# Patient Record
Sex: Female | Born: 1950 | Race: White | Hispanic: No | Marital: Married | Smoking: Former smoker
Health system: Southern US, Community
[De-identification: ages and names within clinical notes are randomized; demographics above are authoritative.]

## PROBLEM LIST (undated history)

## (undated) DIAGNOSIS — B49 Unspecified mycosis: Secondary | ICD-10-CM

## (undated) DIAGNOSIS — E039 Hypothyroidism, unspecified: Secondary | ICD-10-CM

## (undated) DIAGNOSIS — S199XXA Unspecified injury of neck, initial encounter: Secondary | ICD-10-CM

## (undated) DIAGNOSIS — M1990 Unspecified osteoarthritis, unspecified site: Secondary | ICD-10-CM

## (undated) DIAGNOSIS — F419 Anxiety disorder, unspecified: Secondary | ICD-10-CM

## (undated) DIAGNOSIS — J45909 Unspecified asthma, uncomplicated: Secondary | ICD-10-CM

## (undated) DIAGNOSIS — Z808 Family history of malignant neoplasm of other organs or systems: Secondary | ICD-10-CM

## (undated) DIAGNOSIS — F329 Major depressive disorder, single episode, unspecified: Secondary | ICD-10-CM

## (undated) DIAGNOSIS — E785 Hyperlipidemia, unspecified: Secondary | ICD-10-CM

## (undated) DIAGNOSIS — D899 Disorder involving the immune mechanism, unspecified: Secondary | ICD-10-CM

## (undated) DIAGNOSIS — Z800 Family history of malignant neoplasm of digestive organs: Secondary | ICD-10-CM

## (undated) DIAGNOSIS — Z803 Family history of malignant neoplasm of breast: Secondary | ICD-10-CM

## (undated) DIAGNOSIS — I499 Cardiac arrhythmia, unspecified: Secondary | ICD-10-CM

## (undated) DIAGNOSIS — C50919 Malignant neoplasm of unspecified site of unspecified female breast: Secondary | ICD-10-CM

## (undated) DIAGNOSIS — Z973 Presence of spectacles and contact lenses: Secondary | ICD-10-CM

## (undated) DIAGNOSIS — F32A Depression, unspecified: Secondary | ICD-10-CM

## (undated) HISTORY — DX: Unspecified mycosis: B49

## (undated) HISTORY — DX: Unspecified osteoarthritis, unspecified site: M19.90

## (undated) HISTORY — DX: Hyperlipidemia, unspecified: E78.5

## (undated) HISTORY — DX: Family history of malignant neoplasm of other organs or systems: Z80.8

## (undated) HISTORY — PX: OOPHORECTOMY: SHX86

## (undated) HISTORY — DX: Family history of malignant neoplasm of digestive organs: Z80.0

## (undated) HISTORY — DX: Family history of malignant neoplasm of breast: Z80.3

## (undated) HISTORY — DX: Malignant neoplasm of unspecified site of unspecified female breast: C50.919

## (undated) HISTORY — PX: TONSILLECTOMY AND ADENOIDECTOMY: SUR1326

## (undated) HISTORY — DX: Disorder involving the immune mechanism, unspecified: D89.9

---

## 1973-11-09 HISTORY — PX: CHOLECYSTECTOMY: SHX55

## 1981-11-09 HISTORY — PX: APPENDECTOMY: SHX54

## 2002-01-25 ENCOUNTER — Other Ambulatory Visit: Admission: RE | Admit: 2002-01-25 | Discharge: 2002-01-25 | Payer: Self-pay | Admitting: Obstetrics and Gynecology

## 2003-07-13 ENCOUNTER — Other Ambulatory Visit: Admission: RE | Admit: 2003-07-13 | Discharge: 2003-07-13 | Payer: Self-pay | Admitting: Obstetrics and Gynecology

## 2009-03-29 ENCOUNTER — Ambulatory Visit: Payer: Self-pay | Admitting: Gastroenterology

## 2009-04-05 ENCOUNTER — Encounter: Payer: Self-pay | Admitting: Gastroenterology

## 2009-04-05 ENCOUNTER — Ambulatory Visit: Payer: Self-pay | Admitting: Gastroenterology

## 2009-04-09 ENCOUNTER — Encounter: Payer: Self-pay | Admitting: Gastroenterology

## 2011-06-26 ENCOUNTER — Other Ambulatory Visit: Payer: Self-pay | Admitting: Family Medicine

## 2011-06-26 ENCOUNTER — Ambulatory Visit
Admission: RE | Admit: 2011-06-26 | Discharge: 2011-06-26 | Disposition: A | Discharge status: Home / Self Care | Payer: No Typology Code available for payment source | Source: Ambulatory Visit | Attending: Internal Medicine | Admitting: Internal Medicine

## 2011-06-26 DIAGNOSIS — T148XXA Other injury of unspecified body region, initial encounter: Secondary | ICD-10-CM

## 2012-11-09 DIAGNOSIS — Z85828 Personal history of other malignant neoplasm of skin: Secondary | ICD-10-CM

## 2012-11-09 HISTORY — DX: Personal history of other malignant neoplasm of skin: Z85.828

## 2013-01-25 ENCOUNTER — Other Ambulatory Visit: Payer: Self-pay | Admitting: Dermatology

## 2013-04-25 ENCOUNTER — Other Ambulatory Visit: Payer: Self-pay | Admitting: Internal Medicine

## 2013-04-25 ENCOUNTER — Ambulatory Visit
Admission: RE | Admit: 2013-04-25 | Discharge: 2013-04-25 | Disposition: A | Discharge status: Home / Self Care | Payer: No Typology Code available for payment source | Source: Ambulatory Visit | Attending: Internal Medicine | Admitting: Internal Medicine

## 2013-04-25 DIAGNOSIS — M545 Low back pain, unspecified: Secondary | ICD-10-CM

## 2013-11-01 ENCOUNTER — Ambulatory Visit (INDEPENDENT_AMBULATORY_CARE_PROVIDER_SITE_OTHER): Payer: BC Managed Care – PPO | Admitting: Family Medicine

## 2013-11-01 ENCOUNTER — Encounter: Payer: Self-pay | Admitting: Family Medicine

## 2013-11-01 ENCOUNTER — Other Ambulatory Visit (INDEPENDENT_AMBULATORY_CARE_PROVIDER_SITE_OTHER): Payer: BC Managed Care – PPO

## 2013-11-01 VITALS — BP 98/62 | HR 54 | Ht 68.0 in | Wt 131.0 lb

## 2013-11-01 DIAGNOSIS — M25569 Pain in unspecified knee: Secondary | ICD-10-CM

## 2013-11-01 DIAGNOSIS — S79919A Unspecified injury of unspecified hip, initial encounter: Secondary | ICD-10-CM

## 2013-11-01 DIAGNOSIS — S76302A Unspecified injury of muscle, fascia and tendon of the posterior muscle group at thigh level, left thigh, initial encounter: Secondary | ICD-10-CM | POA: Insufficient documentation

## 2013-11-01 DIAGNOSIS — M9981 Other biomechanical lesions of cervical region: Secondary | ICD-10-CM

## 2013-11-01 DIAGNOSIS — M999 Biomechanical lesion, unspecified: Secondary | ICD-10-CM | POA: Insufficient documentation

## 2013-11-01 DIAGNOSIS — M25562 Pain in left knee: Secondary | ICD-10-CM

## 2013-11-01 NOTE — Assessment & Plan Note (Signed)
Decision today to treat with OMT was based on Physical Exam  After verbal consent patient was treated with HVLA, ME techniques in cervical, thoracic, and lumbar areas  Patient tolerated the procedure well with improvement in symptoms  Patient given exercises, stretches and lifestyle modifications  See medications in patient instructions if given  Patient will follow up in 6 weeks 

## 2013-11-01 NOTE — Assessment & Plan Note (Signed)
Discussed with patient at great length. Patient will do more of a home exercise program. We are going to focus on Askling exercises. Discuss strengthening hip abductor's as well the  Discuss compression and ice protocol Discuss crosstraining with biking to help with quadricep strength. Patient followup on an as-needed basis.

## 2013-11-01 NOTE — Assessment & Plan Note (Signed)
Decision today to treat with OMT was based on Physical Exam  After verbal consent patient was treated with HVLA, ME techniques in cervical, thoracic, and lumbar areas  Patient tolerated the procedure well with improvement in symptoms  Patient given exercises, stretches and lifestyle modifications  See medications in patient instructions if given  Patient will follow up in 6 weeks

## 2013-11-01 NOTE — Patient Instructions (Signed)
Great to see you!!!! Tell everyone hi! Try askling exercises for your hamstring.  Compression of knee sleeve could help while running. [email or web address] medium knee sleeve. Wear with activity and 30 minutes afterward.  Ice 20 minutes within first hour after activity Continue aleve.  Work on hip abductor exercises.  Your sister is the drill sargent.  See me when you need me Happy New Year!

## 2013-11-01 NOTE — Progress Notes (Signed)
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 Patient states that she started running again and started to have some mild discomfort at the end of different 5K races. Patient has been 3 and has been improving her time. Patient is also increase her mileage per week. Patient has not been doing as much weight training secondary to this. Patient states the pain is mostly in the posterior aspect of the knee and did have a very sharp sensation when it first immediately occurred. Patient denies any swelling, denies any radiation of pain or any numbness. Patient does state that she can have mechanical symptoms such as clicking but this seems to be nonpainful. Denies weakness of the lower externally. Patient puts the discomfort as 5/10.  I have seen the patient previously in other offices for osteopathic manipulation. Patient would like to be evaluated. 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 He should has undergone multiple different treatments including the most recent of prolotherapy. Patient states that this has helped mostly. Does have some mild discomfort of the lower back after running. Has been doing multiple natural herbs.   Past medical, surgical, family and social history reviewed. Medications reviewed all in the electronic medical record.   Review of Systems: No headache, visual changes, nausea, vomiting, diarrhea, constipation, dizziness, abdominal pain, skin rash, fevers, chills, night sweats, weight loss, swollen lymph nodes, body aches, joint swelling, muscle aches, chest pain, shortness of breath, mood changes.   Objective:    Blood pressure 98/62, pulse 54, weight 131 lb (59.421 kg), SpO2 97.00%.   General: No apparent distress alert and oriented x3 mood and affect normal, dressed appropriately.  HEENT: Pupils equal,  extraocular movements intact Respiratory: Patient's speak in full sentences and does not appear short of breath Cardiovascular: No lower extremity edema, non tender, no erythema Skin: Warm dry intact with no signs of infection or rash on extremities or on axial skeleton. Abdomen: Soft nontender Neuro: Cranial nerves II through XII are intact, neurovascularly intact in all extremities with 2+ DTRs and 2+ pulses. Lymph: No lymphadenopathy of posterior or anterior cervical chain or axillae bilaterally.  Gait running gait shows that patient's left knee does cross midline showing hip abductor weakening. MSK: Non tender with full range of motion and good stability and symmetric strength and tone of shoulders, elbows, wrist, hip, and ankles bilaterally.  Knee: Left Normal to inspection with no erythema or effusion or obvious bony abnormalities. Palpation normal with no warmth, joint line tenderness, patellar tenderness, or condyle tenderness. Patient does have minimal tenderness over the medial hamstring tendon insertion ROM full in flexion and extension and lower leg rotation. Ligaments with solid consistent endpoints including ACL, PCL, LCL, MCL. Negative Mcmurray's, Apley's, and Thessalonian tests. painful patellar compression. Patellar glide with moderate crepitus. Patellar and quadriceps tendons unremarkable. Hamstring and quadriceps strength is normal. Patient though does have some mild atrophy of the VMO bilaterally Contralateral knee unremarkable  MSK US performed of: Right knee This study was ordered, performed, and interpreted by Terrilee Files D.O.  Knee: All structures visualized. anterolateral, posteromedial, and posterolateral menisci unremarkable without tearing, fraying, effusion, or displacement. Anterior medial meniscus does have a degenerative tear with mild displacement Patellar Tendon unremarkable on long and transverse views without effusion. No abnormality of prepatellar  bursa. LCL and MCL unremarkable on long and transverse views.  No abnormality of origin of medial or lateral head of the gastrocnemius. Distal hamstring on the medial aspect of does have some mild hypoechoic changes and possibly some scar tissue formation that seems to be resolving. There is increased Doppler flow in this area.  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

## 2015-06-12 ENCOUNTER — Encounter: Payer: Self-pay | Admitting: Gastroenterology

## 2016-09-09 DIAGNOSIS — E538 Deficiency of other specified B group vitamins: Secondary | ICD-10-CM | POA: Diagnosis not present

## 2016-09-09 DIAGNOSIS — Z79890 Hormone replacement therapy (postmenopausal): Secondary | ICD-10-CM | POA: Diagnosis not present

## 2016-09-09 DIAGNOSIS — E782 Mixed hyperlipidemia: Secondary | ICD-10-CM | POA: Diagnosis not present

## 2016-09-09 DIAGNOSIS — Z79899 Other long term (current) drug therapy: Secondary | ICD-10-CM | POA: Diagnosis not present

## 2016-09-09 DIAGNOSIS — E039 Hypothyroidism, unspecified: Secondary | ICD-10-CM | POA: Diagnosis not present

## 2016-09-09 DIAGNOSIS — E559 Vitamin D deficiency, unspecified: Secondary | ICD-10-CM | POA: Diagnosis not present

## 2016-09-16 DIAGNOSIS — E039 Hypothyroidism, unspecified: Secondary | ICD-10-CM | POA: Diagnosis not present

## 2016-09-16 DIAGNOSIS — E559 Vitamin D deficiency, unspecified: Secondary | ICD-10-CM | POA: Diagnosis not present

## 2016-09-16 DIAGNOSIS — E782 Mixed hyperlipidemia: Secondary | ICD-10-CM | POA: Diagnosis not present

## 2016-09-16 DIAGNOSIS — E538 Deficiency of other specified B group vitamins: Secondary | ICD-10-CM | POA: Diagnosis not present

## 2016-09-16 DIAGNOSIS — Z5181 Encounter for therapeutic drug level monitoring: Secondary | ICD-10-CM | POA: Diagnosis not present

## 2016-11-03 DIAGNOSIS — C44519 Basal cell carcinoma of skin of other part of trunk: Secondary | ICD-10-CM | POA: Diagnosis not present

## 2016-11-03 DIAGNOSIS — D225 Melanocytic nevi of trunk: Secondary | ICD-10-CM | POA: Diagnosis not present

## 2016-11-03 DIAGNOSIS — D485 Neoplasm of uncertain behavior of skin: Secondary | ICD-10-CM | POA: Diagnosis not present

## 2016-11-03 DIAGNOSIS — D1800 Hemangioma unspecified site: Secondary | ICD-10-CM | POA: Diagnosis not present

## 2016-11-03 DIAGNOSIS — Z85828 Personal history of other malignant neoplasm of skin: Secondary | ICD-10-CM | POA: Diagnosis not present

## 2016-11-03 DIAGNOSIS — Z23 Encounter for immunization: Secondary | ICD-10-CM | POA: Diagnosis not present

## 2016-11-03 DIAGNOSIS — L821 Other seborrheic keratosis: Secondary | ICD-10-CM | POA: Diagnosis not present

## 2016-11-04 DIAGNOSIS — G479 Sleep disorder, unspecified: Secondary | ICD-10-CM | POA: Diagnosis not present

## 2016-11-04 DIAGNOSIS — R5383 Other fatigue: Secondary | ICD-10-CM | POA: Diagnosis not present

## 2016-11-04 DIAGNOSIS — R5381 Other malaise: Secondary | ICD-10-CM | POA: Diagnosis not present

## 2016-11-04 DIAGNOSIS — K5900 Constipation, unspecified: Secondary | ICD-10-CM | POA: Diagnosis not present

## 2016-11-04 DIAGNOSIS — E619 Deficiency of nutrient element, unspecified: Secondary | ICD-10-CM | POA: Diagnosis not present

## 2016-11-04 DIAGNOSIS — K589 Irritable bowel syndrome without diarrhea: Secondary | ICD-10-CM | POA: Diagnosis not present

## 2016-11-04 DIAGNOSIS — K5229 Other allergic and dietetic gastroenteritis and colitis: Secondary | ICD-10-CM | POA: Diagnosis not present

## 2016-11-05 DIAGNOSIS — Z77120 Contact with and (suspected) exposure to mold (toxic): Secondary | ICD-10-CM | POA: Diagnosis not present

## 2016-11-05 DIAGNOSIS — F419 Anxiety disorder, unspecified: Secondary | ICD-10-CM | POA: Diagnosis not present

## 2016-11-05 DIAGNOSIS — G479 Sleep disorder, unspecified: Secondary | ICD-10-CM | POA: Diagnosis not present

## 2016-11-05 DIAGNOSIS — J329 Chronic sinusitis, unspecified: Secondary | ICD-10-CM | POA: Diagnosis not present

## 2016-11-06 DIAGNOSIS — F419 Anxiety disorder, unspecified: Secondary | ICD-10-CM | POA: Diagnosis not present

## 2016-11-06 DIAGNOSIS — J329 Chronic sinusitis, unspecified: Secondary | ICD-10-CM | POA: Diagnosis not present

## 2016-11-06 DIAGNOSIS — Z77120 Contact with and (suspected) exposure to mold (toxic): Secondary | ICD-10-CM | POA: Diagnosis not present

## 2016-11-06 DIAGNOSIS — G479 Sleep disorder, unspecified: Secondary | ICD-10-CM | POA: Diagnosis not present

## 2016-11-09 DIAGNOSIS — Z8619 Personal history of other infectious and parasitic diseases: Secondary | ICD-10-CM

## 2016-11-09 DIAGNOSIS — B49 Unspecified mycosis: Secondary | ICD-10-CM

## 2016-11-09 HISTORY — DX: Personal history of other infectious and parasitic diseases: Z86.19

## 2016-11-09 HISTORY — DX: Unspecified mycosis: B49

## 2016-11-24 NOTE — Progress Notes (Deleted)
  [name] [address] [place], [place] [address] Phone: [phone] Subjective:    I'm seeing this patient by the request  of:    CC:   [id number]  Whitney Jokela Md is a 66 y.o. female coming in with complaint of ***     Past Medical History:  Diagnosis Date  . Arthritis    Past Surgical History:  Procedure Laterality Date  . APPENDECTOMY  1983  . CHOLECYSTECTOMY  1975  . TONSILLECTOMY AND ADENOIDECTOMY     Social History   Social History  . Marital status: Divorced    Spouse name: N/A  . Number of children: N/A  . Years of education: N/A   Social History Main Topics  . Smoking status: Former Research scientist (life sciences)  . Smokeless tobacco: Never Used  . Alcohol use Yes  . Drug use: No  . Sexual activity: Not on file   Other Topics Concern  . Not on file   Social History Narrative  . No narrative on file   Allergies  Allergen Reactions  . Sulfonamide Derivatives     REACTION: rash   Family History  Problem Relation Age of Onset  . Cancer Mother   . Hyperlipidemia Mother   . Cancer Father   . Hyperlipidemia Father   . Birth defects Maternal Grandmother   . Birth defects Paternal Grandmother     Past medical history, social, surgical and family history all reviewed in electronic medical record.  No pertanent information unless stated regarding to the chief complaint.   Review of Systems:Review of systems updated and as accurate as of 11/24/16  No headache, visual changes, nausea, vomiting, diarrhea, constipation, dizziness, abdominal pain, skin rash, fevers, chills, night sweats, weight loss, swollen lymph nodes, body aches, joint swelling, muscle aches, chest pain, shortness of breath, mood changes.   Objective  There were no vitals taken for this visit. Systems examined below as of 11/24/16   General: No apparent distress alert and oriented x3 mood and affect normal, dressed appropriately.  HEENT: Pupils equal, extraocular  movements intact  Respiratory: Patient's speak in full sentences and does not appear short of breath  Cardiovascular: No lower extremity edema, non tender, no erythema  Skin: Warm dry intact with no signs of infection or rash on extremities or on axial skeleton.  Abdomen: Soft nontender  Neuro: Cranial nerves II through XII are intact, neurovascularly intact in all extremities with 2+ DTRs and 2+ pulses.  Lymph: No lymphadenopathy of posterior or anterior cervical chain or axillae bilaterally.  Gait normal with good balance and coordination.  MSK:  Non tender with full range of motion and good stability and symmetric strength and tone of shoulders, elbows, wrist, hip, knee and ankles bilaterally.     Impression and Recommendations:     This case required medical decision making of moderate complexity.      Note: This dictation was prepared with Dragon dictation along with smaller phrase technology. Any transcriptional errors that result from this process are unintentional.

## 2016-11-25 ENCOUNTER — Telehealth: Payer: Self-pay | Admitting: Family Medicine

## 2016-11-25 ENCOUNTER — Ambulatory Visit: Payer: Self-pay | Admitting: Family Medicine

## 2016-11-25 NOTE — Telephone Encounter (Signed)
Can we work in sooner?  Had to reschedule b/c of weather to next wed.

## 2016-11-27 NOTE — Telephone Encounter (Signed)
Unfortunately we do not have any sooner openings.
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
## 2016-12-02 ENCOUNTER — Encounter: Payer: Self-pay | Admitting: Family Medicine

## 2016-12-02 ENCOUNTER — Ambulatory Visit: Payer: Self-pay | Admitting: Family Medicine

## 2016-12-02 ENCOUNTER — Ambulatory Visit (INDEPENDENT_AMBULATORY_CARE_PROVIDER_SITE_OTHER): Payer: PPO | Admitting: Family Medicine

## 2016-12-02 VITALS — BP 110/78 | HR 59 | Wt 124.0 lb

## 2016-12-02 DIAGNOSIS — M9982 Other biomechanical lesions of thoracic region: Secondary | ICD-10-CM

## 2016-12-02 DIAGNOSIS — M999 Biomechanical lesion, unspecified: Secondary | ICD-10-CM

## 2016-12-02 DIAGNOSIS — M545 Low back pain, unspecified: Secondary | ICD-10-CM | POA: Insufficient documentation

## 2016-12-02 DIAGNOSIS — G8929 Other chronic pain: Secondary | ICD-10-CM

## 2016-12-02 NOTE — Patient Instructions (Addendum)
GREAT to see you! xrays look good.  Have a great trip Exercises 3 times a week.  Turmeric 500mg  twice daily  Tart cherry extract any dose at night could help as well.  Keep being active and take some time for yourself.  See me again in 4 weeks.

## 2016-12-02 NOTE — Assessment & Plan Note (Signed)
Decision today to treat with OMT was based on Physical Exam  After verbal consent patient was treated with HVLA, ME, FPR techniques in cervical, thoracic, lumbar and sacral areas  Patient tolerated the procedure well with improvement in symptoms  Patient given exercises, stretches and lifestyle modifications  See medications in patient instructions if given  Patient will follow up in 4 weeks 

## 2016-12-02 NOTE — Assessment & Plan Note (Signed)
Patient is a more low back pain. We discussed icing regimen and home exercises. We discussed which activities to do a which was to avoid. Patient will start home exercises and work with athletic trainer to learn him in greater detail. Patient has responded very well to a supine manipulation. We discussed other things regular. Follow-up with me again in 4 weeks.

## 2016-12-21 DIAGNOSIS — C44519 Basal cell carcinoma of skin of other part of trunk: Secondary | ICD-10-CM | POA: Diagnosis not present

## 2017-01-06 ENCOUNTER — Other Ambulatory Visit (INDEPENDENT_AMBULATORY_CARE_PROVIDER_SITE_OTHER): Payer: PPO

## 2017-01-06 ENCOUNTER — Encounter: Payer: Self-pay | Admitting: Family Medicine

## 2017-01-06 ENCOUNTER — Ambulatory Visit (INDEPENDENT_AMBULATORY_CARE_PROVIDER_SITE_OTHER): Payer: PPO | Admitting: Family Medicine

## 2017-01-06 VITALS — BP 102/60 | HR 52 | Ht 68.0 in | Wt 128.0 lb

## 2017-01-06 DIAGNOSIS — M999 Biomechanical lesion, unspecified: Secondary | ICD-10-CM

## 2017-01-06 DIAGNOSIS — G8929 Other chronic pain: Secondary | ICD-10-CM

## 2017-01-06 DIAGNOSIS — E038 Other specified hypothyroidism: Secondary | ICD-10-CM

## 2017-01-06 DIAGNOSIS — M545 Low back pain, unspecified: Secondary | ICD-10-CM

## 2017-01-06 LAB — FERRITIN: FERRITIN: 48.5 ng/mL (ref 10.0–291.0)

## 2017-01-06 LAB — T3, FREE: T3 FREE: 2.4 pg/mL (ref 2.3–4.2)

## 2017-01-06 LAB — T4, FREE: Free T4: 0.63 ng/dL (ref 0.60–1.60)

## 2017-01-06 LAB — TSH: TSH: 0.5 u[IU]/mL (ref 0.35–4.50)

## 2017-01-06 NOTE — Patient Instructions (Signed)
Good to see you  Work on the hip flexor especially after running Keep up with my exercises 3 times a week.  We ill get labs today downstairs.  Keep it up  See me again in 4 weeks.
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
## 2017-01-06 NOTE — Assessment & Plan Note (Signed)
Patient does have tenderness to palpation and appears palmar musculature of the lumbar spine. Patient responded very well to manipulation again. Patient is having some chronic pain. Labs ordered today. We'll make sure that patient's thyroid is within normal limits. We discussed icing regimen. Patient will come back and see me again 4 weeks.

## 2017-01-06 NOTE — Assessment & Plan Note (Signed)
Decision today to treat with OMT was based on Physical Exam  After verbal consent patient was treated with HVLA, ME, FPR techniques in cervical, thoracic, lumbar and sacral areas  Patient tolerated the procedure well with improvement in symptoms  Patient given exercises, stretches and lifestyle modifications  See medications in patient instructions if given  Patient will follow up in 4 weeks 

## 2017-01-09 LAB — T3, REVERSE: T3, Reverse: 10 ng/dL (ref 8–25)

## 2017-01-11 NOTE — Progress Notes (Unsigned)
Labs look perfect

## 2017-02-02 NOTE — Progress Notes (Signed)
[name] [place], [place] [address] Phone: [phone] Subjective:     CC: low back and neck pain f/u   [id number]  Whitney Bubolz Md is a 66 y.o. female coming in with complaint of Low back and neck pain. Doing relatively well with conservative therapy. Continue to work on the hip flexors. States his long as she does the stretches he seems to do relatively better. Continues to be active. Continues to increase her running.       Past Medical History:  Diagnosis Date  . Arthritis    Past Surgical History:  Procedure Laterality Date  . APPENDECTOMY  1983  . CHOLECYSTECTOMY  1975  . TONSILLECTOMY AND ADENOIDECTOMY     Social History   Social History  . Marital status: Divorced    Spouse name: N/A  . Number of children: N/A  . Years of education: N/A   Social History Main Topics  . Smoking status: Former Research scientist (life sciences)  . Smokeless tobacco: Never Used  . Alcohol use Yes  . Drug use: No  . Sexual activity: Not Asked   Other Topics Concern  . None   Social History Narrative  . None   Allergies  Allergen Reactions  . Sulfonamide Derivatives     REACTION: rash   Family History  Problem Relation Age of Onset  . Cancer Mother   . Hyperlipidemia Mother   . Cancer Father   . Hyperlipidemia Father   . Birth defects Maternal Grandmother   . Birth defects Paternal Grandmother     Past medical history, social, surgical and family history all reviewed in electronic medical record.  No pertanent information unless stated regarding to the chief complaint.   Review of Systems: No headache, visual changes, nausea, vomiting, diarrhea, constipation, dizziness, abdominal pain, skin rash, fevers, chills, night sweats, weight loss, swollen lymph nodes, body aches, joint swelling, muscle aches, chest pain, shortness of breath, mood changes.    Objective  Blood pressure 96/62, pulse (!) 55, height 5\' 8"  (1.727 m), weight 127 lb  (57.6 kg), SpO2 98 %.    Systems examined below as of 02/03/17 General: NAD A&O x3 mood, affect normal  HEENT: Pupils equal, extraocular movements intact no nystagmus Respiratory: not short of breath at rest or with speaking Cardiovascular: No lower extremity edema, non tender Skin: Warm dry intact with no signs of infection or rash on extremities or on axial skeleton. Abdomen: Soft nontender, no masses Neuro: Cranial nerves  intact, neurovascularly intact in all extremities with 2+ DTRs and 2+ pulses. Lymph: No lymphadenopathy appreciated today  Gait normal with good balance and coordination.  MSK: Non tender with full range of motion and good stability and symmetric strength and tone of shoulders, elbows, wrist,  knee hips and ankles bilaterally.   Back Exam:  Inspection: Unremarkable  Motion: Flexion 40 deg, Extension 15 deg, Side Bending to 25 deg bilaterally,  Rotation to 25 deg bilaterally  SLR laying: Negative  XSLR laying: Negative  Palpable tenderness: Increasing tenderness and flexors bilaterally FABER: Positive right. Sensory change: Gross sensation intact to all lumbar and sacral dermatomes.  Reflexes: 2+ at both patellar tendons, 2+ at achilles tendons, Babinski's downgoing.  Strength at foot  Plantar-flexion: 5/5 Dorsi-flexion: 5/5 Eversion: 5/5 Inversion: 5/5  Leg strength  Quad: 5/5 Hamstring: 5/5 Hip flexor: 5/5 Hip abductors: 4/5 but symmetric. Gait unremarkable.  Neck: Inspection unremarkable. No palpable stepoffs. Negative Spurling's maneuver. Continue mild limitation in range of motion  in all planes Grip strength and sensation normal in bilateral hands Strength good C4 to T1 distribution No sensory change to C4 to T1 Negative Hoffman sign bilaterally Reflexes normal  Osteopathic findings Cervical C2 flexed rotated and side bent right C6 flexed rotated and side bent left T3 extended rotated and side bent right inhaled third rib T9 extended rotated and  side bent left L3 flexed rotated and side bent right Sacrum right on right     Impression and Recommendations:     This case required medical decision making of moderate complexity.      Note: This dictation was prepared with Dragon dictation along with smaller phrase technology. Any transcriptional errors that result from this process are unintentional.

## 2017-02-03 ENCOUNTER — Ambulatory Visit (INDEPENDENT_AMBULATORY_CARE_PROVIDER_SITE_OTHER): Payer: PPO | Admitting: Family Medicine

## 2017-02-03 ENCOUNTER — Encounter: Payer: Self-pay | Admitting: Family Medicine

## 2017-02-03 VITALS — BP 96/62 | HR 55 | Ht 68.0 in | Wt 127.0 lb

## 2017-02-03 DIAGNOSIS — M545 Low back pain: Secondary | ICD-10-CM | POA: Diagnosis not present

## 2017-02-03 DIAGNOSIS — G8929 Other chronic pain: Secondary | ICD-10-CM

## 2017-02-03 DIAGNOSIS — M999 Biomechanical lesion, unspecified: Secondary | ICD-10-CM | POA: Diagnosis not present

## 2017-02-03 NOTE — Assessment & Plan Note (Signed)
Stable overall. I do not feel that we can decrease frequency of office visits at this time. Increase exercises to focus more on the core stability. We discussed objective is to do in which ones to avoid. Patient should do fairly well we'll still. Follow-up again in 4 weeks

## 2017-02-03 NOTE — Assessment & Plan Note (Signed)
Decision today to treat with OMT was based on Physical Exam  After verbal consent patient was treated with HVLA, ME, FPR techniques in cervical, thoracic, lumbar and sacral areas  Patient tolerated the procedure well with improvement in symptoms  Patient given exercises, stretches and lifestyle modifications  See medications in patient instructions if given  Patient will follow up in 4 weeks 

## 2017-02-03 NOTE — Patient Instructions (Signed)
Good to see you  Alvera Singh is your friend.  Try to watch the diet  Stay active Exercises is great  Bromelain and tart cherry extract  See me again in 4 weeks

## 2017-03-09 ENCOUNTER — Other Ambulatory Visit: Payer: Self-pay

## 2017-03-09 DIAGNOSIS — M8580 Other specified disorders of bone density and structure, unspecified site: Secondary | ICD-10-CM

## 2017-03-10 ENCOUNTER — Ambulatory Visit (INDEPENDENT_AMBULATORY_CARE_PROVIDER_SITE_OTHER)
Admission: RE | Admit: 2017-03-10 | Discharge: 2017-03-10 | Disposition: A | Discharge status: Home / Self Care | Payer: PPO | Source: Ambulatory Visit | Attending: Family Medicine | Admitting: Family Medicine

## 2017-03-10 ENCOUNTER — Ambulatory Visit (INDEPENDENT_AMBULATORY_CARE_PROVIDER_SITE_OTHER): Payer: PPO | Admitting: Family Medicine

## 2017-03-10 ENCOUNTER — Encounter: Payer: Self-pay | Admitting: Family Medicine

## 2017-03-10 ENCOUNTER — Other Ambulatory Visit (INDEPENDENT_AMBULATORY_CARE_PROVIDER_SITE_OTHER): Payer: PPO

## 2017-03-10 VITALS — BP 98/54 | HR 74 | Resp 16 | Ht 66.0 in | Wt 127.1 lb

## 2017-03-10 DIAGNOSIS — M8580 Other specified disorders of bone density and structure, unspecified site: Secondary | ICD-10-CM | POA: Diagnosis not present

## 2017-03-10 DIAGNOSIS — M999 Biomechanical lesion, unspecified: Secondary | ICD-10-CM | POA: Diagnosis not present

## 2017-03-10 DIAGNOSIS — M85861 Other specified disorders of bone density and structure, right lower leg: Secondary | ICD-10-CM | POA: Diagnosis not present

## 2017-03-10 DIAGNOSIS — G8929 Other chronic pain: Secondary | ICD-10-CM | POA: Diagnosis not present

## 2017-03-10 DIAGNOSIS — M545 Low back pain, unspecified: Secondary | ICD-10-CM

## 2017-03-10 LAB — VITAMIN D 25 HYDROXY (VIT D DEFICIENCY, FRACTURES): VITD: 95.4 ng/mL (ref 30.00–100.00)

## 2017-03-10 NOTE — Progress Notes (Signed)
Pre-visit discussion using our clinic review tool. No additional management support is needed unless otherwise documented below in the visit note.  

## 2017-03-10 NOTE — Progress Notes (Signed)
[name] [place], [place] [address] Phone: [phone] Subjective:     CC: low back and neck pain f/u   [id number]  Whitney Antrobus Md is a 66 y.o. female coming in with complaint of Low back and neck pain. Patient was doing well. Patient though unfortunately no longer was following her diet and stopped exercises for the last 3 weeks. Has noticed increasing back pain, and multiple other joint pains. Asian states that it is a dull, throbbing aching sensation overall. Patient is having worsening symptoms that does not allow her to be active. Patient denies any fevers chills or any abnormal weight loss.        Past Medical History:  Diagnosis Date  . Arthritis    Past Surgical History:  Procedure Laterality Date  . APPENDECTOMY  1983  . CHOLECYSTECTOMY  1975  . TONSILLECTOMY AND ADENOIDECTOMY     Social History   Social History  . Marital status: Divorced    Spouse name: N/A  . Number of children: N/A  . Years of education: N/A   Social History Main Topics  . Smoking status: Former Research scientist (life sciences)  . Smokeless tobacco: Never Used  . Alcohol use Yes  . Drug use: No  . Sexual activity: Not Asked   Other Topics Concern  . None   Social History Narrative  . None   Allergies  Allergen Reactions  . Sulfonamide Derivatives     REACTION: rash   Family History  Problem Relation Age of Onset  . Cancer Mother   . Hyperlipidemia Mother   . Cancer Father   . Hyperlipidemia Father   . Birth defects Maternal Grandmother   . Birth defects Paternal Grandmother     Past medical history, social, surgical and family history all reviewed in electronic medical record.  No pertanent information unless stated regarding to the chief complaint.   Review of Systems: No headache, visual changes, nausea, vomiting, diarrhea, constipation, dizziness, abdominal pain, skin rash, fevers, chills, night sweats, weight loss, swollen lymph nodes,  chest pain, shortness of breath, mood changes.  Positive muscle and joint pain  Objective  Blood pressure (!) 98/54, pulse 74, resp. rate 16, height 5\' 6"  (1.676 m), weight 127 lb 2 oz (57.7 kg), SpO2 98 %.    Systems examined below as of 03/10/17 General: NAD A&O x3 mood, affect normal  HEENT: Pupils equal, extraocular movements intact no nystagmus Respiratory: not short of breath at rest or with speaking Cardiovascular: No lower extremity edema, non tender Skin: Warm dry intact with no signs of infection or rash on extremities or on axial skeleton. Abdomen: Soft nontender, no masses Neuro: Cranial nerves  intact, neurovascularly intact in all extremities with 2+ DTRs and 2+ pulses. Lymph: No lymphadenopathy appreciated today  Gait normal with good balance and coordination.  MSK: Non tender with full range of motion and good stability and symmetric strength and tone of shoulders, elbows, wrist,  knee hips and ankles bilaterally.   Back Exam:  Inspection: Unremarkable  Motion: Flexion 40 deg, Extension 25 deg, Side Bending to 40 deg bilaterally,  Rotation to 40 deg bilaterally  SLR laying: Negative  XSLR laying: Negative  Palpable tenderness: Tender to palpation in the paraspinal musculature of the lumbar spine. Very minimal scoliosis noted. FABER: negative. Sensory change: Gross sensation intact to all lumbar and sacral dermatomes.  Reflexes: 2+ at both patellar tendons, 2+ at achilles tendons, Babinski's downgoing.  Strength at foot  Plantar-flexion:  5/5 Dorsi-flexion: 5/5 Eversion: 5/5 Inversion: 5/5  Leg strength  Quad: 5/5 Hamstring: 5/5 Hip flexor: 5/5 Hip abductors: 5/5  Gait unremarkable.   Neck: Inspection unremarkable. No palpable stepoffs. Negative Spurling's maneuver. Full neck range of motion patient does have some pain though in the terminal Grip strength and sensation normal in bilateral hands Strength good C4 to T1 distribution No sensory change to C4 to  T1 Negative Hoffman sign bilaterally Reflexes normal  Osteopathic findings Cervical C2 flexed rotated and side bent right C6 flexed rotated and side bent left T3 extended rotated and side bent right inhaled third rib T9 extended rotated and side bent left L3 flexed rotated and side bent right Sacrum right on right     Impression and Recommendations:     This case required medical decision making of moderate complexity.      Note: This dictation was prepared with Dragon dictation along with smaller phrase technology. Any transcriptional errors that result from this process are unintentional.

## 2017-03-10 NOTE — Patient Instructions (Signed)
awesome overall  Have fun in [place] See me again in 4 weeks.

## 2017-03-10 NOTE — Assessment & Plan Note (Signed)
Decision today to treat with OMT was based on Physical Exam  After verbal consent patient was treated with HVLA, ME, FPR techniques in cervical, thoracic, lumbar and sacral areas  Patient tolerated the procedure well with improvement in symptoms  Patient given exercises, stretches and lifestyle modifications  See medications in patient instructions if given  Patient will follow up in 4 weeks 

## 2017-03-10 NOTE — Assessment & Plan Note (Signed)
Patient low back pain seems to be stable but some mild worsening at this point. Patient has not been doing the exercises as regularly. Patient will start working on a more regular basis. She will be traveling over the course the next several days and then likely will cause some exacerbation and patient given some guidance. Patient though otherwise follow-up with me again in 4 weeks

## 2017-03-26 ENCOUNTER — Encounter: Payer: Self-pay | Admitting: Family Medicine

## 2017-03-26 ENCOUNTER — Ambulatory Visit (INDEPENDENT_AMBULATORY_CARE_PROVIDER_SITE_OTHER): Payer: PPO | Admitting: Family Medicine

## 2017-03-26 VITALS — BP 92/60 | HR 53 | Ht 66.0 in | Wt 124.0 lb

## 2017-03-26 DIAGNOSIS — M545 Low back pain: Secondary | ICD-10-CM | POA: Diagnosis not present

## 2017-03-26 DIAGNOSIS — G8929 Other chronic pain: Secondary | ICD-10-CM | POA: Diagnosis not present

## 2017-03-26 DIAGNOSIS — M999 Biomechanical lesion, unspecified: Secondary | ICD-10-CM

## 2017-03-26 NOTE — Assessment & Plan Note (Signed)
Patient does have some low back pain. We discussed core strengthening. Patient has been running and we will have her decrease and for some time. We discussed stretching on a more regular basis. We discussed hydration. Patient will follow-up with me again in 2-4 weeks.

## 2017-03-26 NOTE — Assessment & Plan Note (Signed)
Decision today to treat with OMT was based on Physical Exam  After verbal consent patient was treated with HVLA, ME, FPR techniques in cervical, thoracic, lumbar and sacral areas  Patient tolerated the procedure well with improvement in symptoms  Patient given exercises, stretches and lifestyle modifications  See medications in patient instructions if given  Patient will follow up in 4 weeks I feel that this was just an exacerbation. Patient will make no significant changes in management. We did discuss different things to avoid.

## 2017-03-26 NOTE — Progress Notes (Signed)
[name] [place], [place] [address] Phone: [phone] Subjective:     CC: low back and neck pain f/u  [id number]  Whitney Delisi Md is a 66 y.o. female coming in with complaint of Low back and neck pain. Patient is having worsening symptoms. Was doing a lot more activity outdoors recently. Patient was in a flexed city good position for a long amount of time or he thinks that this increased the amount of discomfort in the neck and back. Patient denies any radiation to any of the extremities. Was very uncomfortable even at night.        Past Medical History:  Diagnosis Date  . Arthritis    Past Surgical History:  Procedure Laterality Date  . APPENDECTOMY  1983  . CHOLECYSTECTOMY  1975  . TONSILLECTOMY AND ADENOIDECTOMY     Social History   Social History  . Marital status: Divorced    Spouse name: N/A  . Number of children: N/A  . Years of education: N/A   Social History Main Topics  . Smoking status: Former Research scientist (life sciences)  . Smokeless tobacco: Never Used  . Alcohol use Yes  . Drug use: No  . Sexual activity: Not Asked   Other Topics Concern  . None   Social History Narrative  . None   Allergies  Allergen Reactions  . Sulfonamide Derivatives     REACTION: rash   Family History  Problem Relation Age of Onset  . Cancer Mother   . Hyperlipidemia Mother   . Cancer Father   . Hyperlipidemia Father   . Birth defects Maternal Grandmother   . Birth defects Paternal Grandmother     Past medical history, social, surgical and family history all reviewed in electronic medical record.  No pertanent information unless stated regarding to the chief complaint.   Review of Systems: No headache, visual changes, nausea, vomiting, diarrhea, constipation, dizziness, abdominal pain, skin rash, fevers, chills, night sweats, weight loss, swollen lymph nodes, chest pain, shortness of breath, mood changes.  Positive muscle and joint  pain  Objective  Blood pressure 92/60, pulse (!) 53, height 5\' 6"  (1.676 m), weight 124 lb (56.2 kg), SpO2 97 %.  Systems examined below as of 03/26/17 General: NAD A&O x3 mood, affect normal  HEENT: Pupils equal, extraocular movements intact no nystagmus Respiratory: not short of breath at rest or with speaking Cardiovascular: No lower extremity edema, non tender Skin: Warm dry intact with no signs of infection or rash on extremities or on axial skeleton. Abdomen: Soft nontender, no masses Neuro: Cranial nerves  intact, neurovascularly intact in all extremities with 2+ DTRs and 2+ pulses. Lymph: No lymphadenopathy appreciated today  Gait normal with good balance and coordination.  MSK: Non tender with full range of motion and good stability and symmetric strength and tone of shoulders, elbows, wrist,  knee hips and ankles bilaterally.  Mild arthritic changes of multiple joints Back Exam:  Inspection: Unremarkable  Motion: Flexion 40 deg, Extension 15 deg, Side Bending to 40 deg bilaterally,  Rotation to 30 deg bilaterally increasing tightness from previous exam SLR laying: Negative  XSLR laying: Negative  Palpable tenderness: Continued tenderness in the paraspinal musculature mostly in the lumbosacral area. Seems to radiate to the quadratus lumborum FABER: negative. Sensory change: Gross sensation intact to all lumbar and sacral dermatomes.  Reflexes: 2+ at both patellar tendons, 2+ at achilles tendons, Babinski's downgoing.  Strength at foot  5 out of 5 strength  and symmetric   Neck: Inspection unremarkable. No palpable stepoffs. Negative Spurling's maneuver. Limited right-sided rotation by 5 Grip strength and sensation normal in bilateral hands Strength good C4 to T1 distribution No sensory change to C4 to T1 Negative Hoffman sign bilaterally Reflexes normal  Osteopathic findings Cervical C2 flexed rotated and side bent right C4 flexed rotated and side bent left T3  extended rotated and side bent right inhaled third rib T9 extended rotated and side bent left L3 flexed rotated and side bent right Sacrum left on left       Impression and Recommendations:     This case required medical decision making of moderate complexity.      Note: This dictation was prepared with Dragon dictation along with smaller phrase technology. Any transcriptional errors that result from this process are unintentional.

## 2017-04-06 ENCOUNTER — Encounter: Payer: Self-pay | Admitting: Family Medicine

## 2017-04-06 ENCOUNTER — Ambulatory Visit (INDEPENDENT_AMBULATORY_CARE_PROVIDER_SITE_OTHER): Payer: PPO | Admitting: Family Medicine

## 2017-04-06 VITALS — BP 106/68 | HR 57 | Ht 66.0 in | Wt 125.0 lb

## 2017-04-06 DIAGNOSIS — G8929 Other chronic pain: Secondary | ICD-10-CM | POA: Diagnosis not present

## 2017-04-06 DIAGNOSIS — M999 Biomechanical lesion, unspecified: Secondary | ICD-10-CM | POA: Diagnosis not present

## 2017-04-06 DIAGNOSIS — M2550 Pain in unspecified joint: Secondary | ICD-10-CM | POA: Diagnosis not present

## 2017-04-06 DIAGNOSIS — M545 Low back pain: Secondary | ICD-10-CM | POA: Diagnosis not present

## 2017-04-06 NOTE — Assessment & Plan Note (Signed)
Decision today to treat with OMT was based on Physical Exam  After verbal consent patient was treated with HVLA, ME, FPR techniques in cervical, thoracic, lumbar and sacral areas  Patient tolerated the procedure well with improvement in symptoms  Patient given exercises, stretches and lifestyle modifications  See medications in patient instructions if given  Patient will follow up in 4 weeks 

## 2017-04-06 NOTE — Progress Notes (Signed)
[name] [place], [place] [address] Phone: [phone] Subjective:     CC: low back and neck pain f/u  [id number]  Whitney Krichbaum Md is a 66 y.o. female coming in with complaint of Low back and neck pain. Patient states and lower back seems to be doing somewhat better. Patient has started having worsening symptoms more of the neck recently. Patient describes it as a dull, throbbing aching pain. Patient states that there is more tightness. Patient still has not been able to be as active as usual.        Past Medical History:  Diagnosis Date  . Arthritis    Past Surgical History:  Procedure Laterality Date  . APPENDECTOMY  1983  . CHOLECYSTECTOMY  1975  . TONSILLECTOMY AND ADENOIDECTOMY     Social History   Social History  . Marital status: Divorced    Spouse name: N/A  . Number of children: N/A  . Years of education: N/A   Social History Main Topics  . Smoking status: Former Research scientist (life sciences)  . Smokeless tobacco: Never Used  . Alcohol use Yes  . Drug use: No  . Sexual activity: Not Asked   Other Topics Concern  . None   Social History Narrative  . None   Allergies  Allergen Reactions  . Sulfonamide Derivatives     REACTION: rash   Family History  Problem Relation Age of Onset  . Cancer Mother   . Hyperlipidemia Mother   . Cancer Father   . Hyperlipidemia Father   . Birth defects Maternal Grandmother   . Birth defects Paternal Grandmother     Past medical history, social, surgical and family history all reviewed in electronic medical record.  No pertanent information unless stated regarding to the chief complaint.   Review of Systems: No headache, visual changes, nausea, vomiting, diarrhea, constipation, dizziness, abdominal pain, skin rash, fevers, chills, night sweats, weight loss, swollen lymph nodes, body aches, joint swelling, muscle aches, chest pain, shortness of breath, mood changes.  Positive muscle  aches    Objective  Blood pressure 106/68, pulse (!) 57, height 5\' 6"  (1.676 m), weight 125 lb (56.7 kg), SpO2 97 %.  Systems examined below as of 04/06/17 General: NAD A&O x3 mood, affect normal  HEENT: Pupils equal, extraocular movements intact no nystagmus Respiratory: not short of breath at rest or with speaking Cardiovascular: No lower extremity edema, non tender Skin: Warm dry intact with no signs of infection or rash on extremities or on axial skeleton. Abdomen: Soft nontender, no masses Neuro: Cranial nerves  intact, neurovascularly intact in all extremities with 2+ DTRs and 2+ pulses. Lymph: No lymphadenopathy appreciated today  Gait normal with good balance and coordination.  MSK: Non tender with full range of motion and good stability and symmetric strength and tone of shoulders, elbows, wrist,  knee hips and ankles bilaterally.  Mild arthritic changes Back Exam:  Inspection: Unremarkable  Motion: Flexion 45 deg, Extension 25 deg, Side Bending to 45 deg bilaterally,  Rotation to 45 deg bilaterally  SLR laying: Negative  XSLR laying: Negative  Palpable tenderness: Tender to palpation in the paraspinal musculature.Marland Kitchen FABER: Mild tightness bilaterally. Sensory change: Gross sensation intact to all lumbar and sacral dermatomes.  Reflexes: 2+ at both patellar tendons, 2+ at achilles tendons, Babinski's downgoing.  Strength at foot  Plantar-flexion: 5/5 Dorsi-flexion: 5/5 Eversion: 5/5 Inversion: 5/5  Leg strength  Quad: 5/5 Hamstring: 5/5 Hip flexor: 5/5 Hip abductors: 5/5  Gait unremarkable.   Neck: Inspection unremarkable. No palpable stepoffs. Negative Spurling's maneuver. Full neck range of motion Grip strength and sensation normal in bilateral hands Strength good C4 to T1 distribution No sensory change to C4 to T1 Negative Hoffman sign bilaterally Reflexes normal  Osteopathic findings C2 flexed rotated and side bent right C4 flexed rotated and side bent left C6  flexed rotated and side bent left T3 extended rotated and side bent right inhaled third rib T9 extended rotated and side bent left L2 flexed rotated and side bent right Sacrum right on right       Impression and Recommendations:     This case required medical decision making of moderate complexity.      Note: This dictation was prepared with Dragon dictation along with smaller phrase technology. Any transcriptional errors that result from this process are unintentional.

## 2017-04-06 NOTE — Assessment & Plan Note (Signed)
Still some tightness noted. We discussed icing regimen and home exercises. Discussed manipulation  Still respond to OMT  Patient follow-up again in 4 weeks

## 2017-04-06 NOTE — Patient Instructions (Signed)
You are doing well  I am impressed On wall with heels, butt shoulder and head touching for a goal of 5 minutes daily  Stay active and maybe get back to a little running I will pass on the potential for good news ;) Get labs when you can  See me again in 4 weeks.

## 2017-04-07 ENCOUNTER — Ambulatory Visit: Payer: PPO | Admitting: Family Medicine

## 2017-04-15 DIAGNOSIS — Z124 Encounter for screening for malignant neoplasm of cervix: Secondary | ICD-10-CM | POA: Diagnosis not present

## 2017-04-15 DIAGNOSIS — Z1231 Encounter for screening mammogram for malignant neoplasm of breast: Secondary | ICD-10-CM | POA: Diagnosis not present

## 2017-04-15 DIAGNOSIS — N950 Postmenopausal bleeding: Secondary | ICD-10-CM | POA: Diagnosis not present

## 2017-04-20 ENCOUNTER — Other Ambulatory Visit: Payer: Self-pay | Admitting: Obstetrics and Gynecology

## 2017-04-20 DIAGNOSIS — R921 Mammographic calcification found on diagnostic imaging of breast: Secondary | ICD-10-CM

## 2017-04-20 DIAGNOSIS — R928 Other abnormal and inconclusive findings on diagnostic imaging of breast: Secondary | ICD-10-CM

## 2017-04-27 ENCOUNTER — Other Ambulatory Visit: Payer: Self-pay | Admitting: Obstetrics and Gynecology

## 2017-04-27 ENCOUNTER — Ambulatory Visit
Admission: RE | Admit: 2017-04-27 | Discharge: 2017-04-27 | Disposition: A | Discharge status: Home / Self Care | Payer: PPO | Source: Ambulatory Visit | Attending: Obstetrics and Gynecology | Admitting: Obstetrics and Gynecology

## 2017-04-27 DIAGNOSIS — R928 Other abnormal and inconclusive findings on diagnostic imaging of breast: Secondary | ICD-10-CM | POA: Diagnosis not present

## 2017-04-27 DIAGNOSIS — R921 Mammographic calcification found on diagnostic imaging of breast: Secondary | ICD-10-CM

## 2017-05-04 ENCOUNTER — Encounter: Payer: Self-pay | Admitting: Family Medicine

## 2017-05-04 ENCOUNTER — Ambulatory Visit (INDEPENDENT_AMBULATORY_CARE_PROVIDER_SITE_OTHER): Payer: PPO | Admitting: Family Medicine

## 2017-05-04 VITALS — BP 102/62 | HR 53 | Ht 66.0 in | Wt 122.0 lb

## 2017-05-04 DIAGNOSIS — M545 Low back pain, unspecified: Secondary | ICD-10-CM

## 2017-05-04 DIAGNOSIS — G8929 Other chronic pain: Secondary | ICD-10-CM | POA: Diagnosis not present

## 2017-05-04 DIAGNOSIS — M999 Biomechanical lesion, unspecified: Secondary | ICD-10-CM | POA: Diagnosis not present

## 2017-05-04 NOTE — Assessment & Plan Note (Signed)
Decision today to treat with OMT was based on Physical Exam  After verbal consent patient was treated with HVLA, ME, FPR techniques in cervical, thoracic, lumbar and sacral areas  Patient tolerated the procedure well with improvement in symptoms  Patient given exercises, stretches and lifestyle modifications  See medications in patient instructions if given  Patient will follow up in 4 weeks 

## 2017-05-04 NOTE — Assessment & Plan Note (Signed)
Stable lateral. No change in management at this time. Encourage her to try to increase activity as tolerated. Patient will come back and see me again in 4 weeks. Continues respond well to manipulation.

## 2017-05-04 NOTE — Progress Notes (Signed)
[name] [place], [place] [address] Phone: [phone] Subjective:     CC: low back and neck pain f/u  [id number]  Tyresha Fede Md is a 66 y.o. female coming in with complaint of Low back and neck pain. Overall seems to be doing better. Cognition seems to be improving as well. We discussed icing regimen and home exercises. We discussed which activities to do a which ones to avoid. We discussed icing regimen. Patient states that overall she seems to be doing fairly well. Still has not been running. States that the aches and pains are more manageable.        Past Medical History:  Diagnosis Date  . Arthritis    Past Surgical History:  Procedure Laterality Date  . APPENDECTOMY  1983  . CHOLECYSTECTOMY  1975  . TONSILLECTOMY AND ADENOIDECTOMY     Social History   Social History  . Marital status: Divorced    Spouse name: N/A  . Number of children: N/A  . Years of education: N/A   Social History Main Topics  . Smoking status: Former Research scientist (life sciences)  . Smokeless tobacco: Never Used  . Alcohol use Yes  . Drug use: No  . Sexual activity: Not Asked   Other Topics Concern  . None   Social History Narrative  . None   Allergies  Allergen Reactions  . Sulfonamide Derivatives     REACTION: rash   Family History  Problem Relation Age of Onset  . Cancer Mother   . Hyperlipidemia Mother   . Cancer Father   . Hyperlipidemia Father   . Birth defects Maternal Grandmother   . Birth defects Paternal Grandmother     Past medical history, social, surgical and family history all reviewed in electronic medical record.  No pertanent information unless stated regarding to the chief complaint.   Review of Systems: No headache, visual changes, nausea, vomiting, diarrhea, constipation, dizziness, abdominal pain, skin rash, fevers, chills, night sweats, weight loss, swollen lymph nodes, body aches, joint swelling, chest pain, shortness of  breath, mood changes.  Positive muscle aches   Objective  Blood pressure 102/62, pulse (!) 53, height 5\' 6"  (1.676 m), weight 122 lb (55.3 kg), SpO2 98 %.  Systems examined below as of 05/04/17 General: NAD A&O x3 mood, affect normal  HEENT: Pupils equal, extraocular movements intact no nystagmus Respiratory: not short of breath at rest or with speaking Cardiovascular: No lower extremity edema, non tender Skin: Warm dry intact with no signs of infection or rash on extremities or on axial skeleton. Abdomen: Soft nontender, no masses Neuro: Cranial nerves  intact, neurovascularly intact in all extremities with 2+ DTRs and 2+ pulses. Lymph: No lymphadenopathy appreciated today  Gait normal with good balance and coordination.  MSK: Non tender with full range of motion and good stability and symmetric strength and tone of shoulders, elbows, wrist,  knee hips and ankles bilaterally.  Mild arthritic changes Back Exam:  Inspection: Unremarkable  Motion: Flexion 35 deg, Extension 25 deg, Side Bending to 35 deg bilaterally,  Rotation to 45 deg bilaterally  SLR laying: Negative  XSLR laying: Negative  Palpable tenderness: Tender to palpation in the paraspinal musculature noted.Marland Kitchen FABER: negative. Sensory change: Gross sensation intact to all lumbar and sacral dermatomes.  Reflexes: 2+ at both patellar tendons, 2+ at achilles tendons, Babinski's downgoing.  Strength at foot  Plantar-flexion: 5/5 Dorsi-flexion: 5/5 Eversion: 5/5 Inversion: 5/5  Leg strength  Quad: 5/5 Hamstring: 5/5  Hip flexor: 5/5 Hip abductors: 5/5  Gait unremarkable.  Neck: Inspection unremarkable. No palpable stepoffs. Negative Spurling's maneuver. Full neck range of motion Grip strength and sensation normal in bilateral hands Strength good C4 to T1 distribution No sensory change to C4 to T1 Negative Hoffman sign bilaterally Reflexes normal  Osteopathic findings C2 flexed rotated and side bent right C4 flexed  rotated and side bent left C7 flexed rotated and side bent left T3 extended rotated and side bent right inhaled third rib T11 extended rotated and side bent left L2 flexed rotated and side bent right Sacrum right on right        Impression and Recommendations:     This case required medical decision making of moderate complexity.      Note: This dictation was prepared with Dragon dictation along with smaller phrase technology. Any transcriptional errors that result from this process are unintentional.

## 2017-05-04 NOTE — Patient Instructions (Signed)
Se me again in 4 weeks Keep it going!

## 2017-06-01 ENCOUNTER — Ambulatory Visit (INDEPENDENT_AMBULATORY_CARE_PROVIDER_SITE_OTHER): Payer: PPO | Admitting: Family Medicine

## 2017-06-01 ENCOUNTER — Encounter: Payer: Self-pay | Admitting: Family Medicine

## 2017-06-01 VITALS — BP 92/68 | HR 62 | Ht 66.0 in | Wt 123.0 lb

## 2017-06-01 DIAGNOSIS — M999 Biomechanical lesion, unspecified: Secondary | ICD-10-CM

## 2017-06-01 DIAGNOSIS — M545 Low back pain, unspecified: Secondary | ICD-10-CM

## 2017-06-01 DIAGNOSIS — G8929 Other chronic pain: Secondary | ICD-10-CM | POA: Diagnosis not present

## 2017-06-01 NOTE — Assessment & Plan Note (Signed)
Decision today to treat with OMT was based on Physical Exam  After verbal consent patient was treated with HVLA, ME, FPR techniques in cervical, thoracic, lumbar and sacral areas  Patient tolerated the procedure well with improvement in symptoms  Patient given exercises, stretches and lifestyle modifications  See medications in patient instructions if given  Patient will follow up in 4 weeks 

## 2017-06-01 NOTE — Assessment & Plan Note (Signed)
Stable Noted range of motion again.  RTC in 4 weeks.  Aerobic changes

## 2017-06-01 NOTE — Patient Instructions (Addendum)
Good to see you  Keep it up  Golf can be great  See me again in 4 weeks

## 2017-06-01 NOTE — Progress Notes (Signed)
[name] [place], [place] [address] Phone: [phone] Subjective:     CC: low back and neck pain f/u  [id number]  Whitney Liguori Md is a 66 y.o. female coming in with complaint of Low back and neck pain. Has seen patient will times for low back pain. Patient discovered the pain as a dull, throbbing aching pain. Very minimal. Started playing golf. No new symptoms just tightening of previous muscle aches.      Past Medical History:  Diagnosis Date  . Arthritis    Past Surgical History:  Procedure Laterality Date  . APPENDECTOMY  1983  . CHOLECYSTECTOMY  1975  . TONSILLECTOMY AND ADENOIDECTOMY     Social History   Social History  . Marital status: Divorced    Spouse name: N/A  . Number of children: N/A  . Years of education: N/A   Social History Main Topics  . Smoking status: Former Research scientist (life sciences)  . Smokeless tobacco: Never Used  . Alcohol use Yes  . Drug use: No  . Sexual activity: Not Asked   Other Topics Concern  . None   Social History Narrative  . None   Allergies  Allergen Reactions  . Sulfonamide Derivatives     REACTION: rash   Family History  Problem Relation Age of Onset  . Cancer Mother   . Hyperlipidemia Mother   . Cancer Father   . Hyperlipidemia Father   . Birth defects Maternal Grandmother   . Birth defects Paternal Grandmother     Past medical history, social, surgical and family history all reviewed in electronic medical record.  No pertanent information unless stated regarding to the chief complaint.   Review of Systems: No headache, visual changes, nausea, vomiting, diarrhea, constipation, dizziness, abdominal pain, skin rash, fevers, chills, night sweats, weight loss, swollen lymph nodes, body aches, joint swelling,  chest pain, shortness of breath, mood changes.  + muscle aches.    Objective  Blood pressure 92/68, pulse 62, height 5\' 6"  (1.676 m), weight 123 lb (55.8 kg), SpO2 98 %.    Systems examined below as of 06/01/17 General: NAD A&O x3 mood, affect normal  HEENT: Pupils equal, extraocular movements intact no nystagmus Respiratory: not short of breath at rest or with speaking Cardiovascular: No lower extremity edema, non tender Skin: Warm dry intact with no signs of infection or rash on extremities or on axial skeleton. Abdomen: Soft nontender, no masses Neuro: Cranial nerves  intact, neurovascularly intact in all extremities with 2+ DTRs and 2+ pulses. Lymph: No lymphadenopathy appreciated today  Gait normal with good balance and coordination.  MSK: Non tender with full range of motion and good stability and symmetric strength and tone of shoulders, elbows, wrist,  knee hips and ankles bilaterally.  Mild arthritic changes Back Exam:  Inspection: Unremarkable  Motion: Flexion 35 deg, Extension 25 deg, Side Bending to 35 deg bilaterally,  Rotation to 45 deg bilaterally  SLR laying: Negative  XSLR laying: Negative  Palpable tenderness: still tightness FABER: negative. Sensory change: Gross sensation intact to all lumbar and sacral dermatomes.  Reflexes: 2+ at both patellar tendons, 2+ at achilles tendons, Babinski's downgoing.  Strength at foot  Plantar-flexion: 5/5 Dorsi-flexion: 5/5 Eversion: 5/5 Inversion: 5/5  Leg strength  Quad: 5/5 Hamstring: 5/5 Hip flexor: 5/5 Hip abductors: 5/5  Gait unremarkable.  Neck: Inspection unremarkable. No palpable stepoffs. Negative Spurling's maneuver. Full neck range of motion Grip strength and sensation normal in bilateral hands Strength  good C4 to T1 distribution No sensory change to C4 to T1 Negative Hoffman sign bilaterally Reflexes normal  Osteopathic findings C2 flexed rotated and side bent right C4 flexed rotated and side bent left C7 flexed rotated and side bent left T3 extended rotated and side bent right inhaled third rib T6 extended rotated and side bent left L2 flexed rotated and side bent  right Sacrum right on right    Impression and Recommendations:     This case required medical decision making of moderate complexity.      Note: This dictation was prepared with Dragon dictation along with smaller phrase technology. Any transcriptional errors that result from this process are unintentional.

## 2017-06-10 ENCOUNTER — Ambulatory Visit (INDEPENDENT_AMBULATORY_CARE_PROVIDER_SITE_OTHER): Payer: PPO | Admitting: Family Medicine

## 2017-06-10 ENCOUNTER — Encounter: Payer: Self-pay | Admitting: Family Medicine

## 2017-06-10 VITALS — BP 90/60 | HR 65

## 2017-06-10 DIAGNOSIS — M999 Biomechanical lesion, unspecified: Secondary | ICD-10-CM | POA: Diagnosis not present

## 2017-06-10 DIAGNOSIS — M545 Low back pain, unspecified: Secondary | ICD-10-CM

## 2017-06-10 DIAGNOSIS — G8929 Other chronic pain: Secondary | ICD-10-CM | POA: Diagnosis not present

## 2017-06-10 MED ORDER — KETOROLAC TROMETHAMINE 60 MG/2ML IM SOLN
60.0000 mg | Freq: Once | INTRAMUSCULAR | Status: AC
  Administered 2017-06-10: 60 mg via INTRAMUSCULAR

## 2017-06-10 NOTE — Assessment & Plan Note (Signed)
Decision today to treat with OMT was based on Physical Exam  After verbal consent patient was treated with HVLA, ME, FPR techniques in  thoracic, lumbar and sacral areas  Patient tolerated the procedure well with improvement in symptoms  Patient given exercises, stretches and lifestyle modifications  See medications in patient instructions if given  Patient will follow up in 4 weeks 

## 2017-06-10 NOTE — Patient Instructions (Signed)
Good to see you  Sorry but seems all muscle  Glyciene and the magnesium would be great  pennsaid pinkie amount topically 2 times daily as needed.   Ice 20 minutes every 2-3 hours toradol given today  See me again as schedule and call me if you need me sooner.

## 2017-06-10 NOTE — Progress Notes (Signed)
[name] [place], [place] [address] Phone: [phone] Subjective:    I'm seeing this patient by the request  of:    CC: Low back pain exacerbation  [id number]  Whitney [name] is a 66 y.o. female coming in with complaint of low back pain. Patient has been very active. Hasn't been making significant progress for some time but now is having worsening symptoms again. Patient was being more active doing more repetitive lifting. States that the severe pain of the lower back. Patient states it only hurts her when she seems to flex her try to extend too quickly. Not as much pain with rotation. No radiation down the legs, no numbness or tingling. No weakness. Denies any bowel or bladder incontinence. Happen initially when she went to pick something up and was more of a dead lift.   Past Medical History:  Diagnosis Date  . Arthritis    Past Surgical History:  Procedure Laterality Date  . APPENDECTOMY  1983  . CHOLECYSTECTOMY  1975  . TONSILLECTOMY AND ADENOIDECTOMY     Social History   Social History  . Marital status: Divorced    Spouse name: N/A  . Number of children: N/A  . Years of education: N/A   Social History Main Topics  . Smoking status: Former Research scientist (life sciences)  . Smokeless tobacco: Never Used  . Alcohol use Yes  . Drug use: No  . Sexual activity: Not on file   Other Topics Concern  . Not on file   Social History Narrative  . No narrative on file   Allergies  Allergen Reactions  . Sulfonamide Derivatives     REACTION: rash   Family History  Problem Relation Age of Onset  . Cancer Mother   . Hyperlipidemia Mother   . Cancer Father   . Hyperlipidemia Father   . Birth defects Maternal Grandmother   . Birth defects Paternal Grandmother      Past medical history, social, surgical and family history all reviewed in electronic medical record.  No pertanent information unless stated regarding to the chief complaint.     Review of Systems:Review of systems updated and as accurate as of 06/10/17  No headache, visual changes, nausea, vomiting, diarrhea, constipation, dizziness, abdominal pain, skin rash, fevers, chills, night sweats, weight loss, swollen lymph nodes, body aches, joint swelling, muscle aches, chest pain, shortness of breath, mood changes.   Objective  There were no vitals taken for this visit. Systems examined below as of 06/10/17   General: No apparent distress alert and oriented x3 mood and affect normal, dressed appropriately.  HEENT: Pupils equal, extraocular movements intact  Respiratory: Patient's speak in full sentences and does not appear short of breath  Cardiovascular: No lower extremity edema, non tender, no erythema  Skin: Warm dry intact with no signs of infection or rash on extremities or on axial skeleton.  Abdomen: Soft nontender  Neuro: Cranial nerves II through XII are intact, neurovascularly intact in all extremities with 2+ DTRs and 2+ pulses.  Lymph: No lymphadenopathy of posterior or anterior cervical chain or axillae bilaterally.  Gait cautious  MSK:  Non tender with full range of motion and good stability and symmetric strength and tone of shoulders, elbows, wrist, hip, knee and ankles bilaterally. Mild arthritic changes of multiple joints Back Exam:  Inspection: Unremarkable  Motion: Flexion 45 deg, Extension 25 deg, Side Bending to 35 deg bilaterally,  Rotation to 35 deg bilaterally  SLR laying:  Negative  XSLR laying: Negative  Palpable tenderness: Tender to palpation appears palmar musculature lumbar spine. More over the L2-L4 bilaterally right greater than left. FABER: negative. Sensory change: Gross sensation intact to all lumbar and sacral dermatomes.  Reflexes: 2+ at both patellar tendons, 2+ at achilles tendons, Babinski's downgoing.  Strength at foot  Plantar-flexion: 5/5 Dorsi-flexion: 5/5 Eversion: 5/5 Inversion: 5/5  Leg strength  Quad: 5/5  Hamstring: 5/5 Hip flexor: 4/5, spasms on the right side Hip abductors: 5/5  Gait unremarkable.  Osteopathic findings T9 extended rotated and side bent left L2 flexed rotated and side bent right L4 flexed rotated and side bent right Sacrum right on right    Impression and Recommendations:     This case required medical decision making of moderate complexity.      Note: This dictation was prepared with Dragon dictation along with smaller phrase technology. Any transcriptional errors that result from this process are unintentional.

## 2017-06-10 NOTE — Assessment & Plan Note (Signed)
Low back pain  Spasm noted.  Toradol attempted OMT with improvement.   Patient will start increasing activity again.

## 2017-06-29 ENCOUNTER — Ambulatory Visit: Payer: PPO | Admitting: Family Medicine

## 2017-07-01 ENCOUNTER — Ambulatory Visit (INDEPENDENT_AMBULATORY_CARE_PROVIDER_SITE_OTHER): Payer: PPO | Admitting: Family Medicine

## 2017-07-01 ENCOUNTER — Encounter: Payer: Self-pay | Admitting: Family Medicine

## 2017-07-01 VITALS — BP 110/62 | HR 58 | Wt 123.0 lb

## 2017-07-01 DIAGNOSIS — G8929 Other chronic pain: Secondary | ICD-10-CM | POA: Diagnosis not present

## 2017-07-01 DIAGNOSIS — M545 Low back pain: Secondary | ICD-10-CM | POA: Diagnosis not present

## 2017-07-01 DIAGNOSIS — M999 Biomechanical lesion, unspecified: Secondary | ICD-10-CM

## 2017-07-01 NOTE — Assessment & Plan Note (Signed)
Stable. Discussed home body vibration, we discussed icing regimen. We discussed which activities doing which ones to avoid. Patient will see me again in 4-6 weeks. Patient encouraged to do more weight training.

## 2017-07-01 NOTE — Assessment & Plan Note (Signed)
Decision today to treat with OMT was based on Physical Exam  After verbal consent patient was treated with HVLA, ME, FPR techniques in cervical, thoracic, lumbar and sacral areas  Patient tolerated the procedure well with improvement in symptoms  Patient given exercises, stretches and lifestyle modifications  See medications in patient instructions if given  Patient will follow up in 4 weeks 

## 2017-07-01 NOTE — Progress Notes (Signed)
[name] [place], [place] [address] Phone: [phone] Subjective:    I'm seeing this patient by the request  of:    CC: Low back pain f/u  [id number]  Whitney Mullet Md is a 66 y.o. female coming in with complaint of low back pain. Denies any numbness or any radiation. Still some tightness with some mild spasming from time to time   Past Medical History:  Diagnosis Date  . Arthritis    Past Surgical History:  Procedure Laterality Date  . APPENDECTOMY  1983  . CHOLECYSTECTOMY  1975  . TONSILLECTOMY AND ADENOIDECTOMY     Social History   Social History  . Marital status: Divorced    Spouse name: N/A  . Number of children: N/A  . Years of education: N/A   Social History Main Topics  . Smoking status: Former Research scientist (life sciences)  . Smokeless tobacco: Never Used  . Alcohol use Yes  . Drug use: No  . Sexual activity: Not Asked   Other Topics Concern  . None   Social History Narrative  . None   Allergies  Allergen Reactions  . Sulfonamide Derivatives     REACTION: rash   Family History  Problem Relation Age of Onset  . Cancer Mother   . Hyperlipidemia Mother   . Cancer Father   . Hyperlipidemia Father   . Birth defects Maternal Grandmother   . Birth defects Paternal Grandmother      Past medical history, social, surgical and family history all reviewed in electronic medical record.  No pertanent information unless stated regarding to the chief complaint.   Review of Systems: No headache, visual changes, nausea, vomiting, diarrhea, constipation, dizziness, abdominal pain, skin rash, fevers, chills, night sweats, weight loss, swollen lymph nodes, body aches, joint swelling,  chest pain, shortness of breath, mood changes.  Positive muscle aches  Objective  Blood pressure 110/62, pulse (!) 58, weight 123 lb (55.8 kg).   Systems examined below as of 07/01/17 General: NAD A&O x3 mood, affect normal  HEENT: Pupils equal,  extraocular movements intact no nystagmus Respiratory: not short of breath at rest or with speaking Cardiovascular: No lower extremity edema, non tender Skin: Warm dry intact with no signs of infection or rash on extremities or on axial skeleton. Abdomen: Soft nontender, no masses Neuro: Cranial nerves  intact, neurovascularly intact in all extremities with 2+ DTRs and 2+ pulses. Lymph: No lymphadenopathy appreciated today  Gait normal with good balance and coordination.  MSK: Non tender with full range of motion and good stability and symmetric strength and tone of shoulders, elbows, wrist,  knee hips and ankles bilaterally.   Back Exam:  Inspection: Unremarkable  Motion: Flexion 45 deg, Extension 25 deg, Side Bending to 35 deg bilaterally,  Rotation to 35 deg bilaterally  SLR laying: Negative  XSLR laying: Negative  Palpable tenderness:  Continued tenderness to palpation over the paraspinal [place] lumbar spine FABER: negative. Sensory change: Gross sensation intact to all lumbar and sacral dermatomes.  Reflexes: 2+ at both patellar tendons, 2+ at achilles tendons, Babinski's downgoing.  Strength at foot  . Out of 5 and symmetric the lower exties  Osteopathic findings C2 flexed rotated and side bent right C5 flexed rotated and side bent left T3 extended rotated and side bent right inhaled third rib T6 extended rotated and side bent left L2 flexed rotated and side bent right Sacrum right on right     Impression and Recommendations:  This case required medical decision making of moderate complexity.      Note: This dictation was prepared with Dragon dictation along with smaller phrase technology. Any transcriptional errors that result from this process are unintentional.

## 2017-07-01 NOTE — Patient Instructions (Signed)
Good to see you  Get in the gym 2 times a week  [place] whole body vibration  See me again in 4 weeks.

## 2017-07-15 DIAGNOSIS — H43813 Vitreous degeneration, bilateral: Secondary | ICD-10-CM | POA: Diagnosis not present

## 2017-07-15 DIAGNOSIS — H3540 Unspecified peripheral retinal degeneration: Secondary | ICD-10-CM | POA: Diagnosis not present

## 2017-07-20 ENCOUNTER — Other Ambulatory Visit (INDEPENDENT_AMBULATORY_CARE_PROVIDER_SITE_OTHER): Payer: PPO

## 2017-07-20 DIAGNOSIS — Z8742 Personal history of other diseases of the female genital tract: Secondary | ICD-10-CM | POA: Diagnosis not present

## 2017-07-20 DIAGNOSIS — M2550 Pain in unspecified joint: Secondary | ICD-10-CM | POA: Diagnosis not present

## 2017-07-20 LAB — CORTISOL: Cortisol, Plasma: 17.9 ug/dL

## 2017-07-20 NOTE — Addendum Note (Signed)
Addended by: Sallye Ober on: 07/20/2017 09:05 AM   Modules accepted: Orders

## 2017-07-20 NOTE — Progress Notes (Unsigned)
Not terrible

## 2017-07-23 LAB — PTH, INTACT AND CALCIUM
CALCIUM: 9.6 mg/dL (ref 8.6–10.4)
PTH: 23 pg/mL (ref 14–64)

## 2017-07-23 LAB — ACTH: C206 ACTH: 32 pg/mL (ref 6–50)

## 2017-07-23 LAB — DHEA: DHEA: 168 ng/dL (ref 102–1185)

## 2017-07-29 ENCOUNTER — Encounter: Payer: Self-pay | Admitting: Family Medicine

## 2017-07-29 ENCOUNTER — Ambulatory Visit (INDEPENDENT_AMBULATORY_CARE_PROVIDER_SITE_OTHER): Payer: PPO | Admitting: Family Medicine

## 2017-07-29 VITALS — BP 108/70 | HR 56 | Ht 66.0 in | Wt 123.0 lb

## 2017-07-29 DIAGNOSIS — M999 Biomechanical lesion, unspecified: Secondary | ICD-10-CM

## 2017-07-29 DIAGNOSIS — G8929 Other chronic pain: Secondary | ICD-10-CM

## 2017-07-29 DIAGNOSIS — M545 Low back pain, unspecified: Secondary | ICD-10-CM

## 2017-07-29 NOTE — Progress Notes (Signed)
[name] [place], [place] [address] Phone: [phone] Subjective:    I'm seeing this patient by the request  of:    CC: Low back pain f/u  [id number]  Whitney Mcdowell Md is a 66 y.o. female coming in with complaint of low back pain. Denies any numbness or any radiation. Still some tightness with some mild spasming from time to time. Started more with a home body vibration. Feels that this is somewhat beneficial. This [place] increase activity as tolerated. Following up again for more manipulation. Feels like his long as she doesn't fairly regularly she seems to be making progress.   Past Medical History:  Diagnosis Date  . Arthritis    Past Surgical History:  Procedure Laterality Date  . APPENDECTOMY  1983  . CHOLECYSTECTOMY  1975  . TONSILLECTOMY AND ADENOIDECTOMY     Social History   Social History  . Marital status: Divorced    Spouse name: N/A  . Number of children: N/A  . Years of education: N/A   Social History Main Topics  . Smoking status: Former Research scientist (life sciences)  . Smokeless tobacco: Never Used  . Alcohol use Yes  . Drug use: No  . Sexual activity: Not Asked   Other Topics Concern  . None   Social History Narrative  . None   Allergies  Allergen Reactions  . Sulfonamide Derivatives     REACTION: rash   Family History  Problem Relation Age of Onset  . Cancer Mother   . Hyperlipidemia Mother   . Cancer Father   . Hyperlipidemia Father   . Birth defects Maternal Grandmother   . Birth defects Paternal Grandmother      Past medical history, social, surgical and family history all reviewed in electronic medical record.  No pertanent information unless stated regarding to the chief complaint.   Review of Systems: No headache, visual changes, nausea, vomiting, diarrhea, constipation, dizziness, abdominal pain, skin rash, fevers, chills, night sweats, weight loss, swollen lymph nodes, body aches, joint swelling,  chest pain, shortness of breath, mood changes.  Positive muscle aches  Objective  Blood pressure 108/70, pulse (!) 56, height 5\' 6"  (1.676 m), weight 123 lb (55.8 kg), SpO2 97 %.   Systems examined below as of 07/29/17 General: NAD A&O x3 mood, affect normal  HEENT: Pupils equal, extraocular movements intact no nystagmus Respiratory: not short of breath at rest or with speaking Cardiovascular: No lower extremity edema, non tender Skin: Warm dry intact with no signs of infection or rash on extremities or on axial skeleton. Abdomen: Soft nontender, no masses Neuro: Cranial nerves  intact, neurovascularly intact in all extremities with 2+ DTRs and 2+ pulses. Lymph: No lymphadenopathy appreciated today  Gait normal with good balance and coordination.  MSK: Non tender with full range of motion and good stability and symmetric strength and tone of shoulders, elbows, wrist,  knee hips and ankles bilaterally.   Back Exam:  Inspection: Mild degenerative scoliosis noted Motion: Flexion 45 deg, Extension 25 deg, Side Bending to 35 deg bilaterally,  Rotation to 35 deg bilaterally  SLR laying: Negative  XSLR laying: Negative  Palpable tenderness: None. FABER: negative. Sensory change: Gross sensation intact to all lumbar and sacral dermatomes.  Reflexes: 2+ at both patellar tendons, 2+ at achilles tendons, Babinski's downgoing.  Strength at foot  Plantar-flexion: 5/5 Dorsi-flexion: 5/5 Eversion: 5/5 Inversion: 5/5  Leg strength  Quad: 5/5 Hamstring: 5/5 Hip flexor: 5/5 Hip abductors: 5/5  Gait unremarkable.   Osteopathic findings C2 flexed rotated and side bent right T3 extended rotated and side bent right inhaled third rib T6 extended rotated and side bent left L3 flexed rotated and side bent right Sacrum right on right     Impression and Recommendations:     This case required medical decision making of moderate complexity.      Note: This dictation was prepared with Dragon  dictation along with smaller phrase technology. Any transcriptional errors that result from this process are unintentional.

## 2017-07-29 NOTE — Assessment & Plan Note (Signed)
Decision today to treat with OMT was based on Physical Exam  After verbal consent patient was treated with HVLA, ME, FPR techniques in cervical, thoracic, lumbar and sacral areas  Patient tolerated the procedure well with improvement in symptoms  Patient given exercises, stretches and lifestyle modifications  See medications in patient instructions if given  Patient will follow up in 5-6 weeks

## 2017-07-29 NOTE — Patient Instructions (Signed)
Good to see you  Alvera Singh is your friend.  I am impressed [facility] and Garret Reddish See me again in 4-6 weeks

## 2017-07-29 NOTE — Assessment & Plan Note (Signed)
Stable and if anything seems to be improving. We discussed with patient at great length about home exercises, icing regimen, which activities to do in which ones to avoid. Patient will start increasing activity as tolerated. I do think that the whole body vibration can be beneficial. Follow-up again in 5-6 weeks

## 2017-08-03 ENCOUNTER — Ambulatory Visit
Admission: RE | Admit: 2017-08-03 | Discharge: 2017-08-03 | Disposition: A | Discharge status: Home / Self Care | Payer: PPO | Source: Ambulatory Visit | Attending: Obstetrics and Gynecology | Admitting: Obstetrics and Gynecology

## 2017-08-03 ENCOUNTER — Other Ambulatory Visit: Payer: Self-pay | Admitting: Obstetrics and Gynecology

## 2017-08-03 ENCOUNTER — Other Ambulatory Visit: Payer: PPO

## 2017-08-03 ENCOUNTER — Ambulatory Visit: Payer: PPO

## 2017-08-03 DIAGNOSIS — R921 Mammographic calcification found on diagnostic imaging of breast: Secondary | ICD-10-CM

## 2017-08-03 DIAGNOSIS — R928 Other abnormal and inconclusive findings on diagnostic imaging of breast: Secondary | ICD-10-CM | POA: Diagnosis not present

## 2017-08-03 DIAGNOSIS — Z5181 Encounter for therapeutic drug level monitoring: Secondary | ICD-10-CM | POA: Diagnosis not present

## 2017-08-03 DIAGNOSIS — R978 Other abnormal tumor markers: Secondary | ICD-10-CM | POA: Diagnosis not present

## 2017-08-03 DIAGNOSIS — E039 Hypothyroidism, unspecified: Secondary | ICD-10-CM | POA: Diagnosis not present

## 2017-08-09 DIAGNOSIS — Z170 Estrogen receptor positive status [ER+]: Secondary | ICD-10-CM

## 2017-08-09 DIAGNOSIS — C50919 Malignant neoplasm of unspecified site of unspecified female breast: Secondary | ICD-10-CM

## 2017-08-09 DIAGNOSIS — C50412 Malignant neoplasm of upper-outer quadrant of left female breast: Secondary | ICD-10-CM

## 2017-08-09 HISTORY — DX: Malignant neoplasm of unspecified site of unspecified female breast: C50.919

## 2017-08-09 HISTORY — DX: Estrogen receptor positive status (ER+): Z17.0

## 2017-08-09 HISTORY — DX: Malignant neoplasm of upper-outer quadrant of left female breast: C50.412

## 2017-08-10 ENCOUNTER — Ambulatory Visit
Admission: RE | Admit: 2017-08-10 | Discharge: 2017-08-10 | Disposition: A | Discharge status: Home / Self Care | Payer: PPO | Source: Ambulatory Visit | Attending: Obstetrics and Gynecology | Admitting: Obstetrics and Gynecology

## 2017-08-10 ENCOUNTER — Other Ambulatory Visit: Payer: Self-pay | Admitting: Obstetrics and Gynecology

## 2017-08-10 DIAGNOSIS — R921 Mammographic calcification found on diagnostic imaging of breast: Secondary | ICD-10-CM | POA: Diagnosis not present

## 2017-08-10 DIAGNOSIS — D0512 Intraductal carcinoma in situ of left breast: Secondary | ICD-10-CM | POA: Diagnosis not present

## 2017-08-17 DIAGNOSIS — Z170 Estrogen receptor positive status [ER+]: Secondary | ICD-10-CM | POA: Insufficient documentation

## 2017-08-17 DIAGNOSIS — D0512 Intraductal carcinoma in situ of left breast: Secondary | ICD-10-CM | POA: Diagnosis not present

## 2017-08-17 DIAGNOSIS — C50412 Malignant neoplasm of upper-outer quadrant of left female breast: Secondary | ICD-10-CM | POA: Diagnosis not present

## 2017-08-18 ENCOUNTER — Ambulatory Visit: Payer: Self-pay | Admitting: General Surgery

## 2017-08-18 ENCOUNTER — Other Ambulatory Visit: Payer: Self-pay | Admitting: General Surgery

## 2017-08-18 DIAGNOSIS — D0512 Intraductal carcinoma in situ of left breast: Secondary | ICD-10-CM

## 2017-08-19 ENCOUNTER — Ambulatory Visit (HOSPITAL_BASED_OUTPATIENT_CLINIC_OR_DEPARTMENT_OTHER): Payer: PPO | Admitting: Hematology and Oncology

## 2017-08-19 ENCOUNTER — Ambulatory Visit: Payer: PPO

## 2017-08-19 ENCOUNTER — Encounter: Payer: Self-pay | Admitting: *Deleted

## 2017-08-19 ENCOUNTER — Ambulatory Visit
Admission: RE | Admit: 2017-08-19 | Discharge: 2017-08-19 | Disposition: A | Discharge status: Home / Self Care | Payer: PPO | Source: Ambulatory Visit | Attending: General Surgery | Admitting: General Surgery

## 2017-08-19 DIAGNOSIS — D0512 Intraductal carcinoma in situ of left breast: Secondary | ICD-10-CM | POA: Diagnosis not present

## 2017-08-19 DIAGNOSIS — Z170 Estrogen receptor positive status [ER+]: Secondary | ICD-10-CM | POA: Diagnosis not present

## 2017-08-19 DIAGNOSIS — N6489 Other specified disorders of breast: Secondary | ICD-10-CM | POA: Diagnosis not present

## 2017-08-19 MED ORDER — TRAZODONE HCL 100 MG PO TABS: 100.0000 mg | ORAL_TABLET | Freq: Every day | ORAL | Status: AC

## 2017-08-19 MED ORDER — GADOBENATE DIMEGLUMINE 529 MG/ML IV SOLN
11.0000 mL | Freq: Once | INTRAVENOUS | Status: AC | PRN
  Administered 2017-08-19: 11 mL via INTRAVENOUS

## 2017-08-19 NOTE — Progress Notes (Signed)
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 Patient had a mammogram 3 months ago that revealed calcifications. At that time she was recommended a biopsy but she decided to do watchful monitoring. A three-month follow-up was performed and there was an increase in the calcifications. She underwent biopsies of the left breast calcifications and it came back as high-grade DCIS with suspicion for microinvasion. 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 She attributes the breast cancer to the toxic black mold including Aspergillus that apparently were detected in her blood. She wishes to test her surgical specimen for mold.  I reviewed her records extensively and collaborated the history with the patient.  SUMMARY OF ONCOLOGIC HISTORY:   Ductal carcinoma in situ (DCIS) of left breast   08/10/2017 Initial Diagnosis    Left breast biopsy: Subareolar DCIS with calcifications, upper-outer quadrant DCIS with calcifications, ER 100%, PR 60%, high-grade, Tis Nx stage 0        MEDICAL HISTORY:  Past Medical History:  Diagnosis Date  . Arthritis     SURGICAL HISTORY: Past Surgical History:  Procedure Laterality Date  . APPENDECTOMY  1983  . CHOLECYSTECTOMY  1975  . TONSILLECTOMY AND ADENOIDECTOMY      SOCIAL HISTORY: Social History   Social History  . Marital status: Divorced    Spouse name: N/A  . Number of children: N/A  . Years of education: N/A   Occupational History  . Not on file.   Social History Main Topics  . Smoking status: Former Research scientist (life sciences)  . Smokeless tobacco: Never Used  . Alcohol use Yes  . Drug use: No  . Sexual activity:  Not on file   Other Topics Concern  . Not on file   Social History Narrative  . No narrative on file    FAMILY HISTORY: Family History  Problem Relation Age of Onset  . Cancer Mother   . Hyperlipidemia Mother   . Cancer Father   . Hyperlipidemia Father   . Birth defects Maternal Grandmother   . Birth defects Paternal Grandmother     ALLERGIES:  is allergic to sulfonamide derivatives.  MEDICATIONS:  Current Outpatient Prescriptions  Medication Sig Dispense Refill  . Progesterone Micronized (PROMETRIUM PO) Take 1 capsule by mouth at bedtime. 200mg     . thyroid (ARMOUR) 130 MG tablet Take 130 mg by mouth daily.    . traZODone (DESYREL) 100 MG tablet Take 1 tablet (100 mg total) by mouth at bedtime.    Marland Kitchen UNABLE TO FIND Med Name: estradiol-estriol-testosterone, 0.3g, pv     No current facility-administered medications for this visit.     REVIEW OF SYSTEMS:   Constitutional: Denies fevers, chills or abnormal night sweats Eyes: Denies blurriness of vision, double vision or watery eyes Ears, nose, mouth, throat, and face: Denies mucositis or sore throat Respiratory: Denies cough, dyspnea or wheezes Cardiovascular: Denies palpitation, chest discomfort or lower extremity swelling Gastrointestinal:  Denies nausea, heartburn or change in bowel habits Skin: Denies abnormal skin rashes Lymphatics: Denies new lymphadenopathy or easy bruising Neurological:Denies numbness, tingling or new weaknesses Behavioral/Psych: Mood is stable, no new changes  Breast:  Denies any palpable lumps or discharge All  other systems were reviewed with the patient and are negative.  PHYSICAL EXAMINATION: ECOG PERFORMANCE STATUS: 1 - Symptomatic but completely ambulatory  Vitals:   08/19/17 0911  BP: (!) 104/31  Pulse: (!) 59  Resp: 17  Temp: 98 F (36.7 C)  SpO2: 99%   Filed Weights   08/19/17 0911  Weight: 121 lb 11.2 oz (55.2 kg)    GENERAL:alert, no distress and comfortable SKIN: skin  color, texture, turgor are normal, no rashes or significant lesions EYES: normal, conjunctiva are pink and non-injected, sclera clear OROPHARYNX:no exudate, no erythema and lips, buccal mucosa, and tongue normal  NECK: supple, thyroid normal size, non-tender, without nodularity LYMPH:  no palpable lymphadenopathy in the cervical, axillary or inguinal LUNGS: clear to auscultation and percussion with normal breathing effort HEART: regular rate & rhythm and no murmurs and no lower extremity edema ABDOMEN:abdomen soft, non-tender and normal bowel sounds Musculoskeletal:no cyanosis of digits and no clubbing  PSYCH: alert & oriented x 3 with fluent speech NEURO: no focal motor/sensory deficits BREAST:Postbiopsy changes (Exam performed in the presence of a chaperone)   RADIOGRAPHIC STUDIES: I have personally reviewed the radiological reports and agreed with the findings in the report.  ASSESSMENT AND PLAN:  Ductal carcinoma in situ (DCIS) of left breast 08/10/17 Left breast biopsy: Subareolar DCIS with calcifications, upper-outer quadrant DCIS with calcifications, ER 100%, PR 60%, high-grade, Tis Nx stage 0  Pathology review: I discussed with the patient the difference between DCIS and invasive breast cancer. It is considered a precancerous lesion. DCIS is classified as a 0. It is generally detected through mammograms as calcifications. We discussed the significance of grades and its impact on prognosis. We also discussed the importance of ER and PR receptors and their implications to adjuvant treatment options. Prognosis of DCIS dependence on grade, comedo necrosis. It is anticipated that if not treated, 20-30% of DCIS can develop into invasive breast cancer.  Recommendation: 1. Mastectomy 2. Followed by antiestrogen therapy with tamoxifen 5 years vs anastrozole 1 mg daily 5 years   Tamoxifen counseling: We discussed the risks and benefits of tamoxifen. These include but not limited to insomnia,  hot flashes, mood changes, vaginal dryness, and weight gain. Although rare, serious side effects including endometrial cancer, risk of blood clots were also discussed. We strongly believe that the benefits far outweigh the risks. Patient understands these risks and consented to starting treatment. Planned treatment duration is 5 years.  MOld: Patient believes that the DCIS was caused because of her toxic mold exposure. I discussed with her about stopping progesterone therapy but she is not currently willing to do that.  Return to clinic after surgery to discuss the final pathology report and to decide on final antiestrogen therapy plan   All questions were answered. The patient knows to call the clinic with any problems, questions or concerns.    Rulon Eisenmenger, MD 08/19/17

## 2017-08-19 NOTE — Assessment & Plan Note (Signed)
08/10/17 Left breast biopsy: Subareolar DCIS with calcifications, upper-outer quadrant DCIS with calcifications, ER 100%, PR 60%, high-grade, Tis Nx stage 0  Pathology review: I discussed with the patient the difference between DCIS and invasive breast cancer. It is considered a precancerous lesion. DCIS is classified as a 0. It is generally detected through mammograms as calcifications. We discussed the significance of grades and its impact on prognosis. We also discussed the importance of ER and PR receptors and their implications to adjuvant treatment options. Prognosis of DCIS dependence on grade, comedo necrosis. It is anticipated that if not treated, 20-30% of DCIS can develop into invasive breast cancer.  Recommendation: 1. Mastectomy 2. Followed by antiestrogen therapy with tamoxifen 5 years vs anastrozole 1 mg daily 5 years   Tamoxifen counseling: We discussed the risks and benefits of tamoxifen. These include but not limited to insomnia, hot flashes, mood changes, vaginal dryness, and weight gain. Although rare, serious side effects including endometrial cancer, risk of blood clots were also discussed. We strongly believe that the benefits far outweigh the risks. Patient understands these risks and consented to starting treatment. Planned treatment duration is 5 years.  Return to clinic after surgery to discuss the final pathology report and to decide on final antiestrogen therapy plan  Return to clinic after surgery to discuss the final pathology report and come up with an adjuvant treatment plan.

## 2017-08-27 ENCOUNTER — Telehealth: Payer: Self-pay | Admitting: Hematology and Oncology

## 2017-08-27 NOTE — Telephone Encounter (Signed)
Left message for patient regarding upcoming November appointments.  °

## 2017-09-01 NOTE — Pre-Procedure Instructions (Signed)
    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. [place] [address] Phone: [phone] Fax: [phone]    Your procedure is scheduled on Monday, September 06, 2017  Report to [facility] Admitting at 7:00 A.M.  Call this number if you have problems the morning of surgery:  [phone]   Remember: Drink your Boost Breeze 2 hours prior to your arrival to the hospital  Do not eat food or drink liquids after midnight Sunday, September 05, 2017  Take these medicines the morning of surgery with A SIP OF WATER : none Stop taking Aspirin, vitamins, fish oil and herbal medications. Do not take any NSAIDs ie: Ibuprofen, Advil, Naproxen (Aleve), Motrin, BC and Goody Powder or any medication containing Aspirin; stop now.  Do not wear jewelry, make-up or nail polish.  Do not wear lotions, powders, or perfumes, or deodorant.  Do not shave 48 hours prior to surgery.    Do not bring valuables to the hospital.  [facility] is not responsible for any belongings or valuables. Contacts, dentures or bridgework may not be worn into surgery.  Leave your suitcase in the car.  After surgery it may be brought to your room. For patients admitted to the hospital, discharge time will be determined by your treatment team. Patients discharged the day of surgery will not be allowed to drive home.  Special instructions: Shower the night before surgery and the morning of surgery with CHG. Please read over the following fact sheets that you were given. Pain Booklet, Coughing and Deep Breathing and Surgical Site Infection Prevention

## 2017-09-02 ENCOUNTER — Other Ambulatory Visit (HOSPITAL_COMMUNITY): Payer: Self-pay | Admitting: *Deleted

## 2017-09-02 ENCOUNTER — Encounter: Payer: Self-pay | Admitting: Family Medicine

## 2017-09-02 ENCOUNTER — Ambulatory Visit (INDEPENDENT_AMBULATORY_CARE_PROVIDER_SITE_OTHER): Payer: PPO | Admitting: Family Medicine

## 2017-09-02 ENCOUNTER — Encounter (HOSPITAL_COMMUNITY): Payer: Self-pay

## 2017-09-02 ENCOUNTER — Encounter (HOSPITAL_COMMUNITY)
Admission: RE | Admit: 2017-09-02 | Discharge: 2017-09-02 | Disposition: A | Discharge status: Home / Self Care | Payer: PPO | Source: Ambulatory Visit | Attending: General Surgery | Admitting: General Surgery

## 2017-09-02 VITALS — BP 110/68 | HR 65 | Ht 66.0 in | Wt 122.0 lb

## 2017-09-02 DIAGNOSIS — G8929 Other chronic pain: Secondary | ICD-10-CM

## 2017-09-02 DIAGNOSIS — M999 Biomechanical lesion, unspecified: Secondary | ICD-10-CM

## 2017-09-02 DIAGNOSIS — M545 Low back pain, unspecified: Secondary | ICD-10-CM

## 2017-09-02 DIAGNOSIS — Z01812 Encounter for preprocedural laboratory examination: Secondary | ICD-10-CM | POA: Diagnosis not present

## 2017-09-02 HISTORY — DX: Major depressive disorder, single episode, unspecified: F32.9

## 2017-09-02 HISTORY — DX: Anxiety disorder, unspecified: F41.9

## 2017-09-02 HISTORY — DX: Hypothyroidism, unspecified: E03.9

## 2017-09-02 HISTORY — DX: Unspecified asthma, uncomplicated: J45.909

## 2017-09-02 HISTORY — DX: Depression, unspecified: F32.A

## 2017-09-02 LAB — BASIC METABOLIC PANEL
ANION GAP: 10 (ref 5–15)
BUN: 17 mg/dL (ref 6–20)
CHLORIDE: 107 mmol/L (ref 101–111)
CO2: 23 mmol/L (ref 22–32)
Calcium: 9.6 mg/dL (ref 8.9–10.3)
Creatinine, Ser: 0.83 mg/dL (ref 0.44–1.00)
GFR calc Af Amer: >60 mL/min (ref >60)
GFR calc non Af Amer: >60 mL/min (ref >60)
GLUCOSE: 101 mg/dL — AB (ref 65–99)
POTASSIUM: 3.8 mmol/L (ref 3.5–5.1)
Sodium: 140 mmol/L (ref 135–145)

## 2017-09-02 LAB — CBC
HEMATOCRIT: 38.5 % (ref 36.0–46.0)
HEMOGLOBIN: 12.3 g/dL (ref 12.0–15.0)
MCH: 29.2 pg (ref 26.0–34.0)
MCHC: 31.9 g/dL (ref 30.0–36.0)
MCV: 91.4 fL (ref 78.0–100.0)
Platelets: 250 10*3/uL (ref 150–400)
RBC: 4.21 MIL/uL (ref 3.87–5.11)
RDW: 13.4 % (ref 11.5–15.5)
WBC: 6.6 10*3/uL (ref 4.0–10.5)

## 2017-09-02 NOTE — Assessment & Plan Note (Signed)
I do not believe that there is any type of possible metastasis. We did discuss the possibility of x-rays to further evaluate for any type of metastasis the patient declined. Especially with patient having the carcinoma in situ. Patient is going to see me again when all is done with her postsurgical care. Patient call if she has any questions or concerns.

## 2017-09-02 NOTE — Assessment & Plan Note (Signed)
Decision today to treat with OMT was based on Physical Exam  After verbal consent patient was treated with HVLA, ME, FPR techniques in cervical, thoracic, rib, lumbar and sacral areas  Patient tolerated the procedure well with improvement in symptoms  Patient given exercises, stretches and lifestyle modifications  See medications in patient instructions if given  Patient will follow up in 6 weeks 

## 2017-09-02 NOTE — Progress Notes (Signed)
Pt. States that her consent should be for a bilateral mastectomy,Dr. Toth's office notified.   Pt. States that she is not going to drink the Boost for the ERAS protocol because it has too many things that she cannot have in it. States that she has something at home she can mix up that will have a lot of protein and carbs. Told pt. To make sure it was a clear liquid. Office notified.

## 2017-09-02 NOTE — Progress Notes (Signed)
[name] [address] [place], [place] [address] Phone: [phone] Subjective:    I'm seeing this patient by the request  of:    CC: Neck and back pain follow-up.  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 Patient recently has been diagnosed with more of a breast cancer. Patient states she has left mastectomy scheduled for October 28.  Patient states that overall she is doing relatively well. Some tightness. Complication of manipulation before the surgery to help her with any type healing. Patient has ideas of how she is going to do well postsurgical.       Past Medical History:  Diagnosis Date  . Anxiety   . Arthritis   . Asthma    excerise induced  . Cancer [facility])    breast cancer  . Depression   . Hypothyroidism    Past Surgical History:  Procedure Laterality Date  . APPENDECTOMY  1983  . CHOLECYSTECTOMY  1975  . TONSILLECTOMY AND ADENOIDECTOMY     Social History   Social History  . Marital status: Divorced    Spouse name: N/A  . Number of children: N/A  . Years of education: N/A   Social History Main Topics  . Smoking status: Former Smoker    Packs/day: 1.00    Years: 10.00    Types: Cigarettes    Quit date: 60  . Smokeless tobacco: Never Used  . Alcohol use Yes     Comment: rarely  . Drug use: No  . Sexual activity: Not on file   Other Topics Concern  . Not on file   Social History Narrative  . No narrative on file   Allergies  Allergen Reactions  . Sulfonamide Derivatives     REACTION: rash   Family History  Problem Relation Age of Onset  . Cancer Mother   . Hyperlipidemia Mother   . Cancer Father   . Hyperlipidemia Father   . Birth defects Maternal Grandmother   . Birth defects Paternal Grandmother      Past medical history, social, surgical and family history all reviewed in electronic medical record.  No pertanent  information unless stated regarding to the chief complaint.   Review of Systems:Review of systems updated and as accurate as of 09/02/17  No headache, visual changes, nausea, vomiting, diarrhea, constipation, dizziness, abdominal pain, skin rash, fevers, chills, night sweats, weight loss, swollen lymph nodes, body aches, joint swelling,  chest pain, shortness of breath, mood changes. Positive muscle aches  Objective  There were no vitals taken for this visit. Systems examined below as of 09/02/17   General: No apparent distress alert and oriented x3 mood and affect normal, dressed appropriately.  HEENT: Pupils equal, extraocular movements intact  Respiratory: Patient's speak in full sentences and does not appear short of breath  Cardiovascular: No lower extremity edema, non tender, no erythema  Skin: Warm dry intact with no signs of infection or rash on extremities or on axial skeleton.  Abdomen: Soft nontender  Neuro: Cranial nerves II through XII are intact, neurovascularly intact in all extremities with 2+ DTRs and 2+ pulses.  Lymph: No lymphadenopathy of posterior or anterior cervical chain or axillae bilaterally.  Gait normal with good balance and coordination.  MSK:  Non tender with full range of motion and good stability and symmetric strength and tone of shoulders, elbows, wrist, hip, knee and ankles bilaterally. Mild arthritic  changes. Back Exam:  Inspection: Mild degenerative scoliosis Motion: Flexion 45 deg, Extension 25 deg, Side Bending to 35 deg bilaterally,  Rotation to 45 deg bilaterally  SLR laying: Negative  XSLR laying: Negative  Palpable tenderness: Tender to palpation of the paraspinal musculature lumbar spine. FABER: Tightness bilaterally. Sensory change: Gross sensation intact to all lumbar and sacral dermatomes.  Reflexes: 2+ at both patellar tendons, 2+ at achilles tendons, Babinski's downgoing.  Strength at foot  Plantar-flexion: 5/5 Dorsi-flexion: 5/5  Eversion: 5/5 Inversion: 5/5  Leg strength  Quad: 5/5 Hamstring: 5/5 Hip flexor: 5/5 Hip abductors: 5/5  Gait unremarkable.  Osteopathic findings C2 flexed rotated and side bent right C4 flexed rotated and side bent left C7 flexed rotated and side bent left T3 extended rotated and side bent right inhaled third rib T9 extended rotated and side bent left L3 flexed rotated and side bent right Sacrum right on right     Impression and Recommendations:     This case required medical decision making of moderate complexity.      Note: This dictation was prepared with Dragon dictation along with smaller phrase technology. Any transcriptional errors that result from this process are unintentional.

## 2017-09-03 ENCOUNTER — Ambulatory Visit: Payer: Self-pay | Admitting: General Surgery

## 2017-09-03 NOTE — Progress Notes (Signed)
Anesthesia Chart Review:  Pt is a 66 year old female scheduled for L mastectomy with sentinel lymph node biopsy and R prophylactic mastectomy on 09/06/2017 with [name], MD  - PCP is [name], MD - Oncologist is Nicholas Lose, MD  PMH includes:  Exercise induced asthma, hypothyroidism, breast cancer.  Former smoker. BMI 20  Medications include: nature-throid.   BP (!) 113/44 Comment: notified Doctor, hospital  Pulse 65   Temp 36.8 C   Resp 18   Ht 5\' 6"  (1.676 m)   Wt 122 lb (55.3 kg)   SpO2 100%   BMI 19.69 kg/m  - pt reports this BP is typical for her  Preoperative labs reviewed.    Asked to see pt in pre-admission testing.  Pt has several requests perioperatively.   1. She would like a towel placed under her upper back while in the OR to slightly arch her back during surgery. I spoke with Ivin Booty (general surgery OR coordinator) 09/03/17 to make her aware of this request  2. Pt would like acupuncturist to see her in the hospital for post-op pain management.  Rolla Flatten checked with Medical Staff Services and no acupuncturists have privileges at [facility].  I notified pt acupuncture while inpatient would not be possible.   3. Pt wants IV vitamin C plus other IV nutrients perioperatively.  Pt is to provide recipe for mixture she desires.   - Depending on ingredient list, Dr. Marlou Starks is willing to write for it.   - I discuss this with anesthesiologist Dr. Nyoka Cowden who states an ingredient list would need to be available prior to agreeing to its use while under anesthesia.   - As of 4:50pm 09/03/17, I do not have ingredient list from patient.  - I spoke with Maudie Mercury in pharmacy about this as well [phone]).  Pharmacy also needs an ingredient list before decision to proceed can be determined.  Question is who pays for special IV mix.  Pt believes Medicare will pay.  Pharmacy may be able to determine this once ingredient list known.    Willeen Cass, FNP-BC [facility]  Center/Anesthesiology Phone: [phone] 09/03/2017 4:51 PM

## 2017-09-04 DIAGNOSIS — E039 Hypothyroidism, unspecified: Secondary | ICD-10-CM | POA: Diagnosis not present

## 2017-09-04 DIAGNOSIS — E785 Hyperlipidemia, unspecified: Secondary | ICD-10-CM | POA: Diagnosis not present

## 2017-09-04 DIAGNOSIS — R5382 Chronic fatigue, unspecified: Secondary | ICD-10-CM | POA: Diagnosis not present

## 2017-09-04 DIAGNOSIS — E069 Thyroiditis, unspecified: Secondary | ICD-10-CM | POA: Diagnosis not present

## 2017-09-06 ENCOUNTER — Encounter (HOSPITAL_COMMUNITY)
Admission: RE | Disposition: A | Discharge status: Home / Self Care | Payer: Self-pay | Source: Ambulatory Visit | Attending: General Surgery

## 2017-09-06 ENCOUNTER — Ambulatory Visit (HOSPITAL_COMMUNITY): Payer: PPO | Admitting: Anesthesiology

## 2017-09-06 ENCOUNTER — Ambulatory Visit (HOSPITAL_COMMUNITY)
Admission: RE | Admit: 2017-09-06 | Discharge: 2017-09-07 | Disposition: A | Discharge status: Home / Self Care | Payer: PPO | Source: Ambulatory Visit | Attending: General Surgery | Admitting: General Surgery

## 2017-09-06 ENCOUNTER — Encounter (HOSPITAL_COMMUNITY): Payer: Self-pay | Admitting: Anesthesiology

## 2017-09-06 ENCOUNTER — Ambulatory Visit (HOSPITAL_COMMUNITY)
Admission: RE | Admit: 2017-09-06 | Discharge: 2017-09-06 | Disposition: A | Discharge status: Home / Self Care | Payer: PPO | Source: Ambulatory Visit | Attending: General Surgery | Admitting: General Surgery

## 2017-09-06 ENCOUNTER — Ambulatory Visit (HOSPITAL_COMMUNITY): Payer: PPO | Admitting: Emergency Medicine

## 2017-09-06 DIAGNOSIS — E039 Hypothyroidism, unspecified: Secondary | ICD-10-CM | POA: Diagnosis not present

## 2017-09-06 DIAGNOSIS — J45990 Exercise induced bronchospasm: Secondary | ICD-10-CM | POA: Insufficient documentation

## 2017-09-06 DIAGNOSIS — Z79899 Other long term (current) drug therapy: Secondary | ICD-10-CM | POA: Insufficient documentation

## 2017-09-06 DIAGNOSIS — Z836 Family history of other diseases of the respiratory system: Secondary | ICD-10-CM | POA: Diagnosis not present

## 2017-09-06 DIAGNOSIS — K8020 Calculus of gallbladder without cholecystitis without obstruction: Secondary | ICD-10-CM | POA: Diagnosis not present

## 2017-09-06 DIAGNOSIS — G8918 Other acute postprocedural pain: Secondary | ICD-10-CM | POA: Diagnosis not present

## 2017-09-06 DIAGNOSIS — G9349 Other encephalopathy: Secondary | ICD-10-CM | POA: Diagnosis not present

## 2017-09-06 DIAGNOSIS — Z808 Family history of malignant neoplasm of other organs or systems: Secondary | ICD-10-CM | POA: Insufficient documentation

## 2017-09-06 DIAGNOSIS — Z8349 Family history of other endocrine, nutritional and metabolic diseases: Secondary | ICD-10-CM | POA: Diagnosis not present

## 2017-09-06 DIAGNOSIS — Z79890 Hormone replacement therapy (postmenopausal): Secondary | ICD-10-CM | POA: Diagnosis not present

## 2017-09-06 DIAGNOSIS — F418 Other specified anxiety disorders: Secondary | ICD-10-CM | POA: Diagnosis not present

## 2017-09-06 DIAGNOSIS — D0512 Intraductal carcinoma in situ of left breast: Secondary | ICD-10-CM | POA: Diagnosis present

## 2017-09-06 DIAGNOSIS — N6011 Diffuse cystic mastopathy of right breast: Secondary | ICD-10-CM | POA: Diagnosis not present

## 2017-09-06 DIAGNOSIS — Z818 Family history of other mental and behavioral disorders: Secondary | ICD-10-CM | POA: Diagnosis not present

## 2017-09-06 DIAGNOSIS — Z882 Allergy status to sulfonamides status: Secondary | ICD-10-CM | POA: Insufficient documentation

## 2017-09-06 DIAGNOSIS — Z87891 Personal history of nicotine dependence: Secondary | ICD-10-CM | POA: Insufficient documentation

## 2017-09-06 DIAGNOSIS — M549 Dorsalgia, unspecified: Secondary | ICD-10-CM | POA: Insufficient documentation

## 2017-09-06 DIAGNOSIS — J45909 Unspecified asthma, uncomplicated: Secondary | ICD-10-CM | POA: Diagnosis not present

## 2017-09-06 DIAGNOSIS — Z77120 Contact with and (suspected) exposure to mold (toxic): Secondary | ICD-10-CM | POA: Diagnosis not present

## 2017-09-06 DIAGNOSIS — Z803 Family history of malignant neoplasm of breast: Secondary | ICD-10-CM | POA: Insufficient documentation

## 2017-09-06 DIAGNOSIS — Z170 Estrogen receptor positive status [ER+]: Secondary | ICD-10-CM | POA: Diagnosis not present

## 2017-09-06 DIAGNOSIS — D0590 Unspecified type of carcinoma in situ of unspecified breast: Secondary | ICD-10-CM | POA: Diagnosis present

## 2017-09-06 DIAGNOSIS — Z800 Family history of malignant neoplasm of digestive organs: Secondary | ICD-10-CM | POA: Diagnosis not present

## 2017-09-06 HISTORY — PX: MASTECTOMY W/ SENTINEL NODE BIOPSY: SHX2001

## 2017-09-06 SURGERY — MASTECTOMY WITH SENTINEL LYMPH NODE BIOPSY
Anesthesia: Regional | Site: Breast | Laterality: Bilateral

## 2017-09-06 MED ORDER — CHLORHEXIDINE GLUCONATE CLOTH 2 % EX PADS: 6.0000 | MEDICATED_PAD | Freq: Once | CUTANEOUS | Status: DC

## 2017-09-06 MED ORDER — ONDANSETRON HCL 4 MG/2ML IJ SOLN: 4.0000 mg | Freq: Four times a day (QID) | INTRAMUSCULAR | Status: DC | PRN

## 2017-09-06 MED ORDER — CEFAZOLIN SODIUM-DEXTROSE 2-4 GM/100ML-% IV SOLN
2.0000 g | INTRAVENOUS | Status: AC
  Administered 2017-09-06: 2 g via INTRAVENOUS
  Filled 2017-09-06: qty 100

## 2017-09-06 MED ORDER — LACTATED RINGERS IV SOLN
INTRAVENOUS | Status: DC
Start: 2017-09-06 — End: 2017-09-07
  Administered 2017-09-06: 09:00:00 via INTRAVENOUS

## 2017-09-06 MED ORDER — ROPIVACAINE HCL 5 MG/ML IJ SOLN
INTRAMUSCULAR | Status: DC | PRN
  Administered 2017-09-06: 30 mL via PERINEURAL

## 2017-09-06 MED ORDER — 0.9 % SODIUM CHLORIDE (POUR BTL) OPTIME
TOPICAL | Status: DC | PRN
Start: 2017-09-06 — End: 2017-09-06
  Administered 2017-09-06: 1000 mL

## 2017-09-06 MED ORDER — HEPARIN SODIUM (PORCINE) 5000 UNIT/ML IJ SOLN
5000.0000 [IU] | Freq: Three times a day (TID) | INTRAMUSCULAR | Status: DC
  Administered 2017-09-07: 5000 [IU] via SUBCUTANEOUS
  Filled 2017-09-06: qty 1

## 2017-09-06 MED ORDER — ONDANSETRON 4 MG PO TBDP: 4.0000 mg | ORAL_TABLET | Freq: Four times a day (QID) | ORAL | Status: DC | PRN

## 2017-09-06 MED ORDER — MORPHINE SULFATE (PF) 4 MG/ML IV SOLN: 1.0000 mg | INTRAVENOUS | Status: DC | PRN

## 2017-09-06 MED ORDER — LIDOCAINE 2% (20 MG/ML) 5 ML SYRINGE
INTRAMUSCULAR | Status: AC
  Filled 2017-09-06: qty 5

## 2017-09-06 MED ORDER — THIAMINE HCL 100 MG/ML IJ SOLN
INTRAVENOUS | Status: DC
  Filled 2017-09-06: qty 1000

## 2017-09-06 MED ORDER — HYDROCODONE-ACETAMINOPHEN 5-325 MG PO TABS
1.0000 | ORAL_TABLET | ORAL | Status: DC | PRN
  Administered 2017-09-06 – 2017-09-07 (×3): 1 via ORAL
  Filled 2017-09-06 (×2): qty 1

## 2017-09-06 MED ORDER — CEFAZOLIN SODIUM-DEXTROSE 2-4 GM/100ML-% IV SOLN: 2.0000 g | INTRAVENOUS | Status: DC

## 2017-09-06 MED ORDER — THYROID 30 MG PO TABS
130.0000 mg | ORAL_TABLET | Freq: Every day | ORAL | Status: DC
  Filled 2017-09-06: qty 1

## 2017-09-06 MED ORDER — M.V.I. ADULT IV INJ
INJECTION | INTRAVENOUS | Status: AC
Start: 2017-09-06 — End: 2017-09-06
  Administered 2017-09-06: 10:00:00 via INTRAVENOUS
  Administered 2017-09-06: 1000 mL via INTRAVENOUS
  Filled 2017-09-06: qty 1000

## 2017-09-06 MED ORDER — KCL IN DEXTROSE-NACL 20-5-0.9 MEQ/L-%-% IV SOLN
INTRAVENOUS | Status: DC
  Administered 2017-09-06: 18:00:00 via INTRAVENOUS
  Filled 2017-09-06 (×2): qty 1000

## 2017-09-06 MED ORDER — TECHNETIUM TC 99M SULFUR COLLOID FILTERED
1.0000 | Freq: Once | INTRAVENOUS | Status: AC | PRN
  Administered 2017-09-06: 1 via INTRADERMAL

## 2017-09-06 MED ORDER — DEXAMETHASONE SODIUM PHOSPHATE 10 MG/ML IJ SOLN
INTRAMUSCULAR | Status: AC
  Filled 2017-09-06: qty 1

## 2017-09-06 MED ORDER — PROPOFOL 10 MG/ML IV BOLUS
INTRAVENOUS | Status: AC
  Filled 2017-09-06: qty 40

## 2017-09-06 MED ORDER — ONDANSETRON HCL 4 MG/2ML IJ SOLN
INTRAMUSCULAR | Status: AC
  Filled 2017-09-06: qty 2

## 2017-09-06 MED ORDER — FENTANYL CITRATE (PF) 100 MCG/2ML IJ SOLN
INTRAMUSCULAR | Status: AC
  Filled 2017-09-06: qty 2

## 2017-09-06 MED ORDER — SODIUM CHLORIDE FLUSH 0.9 % IV SOLN
INTRAVENOUS | Status: AC
  Filled 2017-09-06: qty 10

## 2017-09-06 MED ORDER — PANTOPRAZOLE SODIUM 40 MG IV SOLR
40.0000 mg | Freq: Every day | INTRAVENOUS | Status: DC
Start: 2017-09-06 — End: 2017-09-07
  Administered 2017-09-06: 40 mg via INTRAVENOUS
  Filled 2017-09-06: qty 40

## 2017-09-06 MED ORDER — SUGAMMADEX SODIUM 500 MG/5ML IV SOLN
INTRAVENOUS | Status: AC
  Filled 2017-09-06: qty 5

## 2017-09-06 MED ORDER — HYDROCODONE-ACETAMINOPHEN 5-325 MG PO TABS: 1.0000 | ORAL_TABLET | Freq: Four times a day (QID) | ORAL | 0 refills | Status: DC | PRN

## 2017-09-06 MED ORDER — TRAZODONE HCL 100 MG PO TABS
100.0000 mg | ORAL_TABLET | Freq: Every day | ORAL | Status: DC
  Administered 2017-09-06: 100 mg via ORAL
  Filled 2017-09-06: qty 1

## 2017-09-06 MED ORDER — FENTANYL CITRATE (PF) 250 MCG/5ML IJ SOLN
INTRAMUSCULAR | Status: AC
  Filled 2017-09-06: qty 5

## 2017-09-06 MED ORDER — SUGAMMADEX SODIUM 200 MG/2ML IV SOLN
INTRAVENOUS | Status: AC
  Filled 2017-09-06: qty 2

## 2017-09-06 MED ORDER — EPHEDRINE SULFATE 50 MG/ML IJ SOLN
INTRAMUSCULAR | Status: DC | PRN
  Administered 2017-09-06 (×5): 10 mg via INTRAVENOUS

## 2017-09-06 MED ORDER — FENTANYL CITRATE (PF) 100 MCG/2ML IJ SOLN
INTRAMUSCULAR | Status: DC | PRN
  Administered 2017-09-06 (×2): 50 ug via INTRAVENOUS
  Administered 2017-09-06 (×3): 25 ug via INTRAVENOUS

## 2017-09-06 MED ORDER — MIDAZOLAM HCL 2 MG/2ML IJ SOLN
INTRAMUSCULAR | Status: AC
  Filled 2017-09-06: qty 2

## 2017-09-06 MED ORDER — METHOCARBAMOL 500 MG PO TABS: 500.0000 mg | ORAL_TABLET | Freq: Four times a day (QID) | ORAL | Status: DC | PRN

## 2017-09-06 MED ORDER — THYROID 60 MG PO TABS
120.0000 mg | ORAL_TABLET | Freq: Every day | ORAL | Status: DC
  Filled 2017-09-06: qty 2

## 2017-09-06 MED ORDER — HYDROCODONE-ACETAMINOPHEN 5-325 MG PO TABS
ORAL_TABLET | ORAL | Status: AC
  Administered 2017-09-06: 1 via ORAL
  Filled 2017-09-06: qty 1

## 2017-09-06 MED ORDER — GABAPENTIN 300 MG PO CAPS
300.0000 mg | ORAL_CAPSULE | ORAL | Status: DC
  Filled 2017-09-06: qty 1

## 2017-09-06 MED ORDER — PROPOFOL 10 MG/ML IV BOLUS
INTRAVENOUS | Status: DC | PRN
  Administered 2017-09-06: 170 mg via INTRAVENOUS

## 2017-09-06 MED ORDER — FENTANYL CITRATE (PF) 100 MCG/2ML IJ SOLN
25.0000 ug | INTRAMUSCULAR | Status: DC | PRN
  Administered 2017-09-06 (×2): 25 ug via INTRAVENOUS

## 2017-09-06 MED ORDER — MIDAZOLAM HCL 5 MG/5ML IJ SOLN
INTRAMUSCULAR | Status: DC | PRN
  Administered 2017-09-06: 2 mg via INTRAVENOUS

## 2017-09-06 MED ORDER — ACETAMINOPHEN 500 MG PO TABS
1000.0000 mg | ORAL_TABLET | ORAL | Status: DC
  Filled 2017-09-06: qty 2

## 2017-09-06 MED ORDER — LIDOCAINE 2% (20 MG/ML) 5 ML SYRINGE
INTRAMUSCULAR | Status: DC | PRN
  Administered 2017-09-06: 60 mg via INTRAVENOUS

## 2017-09-06 MED ORDER — THIAMINE HCL 100 MG/ML IJ SOLN
Freq: Once | INTRAVENOUS | Status: AC
  Administered 2017-09-07: 06:00:00 via INTRAVENOUS
  Filled 2017-09-06: qty 1000

## 2017-09-06 MED ORDER — ONDANSETRON HCL 4 MG/2ML IJ SOLN
INTRAMUSCULAR | Status: DC | PRN
  Administered 2017-09-06: 4 mg via INTRAVENOUS

## 2017-09-06 MED ORDER — ACETAMINOPHEN 500 MG PO TABS
ORAL_TABLET | ORAL | Status: AC
  Filled 2017-09-06: qty 2

## 2017-09-06 MED ORDER — ADULT MULTIVITAMIN W/MINERALS CH: 1.0000 | ORAL_TABLET | Freq: Every day | ORAL | Status: DC

## 2017-09-06 MED ORDER — METHYLENE BLUE 0.5 % INJ SOLN
INTRAVENOUS | Status: AC
  Filled 2017-09-06: qty 10

## 2017-09-06 MED ORDER — ONDANSETRON HCL 4 MG/2ML IJ SOLN: 4.0000 mg | Freq: Once | INTRAMUSCULAR | Status: DC | PRN

## 2017-09-06 SURGICAL SUPPLY — 45 items
APPLIER CLIP 9.375 MED OPEN (MISCELLANEOUS) ×2
BINDER BREAST LRG (GAUZE/BANDAGES/DRESSINGS) IMPLANT
BINDER BREAST XLRG (GAUZE/BANDAGES/DRESSINGS) IMPLANT
BIOPATCH RED 1 DISK 7.0 (GAUZE/BANDAGES/DRESSINGS) ×4 IMPLANT
CANISTER SUCT 3000ML PPV (MISCELLANEOUS) ×2 IMPLANT
CHLORAPREP W/TINT 26ML (MISCELLANEOUS) ×2 IMPLANT
CLIP APPLIE 9.375 MED OPEN (MISCELLANEOUS) ×1 IMPLANT
CONT SPEC 4OZ CLIKSEAL STRL BL (MISCELLANEOUS) ×2 IMPLANT
COVER PROBE W GEL 5X96 (DRAPES) ×2 IMPLANT
COVER SURGICAL LIGHT HANDLE (MISCELLANEOUS) ×2 IMPLANT
DERMABOND ADVANCED (GAUZE/BANDAGES/DRESSINGS) ×1
DERMABOND ADVANCED .7 DNX12 (GAUZE/BANDAGES/DRESSINGS) ×1 IMPLANT
DEVICE DISSECT PLASMABLAD 3.0S (MISCELLANEOUS) ×1 IMPLANT
DRAIN CHANNEL 19F RND (DRAIN) ×2 IMPLANT
DRAPE CHEST BREAST 15X10 FENES (DRAPES) ×2 IMPLANT
DRSG PAD ABDOMINAL 8X10 ST (GAUZE/BANDAGES/DRESSINGS) ×2 IMPLANT
DRSG TEGADERM 4X4.75 (GAUZE/BANDAGES/DRESSINGS) ×2 IMPLANT
ELECT CAUTERY BLADE 6.4 (BLADE) ×2 IMPLANT
ELECT REM PT RETURN 9FT ADLT (ELECTROSURGICAL) ×2
ELECTRODE REM PT RTRN 9FT ADLT (ELECTROSURGICAL) ×1 IMPLANT
EVACUATOR SILICONE 100CC (DRAIN) ×2 IMPLANT
GLOVE BIO SURGEON STRL SZ7.5 (GLOVE) ×2 IMPLANT
GOWN STRL REUS W/ TWL LRG LVL3 (GOWN DISPOSABLE) ×2 IMPLANT
GOWN STRL REUS W/TWL LRG LVL3 (GOWN DISPOSABLE) ×2
KIT BASIN OR (CUSTOM PROCEDURE TRAY) ×2 IMPLANT
KIT ROOM TURNOVER OR (KITS) ×2 IMPLANT
LIGHT WAVEGUIDE WIDE FLAT (MISCELLANEOUS) IMPLANT
NEEDLE 18GX1X1/2 (RX/OR ONLY) (NEEDLE) IMPLANT
NEEDLE FILTER BLUNT 18X 1/2SAF (NEEDLE)
NEEDLE FILTER BLUNT 18X1 1/2 (NEEDLE) IMPLANT
NEEDLE HYPO 25GX1X1/2 BEV (NEEDLE) IMPLANT
NS IRRIG 1000ML POUR BTL (IV SOLUTION) ×2 IMPLANT
PACK GENERAL/GYN (CUSTOM PROCEDURE TRAY) ×2 IMPLANT
PAD ARMBOARD 7.5X6 YLW CONV (MISCELLANEOUS) ×2 IMPLANT
PLASMABLADE 3.0S (MISCELLANEOUS) ×2
SPECIMEN JAR X LARGE (MISCELLANEOUS) ×2 IMPLANT
SUT ETHILON 3 0 FSL (SUTURE) ×4 IMPLANT
SUT MNCRL AB 4-0 PS2 18 (SUTURE) ×8 IMPLANT
SUT VIC AB 3-0 54X BRD REEL (SUTURE) IMPLANT
SUT VIC AB 3-0 BRD 54 (SUTURE)
SUT VIC AB 3-0 SH 18 (SUTURE) ×2 IMPLANT
SYR CONTROL 10ML LL (SYRINGE) IMPLANT
TOWEL OR 17X24 6PK STRL BLUE (TOWEL DISPOSABLE) ×2 IMPLANT
TOWEL OR 17X26 10 PK STRL BLUE (TOWEL DISPOSABLE) ×2 IMPLANT
TUBE CONNECTING 12X1/4 (SUCTIONS) IMPLANT

## 2017-09-06 NOTE — Interval H&P Note (Signed)
History and Physical Interval Note:  09/06/2017 8:35 AM  [name], MD  has presented today for surgery, with the diagnosis of LEFT BREAST DCIS  The various methods of treatment have been discussed with the patient and family. After consideration of risks, benefits and other options for treatment, the patient has consented to  Procedure([name]): LEFT MASTECTOMY WITH LEFT SENTINEL LYMPH NODE BIOPSY AND  RIGHT PROPHYLACTIC MASTECTOMY (Bilateral) as a surgical intervention .  The patient'[name] history has been reviewed, patient examined, no change in status, stable for surgery.  I have reviewed the patient'[name] chart and labs.  Questions were answered to the patient'[name] satisfaction.     [name],Whitney [name]

## 2017-09-06 NOTE — H&P (Signed)
Whitney Files MD MD  Location: [facility] Surgery Patient #: [phone] DOB: 07-10-1951 Single / Language: [name] / Race: Undefined Female   History of Present Illness  The patient is a 66 year old female who presents with breast cancer. We're asked to see the patient in consultation by Dr. Ammie Ferrier to evaluate her for a new left breast cancer. 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 At that time she was found to have a 4 cm area of calcification in the upper outer left breast involving the retroareolar area. 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 Both were estrogen and progesterone receptor positive. She has been following herself in the office with her mammography since 2006. She also reports a significant exposure to mold with mold toxins in her urine over the last couple years. She has been also taking hormone replacement therapy for the last 20 years or so. She denies any pain in the breast. She denies any discharge from the nipple. She tried to treat this holistic D and on her 3 month follow-up mammogram there were more calcifications.   Past Surgical History Appendectomy  Breast Biopsy  Left. Gallbladder Surgery - Open   Diagnostic Studies History Colonoscopy  5-10 years ago Mammogram  within last year  Allergies Sulfa Antibiotics  Rash. Allergies Reconciled   Medication History  Nature-Throid (65MG  Tablet, Oral) Active. Progesterone Micronized (200MG  Capsule, Oral) Active. TraZODone HCl (100MG  Tablet, Oral) Active. Medications Reconciled  Social History Alcohol use  Occasional alcohol use. Caffeine use  Coffee, Tea. No drug use  Tobacco use  Former smoker.  Family History Breast Cancer  Brother. Cancer  Father. Depression  Mother. Hypertension  Father. Melanoma  Mother. Rectal Cancer  Sister. Respiratory  Condition  Father. Thyroid problems  Mother.  Pregnancy / Birth History Age at menarche  33 years. Age of menopause  [age] Contraceptive History  Oral contraceptives. Gravida  0 Para  0  Other Problems Back Pain  Breast Cancer  Cholelithiasis  Melanoma  Thyroid Disease     Review of Systems General Present- Fatigue and Night Sweats. Not Present- Appetite Loss, Chills, Fever, Weight Gain and Weight Loss. Skin Not Present- Change in Wart/Mole, Dryness, Hives, Jaundice, New Lesions, Non-Healing Wounds, Rash and Ulcer. HEENT Present- Seasonal Allergies. Not Present- Earache, Hearing Loss, Hoarseness, Nose Bleed, Oral Ulcers, Ringing in the Ears, Sinus Pain, Sore Throat, Visual Disturbances, Wears glasses/contact lenses and Yellow Eyes. Respiratory Not Present- Bloody sputum, Chronic Cough, Difficulty Breathing, Snoring and Wheezing. Breast Present- Breast Pain. Not Present- Breast Mass, Nipple Discharge and Skin Changes. Cardiovascular Not Present- Chest Pain, Difficulty Breathing Lying Down, Leg Cramps, Palpitations, Rapid Heart Rate, Shortness of Breath and Swelling of Extremities. Gastrointestinal Not Present- Abdominal Pain, Bloating, Bloody Stool, Change in Bowel Habits, Chronic diarrhea, Constipation, Difficulty Swallowing, Excessive gas, Gets full quickly at meals, Hemorrhoids, Indigestion, Nausea, Rectal Pain and Vomiting. Female Genitourinary Present- Urgency. Not Present- Frequency, Nocturia, Painful Urination and Pelvic Pain. Musculoskeletal Present- Muscle Pain. Not Present- Back Pain, Joint Pain, Joint Stiffness, Muscle Weakness and Swelling of Extremities. Neurological Present- Decreased Memory. Not Present- Fainting, Headaches, Numbness, Seizures, Tingling, Tremor, Trouble walking and Weakness. Psychiatric Present- Anxiety, Change in Sleep Pattern, Depression and Frequent crying. Not Present- Bipolar and Fearful. Endocrine Present- Cold Intolerance and Hot  flashes. Not Present- Excessive Hunger, Hair Changes, Heat Intolerance and New Diabetes. Hematology Not Present- Blood Thinners, Easy Bruising,  Excessive bleeding, Gland problems, HIV and Persistent Infections.  Vitals Weight: 122.4 lb Height: 66in Body Surface Area: 1.62 m Body Mass Index: 19.76 kg/m  Temp.: 97.110F  Pulse: 73 (Regular)  BP: 98/62 (Sitting, Left Arm, Standard)       Physical Exam General Mental Status-Alert. General Appearance-Consistent with stated age. Hydration-Well hydrated. Voice-Normal.  Head and Neck Head-normocephalic, atraumatic with no lesions or palpable masses. Trachea-midline. Thyroid Gland Characteristics - normal size and consistency.  Eye Eyeball - Bilateral-Extraocular movements intact. Sclera/Conjunctiva - Bilateral-No scleral icterus.  Chest and Lung Exam Chest and lung exam reveals -quiet, even and easy respiratory effort with no use of accessory muscles and on auscultation, normal breath sounds, no adventitious sounds and normal vocal resonance. Inspection Chest Wall - Normal. Back - normal.  Breast Note: There is some palpable bruising and fullness in the central and upper outer left breast. There is no palpable mass in the right breast. There is a small mobile palpable lymph node in both axillas.   Cardiovascular Cardiovascular examination reveals -normal heart sounds, regular rate and rhythm with no murmurs and normal pedal pulses bilaterally.  Abdomen Inspection Inspection of the abdomen reveals - No Hernias. Skin - Scar - no surgical scars. Palpation/Percussion Palpation and Percussion of the abdomen reveal - Soft, Non Tender, No Rebound tenderness, No Rigidity (guarding) and No hepatosplenomegaly. Auscultation Auscultation of the abdomen reveals - Bowel sounds normal.  Neurologic Neurologic evaluation reveals -alert and oriented x 3 with no impairment of recent or remote  memory. Mental Status-Normal.  Musculoskeletal Normal Exam - Left-Upper Extremity Strength Normal and Lower Extremity Strength Normal. Normal Exam - Right-Upper Extremity Strength Normal and Lower Extremity Strength Normal.  Lymphatic Head & Neck  General Head & Neck Lymphatics: Bilateral - Description - Normal. Axillary  General Axillary Region: Bilateral - Description - Normal. Tenderness - Non Tender. Femoral & Inguinal  Generalized Femoral & Inguinal Lymphatics: Bilateral - Description - Normal. Tenderness - Non Tender.    Assessment & Plan DUCTAL CARCINOMA IN SITU (DCIS) OF LEFT BREAST (D05.12) Impression: The patient appears to have a large area of ductal carcinoma in situ in the upper outer left breast. I have talked her in detail about the options for treatment and at this point we both feel that mastectomy is the best option. She is interested in reconstruction. I will go ahead and refer her to both medical oncology and plastic surgery and then we can coordinate her surgery from there. Current Plans Referred to Oncology, for evaluation and follow up (Oncology). Routine. Referred to Surgery - Plastic, for evaluation and follow up (Plastic Surgery). Routine. Pt Education - Breast cancer: discussed with patient and provided information.  She has elected for left mastectomy and sentinel node mapping and right prophylactic mastectomy

## 2017-09-06 NOTE — Anesthesia Procedure Notes (Addendum)
Procedure Name: LMA Insertion Date/Time: 09/06/2017 10:00 AM Performed by: Lieutenant Diego Pre-anesthesia Checklist: Patient identified, Emergency Drugs available, Suction available and Patient being monitored Patient Re-evaluated:Patient Re-evaluated prior to induction Oxygen Delivery Method: Circle system utilized Preoxygenation: Pre-oxygenation with 100% oxygen Induction Type: IV induction Ventilation: Mask ventilation without difficulty LMA: LMA inserted LMA Size: 3.0 Number of attempts: 1 Placement Confirmation: positive ETCO2 and breath sounds checked- equal and bilateral Tube secured with: Tape Dental Injury: Teeth and Oropharynx as per pre-operative assessment

## 2017-09-06 NOTE — Op Note (Signed)
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  PRE-OPERATIVE DIAGNOSIS:  LEFT BREAST DCIS  POST-OPERATIVE DIAGNOSIS:  LEFT BREAST DCIS  PROCEDURE:  Procedure(s): LEFT MASTECTOMY WITH DEEP LEFT AXILLARY SENTINEL LYMPH NODE BIOPSY AND  RIGHT PROPHYLACTIC MASTECTOMY (Bilateral)  SURGEON:  Surgeon(s) and Role:    * [name], MD - Primary  PHYSICIAN ASSISTANT:   ASSISTANTS: Sharyn Dross, RNFA   ANESTHESIA:   general  EBL:  20 mL   BLOOD ADMINISTERED:none  DRAINS: (2) Jackson-Pratt drain(s) with closed bulb suction in the prepectoral space   LOCAL MEDICATIONS USED:  NONE  SPECIMEN:  Source of Specimen:  right breast and left breast with axillary sentinel nodes X 2  DISPOSITION OF SPECIMEN:  PATHOLOGY  COUNTS:  YES  TOURNIQUET:  * No tourniquets in log *  DICTATION: .Dragon Dictation   After informed consent was obtained the patient was brought to the operating room and placed in the supine position on the operating table.  After adequate induction of general anesthesia the patient's bilateral chest, breast, and axillary areas were prepped with ChloraPrep, allowed to dry, and draped in usual sterile manner. An Appropriate timeout was performed.  Earlier in the day the patient underwent injection of 1 mCi of technetium sulfur colloid in the subareolar position on the left.  Attention was first turned to the right breast. An elliptical incision was made around the nipple and areola complex in order to minimize the excess skin. The incision was carried through the skin and subcutaneous tissue sharply with the plasma blade. Skin hooks were used to elevate the skin flaps anteriorly towards the ceiling. Thin skin flaps were created circumferentially using the plasma blade between the breast tissue in the subcutaneous fat. This dissection was carried all the way to the chest wall. The breast was then removed from the pectoralis muscle with the pectoralis  fascia. Once the specimen was removed it was oriented with a stitch on the lateral skin. One small firm lymph node was also removed with the specimen. The specimen was then sent to pathology for further evaluation. Hemostasis was achieved using the plasma blade. The wound was irrigated with copious amounts of saline. A small stab incision was made near the anterior axillary line inferior to the operative area. A tonsil clamp was placed through this incision and used to bring a 19 [place] round Blake drain into the operative bed. The drain was secured along the chest wall. The drain was anchored to the skin with a 3-0 nylon stitch. The superior and inferior skin flaps were then grossly reapproximated with interrupted 3-0 Vicryl stitches. Skin was then closed with a running 4-0 Monocryl subcuticular stitch. Attention was then turned to the left breast. A similar elliptical incision was made in the skin around the nipple and areola complex. The incision was carried through the skin and subcutaneous tissue sharply with the plasma blade. Skin was reestablished the skin flaps anteriorly towards the ceiling. Thin skin flaps were created with the plasma blade circumferentially between the breast tissue and the subcutaneous fat. This dissection was carried all the way to the chest wall. The breast was then removed from the pectoralis muscle with the pectoralis fascia. Once the specimen was removed it was oriented with a stitch on the lateral skin. The specimen was then sent to pathology for further evaluation. The neoprobe was then set to technetium and an area of radioactivity was readily identified in the left axilla. The neoprobe  was used to direct blunt hemostat dissection until a large lymph node was identified. There were actually 2 lymph nodes adjacent to each other that were removed sharply with the plasma blade. The lymphatics were controlled with clips. Ex vivo counts on sentinel node #1 were approximately 1000. Ex  vivo counts on sentinel node #2 where approximately 50. No other hot or palpable lymph nodes were identified in the left axilla. The wound was irrigated with Some amounts of saline. Hemostasis was achieved using the plasma blade. A small stab incision was made near the anterior axillary line inferior to the operative area. A tonsil clamp was placed through this incision and used to bring a 19 [place] round Blake drain into the operative bed. The drain was curled along the chest wall. The drain was anchored to the skin with a 3-0 nylon stitch. Next the superior and inferior skin flaps were grossly reapproximated with interrupted 3-0 Vicryl stitches. The skin was then closed with a running 4-0 Monocryl subcuticular stitch. The drains were placed to bulb suction and there was a good seal. Sterile dressings were applied. The patient tolerated the procedure well. At the end of the case all needle sponge and instrument counts were correct. The patient was then awakened and taken to recovery in stable condition.  PLAN OF CARE: Admit for overnight observation  PATIENT DISPOSITION:  PACU - hemodynamically stable.   Delay start of Pharmacological VTE agent (>24hrs) due to surgical blood loss or risk of bleeding: no

## 2017-09-06 NOTE — Anesthesia Procedure Notes (Signed)
Anesthesia Regional Block: Pectoralis block   Pre-Anesthetic Checklist: ,, timeout performed, Correct Patient, Correct Site, Correct Laterality, Correct Procedure,, site marked, risks and benefits discussed, Surgical consent,  Pre-op evaluation,  At surgeon's request and post-op pain management  Laterality: Left and Right  Prep: chloraprep       Needles:  Injection technique: Single-shot  Needle Type: Echogenic Stimulator Needle     Needle Length: 9cm  Needle Gauge: 21     Additional Needles:   Procedures:,,,, ultrasound used (permanent image in chart),,,,  Narrative:  Start time: 09/06/2017 9:20 AM End time: 09/06/2017 9:30 AM Injection made incrementally with aspirations every 5 mL.  Performed by: Personally  Anesthesiologist: Adele Barthel P  Additional Notes: Functioning IV was confirmed and monitors were applied.  A 42mm 21ga Arrow echogenic stimulator needle was used. Sterile prep, hand hygiene and sterile gloves were used.  Negative aspiration and negative test dose prior to incremental administration of local anesthetic. The patient tolerated the procedure well.

## 2017-09-06 NOTE — Progress Notes (Signed)
Pt. Refusing Tylenol preop.

## 2017-09-06 NOTE — Anesthesia Preprocedure Evaluation (Addendum)
Anesthesia Evaluation  Patient identified by MRN, date of birth, ID band Patient awake    Reviewed: Allergy & Precautions, NPO status , Patient's Chart, lab work & pertinent test results  Airway Mallampati: III  TM Distance: >3 FB Neck ROM: Full    Dental no notable dental hx.    Pulmonary asthma (exercise-induced) , former smoker,    Pulmonary exam normal breath sounds clear to auscultation       Cardiovascular negative cardio ROS Normal cardiovascular exam Rhythm:Regular Rate:Normal     Neuro/Psych Anxiety Depression negative neurological ROS     GI/Hepatic negative GI ROS, Neg liver ROS,   Endo/Other  Hypothyroidism   Renal/GU negative Renal ROS     Musculoskeletal negative musculoskeletal ROS (+)   Abdominal   Peds  Hematology negative hematology ROS (+)   Anesthesia Other Findings LEFT BREAST DCIS  Reproductive/Obstetrics                            Anesthesia Physical Anesthesia Plan  ASA: II  Anesthesia Plan: General and Regional   Post-op Pain Management: GA combined w/ Regional for post-op pain   Induction: Intravenous  PONV Risk Score and Plan:   Airway Management Planned: LMA  Additional Equipment:   Intra-op Plan:   Post-operative Plan: Extubation in OR  Informed Consent: I have reviewed the patients History and Physical, chart, labs and discussed the procedure including the risks, benefits and alternatives for the proposed anesthesia with the patient or authorized representative who has indicated his/her understanding and acceptance.   Dental advisory given  Plan Discussed with: CRNA  Anesthesia Plan Comments:        Anesthesia Quick Evaluation

## 2017-09-06 NOTE — Transfer of Care (Signed)
Immediate Anesthesia Transfer of Care Note  Patient: [name], MD  Procedure(s) Performed: LEFT MASTECTOMY WITH LEFT SENTINEL LYMPH NODE BIOPSY AND  RIGHT PROPHYLACTIC MASTECTOMY (Bilateral Breast)  Patient Location: PACU  Anesthesia Type:General  Level of Consciousness: awake and alert   Airway & Oxygen Therapy: Patient Spontanous Breathing and Patient connected to face mask oxygen  Post-op Assessment: Report given to RN and Post -op Vital signs reviewed and stable  Post vital signs: Reviewed and stable  Last Vitals:  Vitals:   09/06/17 0900 09/06/17 1222  BP:  (!) (P) 109/58  Pulse: (!) 50   Resp:    Temp:    SpO2:      Last Pain:  Vitals:   09/06/17 0802  TempSrc: Oral         Complications: No apparent anesthesia complications

## 2017-09-07 ENCOUNTER — Encounter (HOSPITAL_COMMUNITY): Payer: Self-pay | Admitting: General Surgery

## 2017-09-07 DIAGNOSIS — D0512 Intraductal carcinoma in situ of left breast: Secondary | ICD-10-CM | POA: Diagnosis not present

## 2017-09-07 NOTE — Discharge Summary (Signed)
Physician Discharge Summary  Patient ID: Whitney Hession, MD MRN: [id number] DOB/AGE: [birth year] 66 y.o.  Admit date: 09/06/2017 Discharge date: 09/07/2017  Admission Diagnoses:  Discharge Diagnoses:  Active Problems:   Ductal carcinoma in situ (DCIS) of left breast   Breast cancer in situ   Discharged Condition: good  Hospital Course: the pt underwent bilateral mastectomy and left sentinel node mapping. She tolerated surgery well. On pod 1 she was ready for discharge  Consults: None  Significant Diagnostic Studies: none  Treatments: surgery: as above  Discharge Exam: Blood pressure (!) 110/51, pulse 64, temperature 99.1 F (37.3 C), temperature source Oral, resp. rate 18, height 5\' 6"  (1.676 m), weight 59.9 kg (132 lb 0.9 oz), SpO2 98 %. General appearance: alert and cooperative Resp: clear to auscultation bilaterally Chest wall: skin flaps look good Cardio: regular rate and rhythm GI: soft, non-tender; bowel sounds normal; no masses,  no organomegaly  Disposition: Final discharge disposition not confirmed  Discharge Instructions    Call MD for:  difficulty breathing, headache or visual disturbances    Complete by:  As directed    Call MD for:  difficulty breathing, headache or visual disturbances    Complete by:  As directed    Call MD for:  extreme fatigue    Complete by:  As directed    Call MD for:  extreme fatigue    Complete by:  As directed    Call MD for:  hives    Complete by:  As directed    Call MD for:  hives    Complete by:  As directed    Call MD for:  persistant dizziness or light-headedness    Complete by:  As directed    Call MD for:  persistant dizziness or light-headedness    Complete by:  As directed    Call MD for:  persistant nausea and vomiting    Complete by:  As directed    Call MD for:  persistant nausea and vomiting    Complete by:  As directed    Call MD for:  redness, tenderness, or signs of infection (pain, swelling, redness,  odor or green/yellow discharge around incision site)    Complete by:  As directed    Call MD for:  redness, tenderness, or signs of infection (pain, swelling, redness, odor or green/yellow discharge around incision site)    Complete by:  As directed    Call MD for:  severe uncontrolled pain    Complete by:  As directed    Call MD for:  severe uncontrolled pain    Complete by:  As directed    Call MD for:  temperature >100.4    Complete by:  As directed    Call MD for:  temperature >100.4    Complete by:  As directed    Diet - low sodium heart healthy    Complete by:  As directed    Diet - low sodium heart healthy    Complete by:  As directed    Discharge instructions    Complete by:  As directed    Sponge bathe while drains are in. Empty drains, record output, and recharge bulb twice a day. No overhead activity   Discharge instructions    Complete by:  As directed    Sponge bathe while drains are in. No overhead activity. Empty drains, record output, recharge bulb twice a day   Increase activity slowly    Complete by:  As directed  Increase activity slowly    Complete by:  As directed    No wound care    Complete by:  As directed    No wound care    Complete by:  As directed      Allergies as of 09/07/2017      Reactions   Sulfonamide Derivatives    REACTION: rash      Medication List    TAKE these medications   HYDROcodone-acetaminophen 5-325 MG tablet Commonly known as:  NORCO/VICODIN Take 1-2 tablets by mouth every 6 (six) hours as needed for moderate pain or severe pain.   multivitamin with minerals tablet Take 1 tablet by mouth daily.   NATURE-THROID 65 MG tablet Generic drug:  thyroid Take 130 mg by mouth daily.   PROMETRIUM 200 MG capsule Generic drug:  progesterone Take 400 mg by mouth at bedtime. 200mg    traZODone 100 MG tablet Commonly known as:  DESYREL Take 1 tablet (100 mg total) by mouth at bedtime.      Follow-up Information    Autumn Messing III, MD Follow up in 1 week(s).   Specialty:  General Surgery Contact information: [address] [place] [address] [phone]           Signed: Merrie Roof 09/07/2017, 8:35 AM

## 2017-09-07 NOTE — Anesthesia Postprocedure Evaluation (Signed)
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, MD  Procedure(s) Performed: LEFT MASTECTOMY WITH LEFT SENTINEL LYMPH NODE BIOPSY AND  RIGHT PROPHYLACTIC MASTECTOMY (Bilateral Breast)     Patient location during evaluation: PACU Anesthesia Type: Regional and General Level of consciousness: awake and alert Pain management: pain level controlled Vital Signs Assessment: post-procedure vital signs reviewed and stable Respiratory status: spontaneous breathing, nonlabored ventilation, respiratory function stable and patient connected to nasal cannula oxygen Cardiovascular status: blood pressure returned to baseline and stable Postop Assessment: no apparent nausea or vomiting Anesthetic complications: no    Last Vitals:  Vitals:   09/07/17 0550 09/07/17 0934  BP: (!) 110/51 (!) 114/55  Pulse: 64 (!) 58  Resp: 18   Temp: 37.3 C   SpO2: 98% 99%    Last Pain:  Vitals:   09/07/17 0639  TempSrc:   PainSc: 1                  Ryan P Ellender

## 2017-09-07 NOTE — Progress Notes (Signed)
1 Day Post-Op   Subjective/Chief Complaint: No complaints. Feels good   Objective: Vital signs in last 24 hours: Temp:  [97 F (36.1 C)-99.1 F (37.3 C)] 99.1 F (37.3 C) (10/30 0550) Pulse Rate:  [50-82] 64 (10/30 0550) Resp:  [7-23] 18 (10/30 0550) BP: (105-123)/(44-91) 110/51 (10/30 0550) SpO2:  [95 %-100 %] 98 % (10/30 0550) Weight:  [59.9 kg (132 lb 0.9 oz)] 59.9 kg (132 lb 0.9 oz) (10/29 1537) Last BM Date: 09/05/17  Intake/Output from previous day: 10/29 0701 - 10/30 0700 In: 3601.7 [P.O.:1460; I.V.:2141.7] Out: 337 [Drains:317; Blood:20] Intake/Output this shift: No intake/output data recorded.  General appearance: alert and cooperative Resp: clear to auscultation bilaterally Chest wall: skin flaps look good Cardio: regular rate and rhythm GI: soft, non-tender; bowel sounds normal; no masses,  no organomegaly  Lab Results:  No results for input(s): WBC, HGB, HCT, PLT in the last 72 hours. BMET No results for input(s): NA, K, CL, CO2, GLUCOSE, BUN, CREATININE, CALCIUM in the last 72 hours. PT/INR No results for input(s): LABPROT, INR in the last 72 hours. ABG No results for input(s): PHART, HCO3 in the last 72 hours.  Invalid input(s): PCO2, PO2  Studies/Results: Nm Sentinel Node Inj-no Rpt (breast)  Result Date: 09/06/2017 Sulfur colloid was injected by the nuclear medicine technologist for melanoma sentinel node.    Anti-infectives: Anti-infectives    Start     Dose/Rate Route Frequency Ordered Stop   09/06/17 0718  ceFAZolin (ANCEF) IVPB 2g/100 mL premix  Status:  Discontinued     2 g 200 mL/hr over 30 Minutes Intravenous On call to O.R. 09/06/17 0718 09/06/17 1528   09/06/17 0716  ceFAZolin (ANCEF) IVPB 2g/100 mL premix     2 g 200 mL/hr over 30 Minutes Intravenous On call to O.R. 09/06/17 0716 09/06/17 1019      Assessment/Plan: s/p Procedure(s): LEFT MASTECTOMY WITH LEFT SENTINEL LYMPH NODE BIOPSY AND  RIGHT PROPHYLACTIC MASTECTOMY  (Bilateral) Advance diet Discharge  LOS: 0 days    [name],Jaisen Wiltrout S 09/07/2017

## 2017-09-07 NOTE — Progress Notes (Signed)
Discharge instructions and RX given to patient. Transport home by family.   Hav, RN

## 2017-09-11 LAB — AEROBIC/ANAEROBIC CULTURE W GRAM STAIN (SURGICAL/DEEP WOUND)

## 2017-09-11 LAB — AEROBIC/ANAEROBIC CULTURE (SURGICAL/DEEP WOUND): CULTURE: NO GROWTH

## 2017-09-13 ENCOUNTER — Ambulatory Visit (HOSPITAL_BASED_OUTPATIENT_CLINIC_OR_DEPARTMENT_OTHER): Payer: PPO | Admitting: Hematology and Oncology

## 2017-09-13 DIAGNOSIS — Z170 Estrogen receptor positive status [ER+]: Secondary | ICD-10-CM

## 2017-09-13 DIAGNOSIS — D0512 Intraductal carcinoma in situ of left breast: Secondary | ICD-10-CM

## 2017-09-13 NOTE — Progress Notes (Signed)
Patient Care Team: [name], MD as PCP - General (Family Medicine)  DIAGNOSIS:  Encounter Diagnosis  Name Primary?  . Ductal carcinoma in situ (DCIS) of left breast     SUMMARY OF ONCOLOGIC HISTORY:   Ductal carcinoma in situ (DCIS) of left breast   08/10/2017 Initial Diagnosis    Left breast biopsy: Subareolar DCIS with calcifications, upper-outer quadrant DCIS with calcifications, ER 100%, PR 60%, high-grade, Tis Nx stage 0       09/06/2017 Surgery    Left mastectomy: High-grade DCIS with necrosis and calcifications spanning 4.5 cm, margins negative 0/3 lymph nodes negative, ER 100%, PR 60%, Tis N0 stage 0 right mastectomy prophylactic: No malignancy identified       CHIEF COMPLIANT: Follow-up after recent bilateral mastectomies for DCIS  INTERVAL HISTORY: [name], 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  She is here today to discuss her pathology report.  She is healing from the recent surgeries and has drains in place in the left side.  REVIEW OF SYSTEMS:   Constitutional: Denies fevers, chills or abnormal weight loss Eyes: Denies blurriness of vision Ears, nose, mouth, throat, and face: Denies mucositis or sore throat Respiratory: Denies cough, dyspnea or wheezes Cardiovascular: Denies palpitation, chest discomfort Gastrointestinal:  Denies nausea, heartburn or change in bowel habits Skin: Denies abnormal skin rashes Lymphatics: Denies new lymphadenopathy or easy bruising Neurological:Denies numbness, tingling or new weaknesses Behavioral/Psych: Mood is stable, no new changes  Extremities: No lower extremity edema Breast: Bilateral mastectomies All other systems were reviewed with the patient and are negative.  I have reviewed the past medical history, past surgical history, social history and family history with the patient and they are unchanged from previous note.  ALLERGIES:   is allergic to sulfonamide derivatives.  MEDICATIONS:  Current Outpatient Medications  Medication Sig Dispense Refill  . HYDROcodone-acetaminophen (NORCO/VICODIN) 5-325 MG tablet Take 1-2 tablets by mouth every 6 (six) hours as needed for moderate pain or severe pain. 15 tablet 0  . Multiple Vitamins-Minerals (MULTIVITAMIN WITH MINERALS) tablet Take 1 tablet by mouth daily.    Marland Kitchen NATURE-THROID 65 MG tablet Take 130 mg by mouth daily.  0  . progesterone (PROMETRIUM) 200 MG capsule Take 400 mg by mouth at bedtime. 200mg     . traZODone (DESYREL) 100 MG tablet Take 1 tablet (100 mg total) by mouth at bedtime.     No current facility-administered medications for this visit.     PHYSICAL EXAMINATION: ECOG PERFORMANCE STATUS: 1 - Symptomatic but completely ambulatory  Vitals:   09/13/17 1009  BP: 100/65  Pulse: (!) 59  Resp: 18  Temp: 98.1 F (36.7 C)  SpO2: 99%   Filed Weights   09/13/17 1009  Weight: 118 lb (53.5 kg)    GENERAL:alert, no distress and comfortable SKIN: skin color, texture, turgor are normal, no rashes or significant lesions EYES: normal, Conjunctiva are pink and non-injected, sclera clear OROPHARYNX:no exudate, no erythema and lips, buccal mucosa, and tongue normal  NECK: supple, thyroid normal size, non-tender, without nodularity LYMPH:  no palpable lymphadenopathy in the cervical, axillary or inguinal LUNGS: clear to auscultation and percussion with normal breathing effort HEART: regular rate & rhythm and no murmurs and no lower extremity edema ABDOMEN:abdomen soft, non-tender and normal bowel sounds MUSCULOSKELETAL:no cyanosis of digits and no clubbing  NEURO: alert & oriented x 3 with fluent speech, no focal motor/sensory deficits EXTREMITIES: No lower extremity edema  LABORATORY DATA:  I have reviewed the data as listed   Chemistry      Component Value Date/Time   NA 140 09/02/2017 1024   K 3.8 09/02/2017 1024   CL 107 09/02/2017 1024   CO2 23  09/02/2017 1024   BUN 17 09/02/2017 1024   CREATININE 0.83 09/02/2017 1024      Component Value Date/Time   CALCIUM 9.6 09/02/2017 1024       Lab Results  Component Value Date   WBC 6.6 09/02/2017   HGB 12.3 09/02/2017   HCT 38.5 09/02/2017   MCV 91.4 09/02/2017   PLT 250 09/02/2017    ASSESSMENT & PLAN:  Ductal carcinoma in situ (DCIS) of left breast 09/07/2017: Left mastectomy: High-grade DCIS with necrosis and calcifications spanning 4.5 cm, margins negative 0/3 lymph nodes negative, ER 100%, PR 60%, Tis N0 stage 0 right mastectomy prophylactic: No malignancy identified  Pathology counseling: I discussed the final pathology report of the patient provided  a copy of this report. I discussed the margins as well as lymph node surgeries. We also discussed the final staging along with previously performed ER/PR testing.  Recommendation: Since she had bilateral mastectomies there is no role of antiestrogen therapy.  There is also no role of adjuvant radiation.  She does not need to follow [place] from oncology standpoint. She can be seen on an as-needed basis  I spent 25 minutes talking to the patient of which more than half was spent in counseling and coordination of care.  No orders of the defined types were placed in this encounter.  The patient has a good understanding of the overall plan. she agrees with it. she will call with any problems that may develop before the next visit here.   Rulon Eisenmenger, MD 09/13/17

## 2017-09-13 NOTE — Assessment & Plan Note (Signed)
09/07/2017: Left mastectomy: High-grade DCIS with necrosis and calcifications spanning 4.5 cm, margins negative 0/3 lymph nodes negative, ER 100%, PR 60%, Tis N0 stage 0 right mastectomy prophylactic: No malignancy identified  Pathology counseling: I discussed the final pathology report of the patient provided  a copy of this report. I discussed the margins as well as lymph node surgeries. We also discussed the final staging along with previously performed ER/PR testing.  Recommendation: Since she had bilateral mastectomies there is no role of antiestrogen therapy.  There is also no role of adjuvant radiation.  Return to clinic in 6 months for survivorship care plan visit.  After that, she can be followed once a year with survivorship clinic.

## 2017-09-14 ENCOUNTER — Encounter: Payer: Self-pay | Admitting: *Deleted

## 2017-09-16 DIAGNOSIS — J329 Chronic sinusitis, unspecified: Secondary | ICD-10-CM | POA: Diagnosis not present

## 2017-09-16 DIAGNOSIS — R5381 Other malaise: Secondary | ICD-10-CM | POA: Diagnosis not present

## 2017-09-16 DIAGNOSIS — Z77120 Contact with and (suspected) exposure to mold (toxic): Secondary | ICD-10-CM | POA: Diagnosis not present

## 2017-09-17 ENCOUNTER — Telehealth: Payer: Self-pay | Admitting: Hematology and Oncology

## 2017-09-17 DIAGNOSIS — Z5181 Encounter for therapeutic drug level monitoring: Secondary | ICD-10-CM | POA: Diagnosis not present

## 2017-09-17 DIAGNOSIS — D0512 Intraductal carcinoma in situ of left breast: Secondary | ICD-10-CM | POA: Diagnosis not present

## 2017-09-17 DIAGNOSIS — E876 Hypokalemia: Secondary | ICD-10-CM | POA: Diagnosis not present

## 2017-09-17 DIAGNOSIS — R978 Other abnormal tumor markers: Secondary | ICD-10-CM | POA: Diagnosis not present

## 2017-09-17 NOTE — Telephone Encounter (Signed)
No 11/5 los.  °

## 2017-09-21 DIAGNOSIS — E876 Hypokalemia: Secondary | ICD-10-CM | POA: Diagnosis not present

## 2017-10-05 LAB — FUNGUS CULTURE RESULT

## 2017-10-05 LAB — FUNGUS CULTURE WITH STAIN

## 2017-10-05 LAB — FUNGAL ORGANISM REFLEX

## 2017-10-08 ENCOUNTER — Other Ambulatory Visit: Payer: Self-pay

## 2017-10-08 ENCOUNTER — Ambulatory Visit: Payer: PPO | Attending: General Surgery | Admitting: Physical Therapy

## 2017-10-08 DIAGNOSIS — Z483 Aftercare following surgery for neoplasm: Secondary | ICD-10-CM

## 2017-10-08 DIAGNOSIS — M6281 Muscle weakness (generalized): Secondary | ICD-10-CM | POA: Diagnosis not present

## 2017-10-08 NOTE — Therapy (Signed)
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 Marlou Starks   Encounter Date: 10/08/2017  PT End of Session - 10/08/17 1224    Visit Number  1    Number of Visits  9    Date for PT Re-Evaluation  11/08/17    PT Start Time  1110    PT Stop Time  1155    PT Time Calculation (min)  45 min    Activity Tolerance  Patient tolerated treatment well    Behavior During Therapy  [facility] for tasks assessed/performed       Past Medical History:  Diagnosis Date  . Anxiety   . Arthritis   . Asthma    excerise induced  . Cancer [facility])    breast cancer  . Depression   . Hypothyroidism     Past Surgical History:  Procedure Laterality Date  . APPENDECTOMY  1983  . CHOLECYSTECTOMY  1975  . MASTECTOMY W/ SENTINEL NODE BIOPSY Bilateral 09/06/2017   Procedure: LEFT MASTECTOMY WITH LEFT SENTINEL LYMPH NODE BIOPSY AND  RIGHT PROPHYLACTIC MASTECTOMY;  Surgeon: [name], MD;  Location: [place];  Service: General;  Laterality: Bilateral;  . TONSILLECTOMY AND ADENOIDECTOMY      There were no vitals filed for this visit.   Subjective Assessment - 10/08/17 1121    Subjective  some heaviness in left arm and pain in left shoulder and into left chest     Pertinent History  Pt with bilateral mastectomy on 09/06/2017 with 2 nodes removed on left side due to DCIS  She will not have to have chemo or radiation or hormone treatment , mild shoulder issues with left shoulder, but has history of shoulder issues on her right shoulder,     Patient Stated Goals  to monitor exercise and progress     Currently in Pain?  Yes    Pain Score  1     Pain Location  Axilla    Pain Orientation  Left    Pain Descriptors / Indicators  Discomfort    Pain Type  Surgical pain    Pain  Radiating Towards  to chest at times     Pain Onset  1 to 4 weeks ago    Aggravating Factors   increases with movement     Pain Relieving Factors  rest          [facility] PT Assessment - 10/08/17 0001      Assessment   Medical Diagnosis  left breast cancer     Referring Provider  Dr. Marlou Starks    Onset Date/Surgical Date  09/06/17    Hand Dominance  Right      Precautions   Precaution Comments  none      Restrictions   Weight Bearing Restrictions  No      Balance Screen   Has the patient fallen in the past 6 months  No    Has the patient had a decrease in activity level because of a fear of falling?   No    Is the patient reluctant to leave their home because of a fear of falling?   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     Leisure  has not been able to exercise since she has been sick but used to be a runner and wants to get back to exercise       Cognition   Overall Cognitive Status  Within Functional Limits for tasks assessed      Observation/Other Assessments   Observations  pt is healing from bilateral mastectomy with healing incisions with surgical glue on both sides of chest.     Skin Integrity  she has small scaly rash on left anterior chest and posterio neck that is itchy She thinks it may be from the sun (?) as she has been on a recent [place] cruise     Quick DASH   45.45      Sensation   Light Touch  Not tested      Coordination   Gross Motor Movements are Fluid and Coordinated  Yes      Posture/Postural Control   Posture/Postural Control  Postural limitations    Postural Limitations  Rounded Shoulders;Forward head    Posture Comments  pt very thin       AROM    Right Shoulder Flexion  130 Degrees    Right Shoulder ABduction  125 Degrees    Left Shoulder Flexion  140 Degrees    Left Shoulder ABduction  110 Degrees      PROM   Right Shoulder Flexion  155 Degrees    Left Shoulder Flexion  160 Degrees pulling in left pec area       Strength   Overall Strength  Deficits    Overall Strength Comments  good scapular symmetry but muscle atrophy evident     Strength Assessment Site  Shoulder    Right/Left Shoulder  Right;Left    Right Shoulder Flexion  3-/5    Right Shoulder ABduction  3-/5    Left Shoulder Flexion  3-/5    Left Shoulder ABduction  3-/5        LYMPHEDEMA/ONCOLOGY QUESTIONNAIRE - 10/08/17 1221      Type   Cancer Type  left breast       Surgeries   Mastectomy Date  09/06/17 bilateral     Sentinel Lymph Node Biopsy Date  09/06/17 left     Number Lymph Nodes Removed  2      Treatment   Active Chemotherapy Treatment  No    Past Chemotherapy Treatment  No    Active Radiation Treatment  No    Past Radiation Treatment  No    Current Hormone Treatment  No    Past Hormone Therapy  No      What other symptoms do you have   Are you Having Heaviness or Tightness  Yes    Are you having Pain  Yes    Are you having pitting edema  No    Is it Hard or Difficult finding clothes that fit  No    Do you have infections  No    Is there Decreased scar mobility  No    Stemmer Sign  No        Quick Dash - 10/08/17 0001    Open a tight or new jar  Moderate difficulty    Do heavy  household chores (wash walls, wash floors)  Severe difficulty    Carry a shopping bag or briefcase  Mild difficulty    Wash your back  Moderate difficulty    Use a knife to cut food  Moderate difficulty    Recreational activities in which you take some force or impact through your arm, shoulder, or hand (golf, hammering, tennis)  Unable    During the past week, to what extent has your arm, shoulder or hand problem interfered with your normal social activities  with family, friends, neighbors, or groups?  Modererately    During the past week, to what extent has your arm, shoulder or hand problem limited your work or other regular daily activities  Modererately    Arm, shoulder, or hand pain.  Mild    Tingling (pins and needles) in your arm, shoulder, or hand  None    Difficulty Sleeping  Mild difficulty    DASH Score  45.45 %       Objective measurements completed on examination: See above findings.                      [facility] Goals - 10/08/17 1242      CC Long Term Goal  #1   Title  Pt will be verbalize lymphedema risk reduction practices     Time  4    Period  Weeks    Status  New      CC Long Term Goal  #2   Title  Pt will be independent in a basic home exercise program for shoulder range of motion and strength     Time  4    Period  Weeks    Status  New      CC Long Term Goal  #3   Title  Pt will have 160 degrees of bilateral active shoulder flexion so that she can perform activies of daily living and household chores with greater ease     Time  4    Period  Weeks    Status  New      CC Long Term Goal  #4   Title  Pt will decrease Quick DASH score to < 35 indicating an improvment in functional use of left arm     Time  4    Period  Weeks    Status  New          Plan - 10/08/17 1225    Clinical Impression Statement  66 yo female 4 week post bilateral mastectomy with 2 nodes removed from left side. She presents with tightness in both shoulders with pain in left shoulder and axilla radiating to chest  She wants to increase her general muscle strength as she has lost weight and muscle mass during her illness     History and Personal Factors relevant to plan of care:  undergoing treatment for mold toxicity, lives alone, integrative MD     Clinical Presentation  Stable    Clinical Presentation due to:  no futher treatment planned     Clinical Decision Making  Low    Rehab Potential  Excellent     Clinical Impairments Affecting Rehab Potential  none    PT Frequency  2x / week    PT Duration  4 weeks    PT Treatment/Interventions  ADLs/Self Care Home Management;Therapeutic activities;Therapeutic exercise;Patient/family education;Passive range of motion;Scar mobilization;Manual lymph drainage;Manual techniques    PT Next Visit Plan  Begin  with pulleys, wall stretches.  Instruct in lymphedema risk reduction practices ( sign up for ABC Class or issue weblink) Perform manual stretches and myofascial work Gradually begin with strengthening for core and extremities. Later teach Strength ABC class and explore community exercise options     Recommended Other Services  ABC     Consulted and Agree with Plan of Care  Patient       Patient will benefit from skilled therapeutic intervention in order to improve the following deficits and impairments:  Decreased knowledge of precautions, Pain  Visit Diagnosis: Aftercare following surgery for neoplasm - Plan: PT plan of care cert/re-cert  Muscle weakness (generalized) - Plan: PT plan of care cert/re-cert  G-Codes - 16/10/96 1248    Functional Assessment Tool Used (Outpatient Only)  Quick DASH     Functional Limitation  Carrying, moving and handling objects    Carrying, Moving and Handling Objects Current Status (E4540)  At least 40 percent but less than 60 percent impaired, limited or restricted    Carrying, Moving and Handling Objects Goal Status (J8119)  At least 20 percent but less than 40 percent impaired, limited or restricted        Problem List Patient Active Problem List   Diagnosis Date Noted  . Breast cancer in situ 09/06/2017  . 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 Low back pain 12/02/2016  . Left hamstring injury 11/01/2013  . Nonallopathic lesion of cervical region 11/01/2013  . Nonallopathic lesion of lumbosacral region 11/01/2013  . Nonallopathic lesion of thoracic region 11/01/2013   Donato Heinz. Owens Shark  PT  Norwood Levo 10/08/2017, 12:51 PM  [place], [place], [address] Phone: [phone]   Fax:  [phone]  Name: Whitney Erichsen, MD MRN: [id number] Date of Birth: October 24, 1951

## 2017-10-13 DIAGNOSIS — D0512 Intraductal carcinoma in situ of left breast: Secondary | ICD-10-CM | POA: Diagnosis not present

## 2017-10-13 DIAGNOSIS — R5383 Other fatigue: Secondary | ICD-10-CM | POA: Diagnosis not present

## 2017-10-13 DIAGNOSIS — B3782 Candidal enteritis: Secondary | ICD-10-CM | POA: Diagnosis not present

## 2017-10-13 DIAGNOSIS — G9349 Other encephalopathy: Secondary | ICD-10-CM | POA: Diagnosis not present

## 2017-10-13 DIAGNOSIS — B957 Other staphylococcus as the cause of diseases classified elsewhere: Secondary | ICD-10-CM | POA: Diagnosis not present

## 2017-10-13 DIAGNOSIS — G4701 Insomnia due to medical condition: Secondary | ICD-10-CM | POA: Diagnosis not present

## 2017-10-13 DIAGNOSIS — E639 Nutritional deficiency, unspecified: Secondary | ICD-10-CM | POA: Diagnosis not present

## 2017-10-13 DIAGNOSIS — E279 Disorder of adrenal gland, unspecified: Secondary | ICD-10-CM | POA: Diagnosis not present

## 2017-10-13 DIAGNOSIS — M2550 Pain in unspecified joint: Secondary | ICD-10-CM | POA: Diagnosis not present

## 2017-10-14 ENCOUNTER — Ambulatory Visit: Payer: PPO | Admitting: Physical Therapy

## 2017-10-14 ENCOUNTER — Other Ambulatory Visit: Payer: Self-pay | Admitting: General Surgery

## 2017-10-14 DIAGNOSIS — L308 Other specified dermatitis: Secondary | ICD-10-CM | POA: Diagnosis not present

## 2017-10-15 DIAGNOSIS — Z23 Encounter for immunization: Secondary | ICD-10-CM | POA: Diagnosis not present

## 2017-10-15 DIAGNOSIS — L309 Dermatitis, unspecified: Secondary | ICD-10-CM | POA: Diagnosis not present

## 2017-10-15 DIAGNOSIS — L404 Guttate psoriasis: Secondary | ICD-10-CM | POA: Diagnosis not present

## 2017-10-22 ENCOUNTER — Ambulatory Visit: Payer: PPO | Attending: General Surgery | Admitting: Physical Therapy

## 2017-10-22 DIAGNOSIS — R5383 Other fatigue: Secondary | ICD-10-CM | POA: Diagnosis not present

## 2017-10-22 DIAGNOSIS — D0512 Intraductal carcinoma in situ of left breast: Secondary | ICD-10-CM | POA: Diagnosis not present

## 2017-10-22 DIAGNOSIS — Z483 Aftercare following surgery for neoplasm: Secondary | ICD-10-CM

## 2017-10-22 DIAGNOSIS — G4701 Insomnia due to medical condition: Secondary | ICD-10-CM | POA: Diagnosis not present

## 2017-10-22 DIAGNOSIS — M6281 Muscle weakness (generalized): Secondary | ICD-10-CM | POA: Insufficient documentation

## 2017-10-22 DIAGNOSIS — G9349 Other encephalopathy: Secondary | ICD-10-CM | POA: Diagnosis not present

## 2017-10-22 NOTE — Therapy (Signed)
[place], [place], [address] Phone: [phone]   Fax:  [phone]  Physical Therapy Treatment  Patient Details  Name: Whitney Roache, Whitney [name] MRN: [id number] Date of Birth: 1951-07-03 Referring Provider: Dr. Marlou Starks   Encounter Date: 10/22/2017  PT End of Session - 10/22/17 1233    Visit Number  2    Number of Visits  9    Date for PT Re-Evaluation  11/08/17    PT Start Time  0936    PT Stop Time  1024    PT Time Calculation (min)  48 min    Activity Tolerance  Patient tolerated treatment well    Behavior During Therapy  [facility] for tasks assessed/performed       Past Medical History:  Diagnosis Date  . Anxiety   . Arthritis   . Asthma    excerise induced  . Cancer [facility])    breast cancer  . Depression   . Hypothyroidism     Past Surgical History:  Procedure Laterality Date  . APPENDECTOMY  1983  . CHOLECYSTECTOMY  1975  . MASTECTOMY W/ SENTINEL NODE BIOPSY Bilateral 09/06/2017   Procedure: LEFT MASTECTOMY WITH LEFT SENTINEL LYMPH NODE BIOPSY AND  RIGHT PROPHYLACTIC MASTECTOMY;  Surgeon: [name], Whitney [name];  Location: [place];  Service: General;  Laterality: Bilateral;  . TONSILLECTOMY AND ADENOIDECTOMY      There were no vitals filed for this visit.  Subjective Assessment - 10/22/17 0938    Subjective  I don't have lymphedema.  Last time I was here I had a rash.  I have seen a dermatologist and had that looked at and it is getting better with steroid creams. After shoveling snow the other day, I have more ROM and strength than I thought I had.  Nothing bothered me the next day.    Pertinent History  Pt with bilateral mastectomy on 09/06/2017 with 2 nodes removed on left side due to DCIS  She will not have to have chemo or radiation or hormone treatment , mild shoulder issues with left shoulder, but has history of shoulder issues on her right shoulder,     Currently in Pain?  Yes    Pain Score   6     Pain Location  Abdomen    Pain Orientation  Left;Upper    Aggravating Factors   touching it    Pain Relieving Factors  not touching or stretching it         [facility] PT Assessment - 10/22/17 0001      Strength   Overall Strength Comments  both shoulders and elbows 5/5 today                  [facility] Adult PT Treatment/Exercise - 10/22/17 0001      Self-Care   Self-Care  Other Self-Care Comments    Other Self-Care Comments   Informed about ABC class; also quickly instructed in strength ABC program      Manual Therapy   Manual Therapy  Soft tissue mobilization;Myofascial release;Scapular mobilization    Soft tissue mobilization  to area around mastectomy incisions, mainly only left (right is more mobile)    Myofascial Release  In supine: crosshands to left chest in vertical and diagonal from left shoulder to middle abdomen; left shoulder in abduction and other hand at left flank; in right sidelying, left arm in abduction and release in opposite direction at left flank    Scapular Mobilization  in right sidelying to left scapula             PT Education - 10/22/17 1233    Education provided  Yes    Education Details  about ABC class, about strength ABC program    Person(s) Educated  Patient    Methods  Explanation;Handout    Comprehension  Verbalized understanding             [facility] Goals - 10/08/17 1242      CC Long Term Goal  #1   Title  Pt will be verbalize lymphedema risk reduction practices     Time  4    Period  Weeks    Status  New      CC Long Term Goal  #2   Title  Pt will be independent in a basic home exercise program for shoulder range of motion and strength     Time  4    Period  Weeks    Status  New      CC Long Term Goal  #3   Title  Pt will have 160 degrees of bilateral active shoulder flexion so that she can perform activies of daily living and household chores with greater ease     Time  4    Period  Weeks     Status  New      CC Long Term Goal  #4   Title  Pt will decrease Quick DASH score to < 35 indicating an improvment in functional use of left arm     Time  4    Period  Weeks    Status  New         Plan - 10/22/17 1234    Clinical Impression Statement  Pt. found she is stronger than she thought with the snowstorm we had this week, when she was able to shovel.  Her UEs grossly muscle test at 5/5 and she was given instruction in gradual progressive strengthening program today. She has exercised in the past so understood this well.  She reported benefit of manual therapy today.    Rehab Potential  Excellent    Clinical Impairments Affecting Rehab Potential  none    PT Frequency  2x / week    PT Duration  4 weeks    PT Treatment/Interventions  ADLs/Self Care Home Management;Therapeutic activities;Therapeutic exercise;Patient/family education;Passive range of motion;Scar mobilization;Manual lymph drainage;Manual techniques    PT Next Visit Plan  Continue manual work (PROM, myofascial release); check in with her about her home exercises; see if she came to [facility] class.    PT Home Exercise Plan  strength ABC program    Consulted and Agree with Plan of Care  Patient       Patient will benefit from skilled therapeutic intervention in order to improve the following deficits and impairments:  Decreased knowledge of precautions, Pain  Visit Diagnosis: Aftercare following surgery for neoplasm  Muscle weakness (generalized)     Problem List Patient Active Problem List   Diagnosis Date Noted  . Breast cancer in situ 09/06/2017  . 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 Low back pain 12/02/2016  . Left hamstring injury 11/01/2013  . Nonallopathic lesion of cervical region 11/01/2013  . Nonallopathic lesion of lumbosacral region 11/01/2013  . Nonallopathic lesion of thoracic region 11/01/2013    Whitney [name] 10/22/2017, 12:37 PM  [place], [place],  [address] Phone: [phone]   Fax:  [phone]  Name: Whitney Sails, Whitney [name] MRN: [id number] Date of Birth: [birth year]  Whitney [name], PT 10/22/17 12:37 PM

## 2017-10-26 ENCOUNTER — Ambulatory Visit: Payer: PPO | Admitting: Family Medicine

## 2017-10-26 ENCOUNTER — Ambulatory Visit: Payer: PPO | Admitting: Physical Therapy

## 2017-10-26 DIAGNOSIS — Z483 Aftercare following surgery for neoplasm: Secondary | ICD-10-CM

## 2017-10-26 DIAGNOSIS — M6281 Muscle weakness (generalized): Secondary | ICD-10-CM

## 2017-10-26 NOTE — Therapy (Signed)
[place], [place], [address] Phone: [phone]   Fax:  [phone]  Physical Therapy Treatment  Patient Details  Name: Whitney Dohn, MD MRN: [id number] Date of Birth: 04/20/1951 Referring Provider: Dr. Marlou Starks   Encounter Date: 10/26/2017  PT End of Session - 10/26/17 1733    Visit Number  3    Number of Visits  9    Date for PT Re-Evaluation  11/08/17    PT Start Time  4268    PT Stop Time  1610    PT Time Calculation (min)  46 min    Activity Tolerance  Patient tolerated treatment well    Behavior During Therapy  [facility] for tasks assessed/performed       Past Medical History:  Diagnosis Date  . Anxiety   . Arthritis   . Asthma    excerise induced  . Cancer [facility])    breast cancer  . Depression   . Hypothyroidism     Past Surgical History:  Procedure Laterality Date  . APPENDECTOMY  1983  . CHOLECYSTECTOMY  1975  . MASTECTOMY W/ SENTINEL NODE BIOPSY Bilateral 09/06/2017   Procedure: LEFT MASTECTOMY WITH LEFT SENTINEL LYMPH NODE BIOPSY AND  RIGHT PROPHYLACTIC MASTECTOMY;  Surgeon: [name], MD;  Location: [place];  Service: General;  Laterality: Bilateral;  . TONSILLECTOMY AND ADENOIDECTOMY      There were no vitals filed for this visit.  Subjective Assessment - 10/26/17 1525    Subjective  "I did my strength training one time.  I used 2 lb. weights." "I felt wonderful after the last visit. I had an emotional release." No soreness with it. Forgot to come to ABC class.    Pertinent History  Pt with bilateral mastectomy on 09/06/2017 with 2 nodes removed on left side due to DCIS  She will not have to have chemo or radiation or hormone treatment , mild shoulder issues with left shoulder, but has history of shoulder issues on her right shoulder,     Currently in Pain?  No/denies                      [facility] Adult PT Treatment/Exercise - 10/26/17 0001      Manual  Therapy   Manual Therapy  Passive ROM    Soft tissue mobilization  to area around mastectomy incisions, mainly on left (right is more mobile)    Myofascial Release  In supine: crosshands to left chest in vertical and diagonal from left shoulder to middle abdomen; also to upper and lower chest in horizontal; left UEmyofascial pulling with movement into abduction; O-A release and concurrent cervical traction with sternal distraction    Passive ROM  supine over towel roll, prolonged bilateral pect stretches; left shoulder abduction stretch             PT Education - 10/26/17 1736    Education provided  Yes    Education Details  gave ABC class handout    Person(s) Educated  Patient    Methods  Handout    Comprehension  Verbalized understanding             [place] - 10/08/17 1242      CC Long Term Goal  #1   Title  Pt will be verbalize lymphedema risk reduction practices     Time  4    Period  Weeks    Status  New  CC Long Term Goal  #2   Title  Pt will be independent in a basic home exercise program for shoulder range of motion and strength     Time  4    Period  Weeks    Status  New      CC Long Term Goal  #3   Title  Pt will have 160 degrees of bilateral active shoulder flexion so that she can perform activies of daily living and household chores with greater ease     Time  4    Period  Weeks    Status  New      CC Long Term Goal  #4   Title  Pt will decrease Quick DASH score to < 35 indicating an improvment in functional use of left arm     Time  4    Period  Weeks    Status  New         Plan - 10/26/17 1733    Clinical Impression Statement  Pt. reports feeling better at end of session, and had benefit from previous session, so wants to continue myofascial release.  Although her left shoulder AROM is good, she does have some tightness evident with passive abduction keeping arm in that plane.    Rehab Potential  Excellent    Clinical  Impairments Affecting Rehab Potential  none    PT Frequency  2x / week    PT Duration  4 weeks    PT Treatment/Interventions  ADLs/Self Care Home Management;Therapeutic activities;Therapeutic exercise;Patient/family education;Passive range of motion;Scar mobilization;Manual lymph drainage;Manual techniques    PT Next Visit Plan  Continue manual work (PROM, myofascial release)    PT Home Exercise Plan  strength ABC program    Consulted and Agree with Plan of Care  Patient       Patient will benefit from skilled therapeutic intervention in order to improve the following deficits and impairments:  Decreased knowledge of precautions, Pain  Visit Diagnosis: Aftercare following surgery for neoplasm  Muscle weakness (generalized)     Problem List Patient Active Problem List   Diagnosis Date Noted  . Breast cancer in situ 09/06/2017  . 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 Low back pain 12/02/2016  . Left hamstring injury 11/01/2013  . Nonallopathic lesion of cervical region 11/01/2013  . Nonallopathic lesion of lumbosacral region 11/01/2013  . Nonallopathic lesion of thoracic region 11/01/2013    [facility] 10/26/2017, 5:38 PM  [place], [place], [address] Phone: [phone]   Fax:  [phone]  Name: Charese Abundis, MD MRN: [id number] Date of Birth: October 02, 1951  Serafina Royals, PT 10/26/17 5:38 PM

## 2017-10-28 ENCOUNTER — Ambulatory Visit: Payer: PPO | Admitting: Physical Therapy

## 2017-10-28 ENCOUNTER — Encounter: Payer: Self-pay | Admitting: Physical Therapy

## 2017-10-28 DIAGNOSIS — Z483 Aftercare following surgery for neoplasm: Secondary | ICD-10-CM

## 2017-10-28 DIAGNOSIS — M6281 Muscle weakness (generalized): Secondary | ICD-10-CM

## 2017-10-28 NOTE — Therapy (Signed)
[place], [place], [address] Phone: [phone]   Fax:  [phone]  Physical Therapy Evaluation  Patient Details  Name: Whitney Szczygiel, MD MRN: [id number] Date of Birth: 10/18/1951 Referring Provider: Dr. Marlou Starks   Encounter Date: 10/28/2017  PT End of Session - 10/28/17 1615    Visit Number  4    Number of Visits  9    Date for PT Re-Evaluation  11/08/17    PT Start Time  0626    PT Stop Time  1608    PT Time Calculation (min)  53 min    Activity Tolerance  Patient tolerated treatment well    Behavior During Therapy  [facility] for tasks assessed/performed       Past Medical History:  Diagnosis Date  . Anxiety   . Arthritis   . Asthma    excerise induced  . Cancer [facility])    breast cancer  . Depression   . Hypothyroidism     Past Surgical History:  Procedure Laterality Date  . APPENDECTOMY  1983  . CHOLECYSTECTOMY  1975  . MASTECTOMY W/ SENTINEL NODE BIOPSY Bilateral 09/06/2017   Procedure: LEFT MASTECTOMY WITH LEFT SENTINEL LYMPH NODE BIOPSY AND  RIGHT PROPHYLACTIC MASTECTOMY;  Surgeon: [name], MD;  Location: [place];  Service: General;  Laterality: Bilateral;  . TONSILLECTOMY AND ADENOIDECTOMY      There were no vitals filed for this visit.   Subjective Assessment - 10/28/17 1517    Subjective  My shoulder feels pretty good. I did the stretching out exercises. It is still tight but the front of my axilla feels pretty good now. I think I am going to bump up to 3 lb weights.     Pertinent History  Pt with bilateral mastectomy on 09/06/2017 with 2 nodes removed on left side due to DCIS  She will not have to have chemo or radiation or hormone treatment , mild shoulder issues with left shoulder, but has history of shoulder issues on her right shoulder,     Patient Stated Goals  to monitor exercise and progress     Currently in Pain?  No/denies    Pain Score  0-No pain                  Objective measurements completed on examination: See above findings.      [facility] Adult PT Treatment/Exercise - 10/28/17 0001      Shoulder Exercises: Supine   Horizontal ABduction  Strengthening;Both;10 reps;Theraband    Theraband Level (Shoulder Horizontal ABduction)  Level 2 (Red)    External Rotation  Strengthening;Both;10 reps;Theraband    Theraband Level (Shoulder External Rotation)  Level 2 (Red)    Flexion  Strengthening;Both;10 reps;Theraband narrow and wide grip    Theraband Level (Shoulder Flexion)  Level 2 (Red)    Other Supine Exercises  D2 x 10 reps bilaterally with red theraband      Manual Therapy   Manual Therapy  Passive ROM    Soft tissue mobilization  to area around left mastectomy incision    Myofascial Release  In supine: crosshands to left chest in vertical and diagonal from left shoulder to middle abdomen; also to upper and lower chest in horizontal; left UEmyofascial pulling with movement into abduction, also to right axilla while pt was lying over foam roll    Passive ROM  supine over foam roll, prolonged bilateral pect stretches; left shoulder abduction stretch  [facility] Goals - 10/08/17 1242      CC Long Term Goal  #1   Title  Pt will be verbalize lymphedema risk reduction practices     Time  4    Period  Weeks    Status  New      CC Long Term Goal  #2   Title  Pt will be independent in a basic home exercise program for shoulder range of motion and strength     Time  4    Period  Weeks    Status  New      CC Long Term Goal  #3   Title  Pt will have 160 degrees of bilateral active shoulder flexion so that she can perform activies of daily living and household chores with greater ease     Time  4    Period  Weeks    Status  New      CC Long Term Goal  #4   Title  Pt will decrease Quick DASH score to < 35 indicating an improvment in functional use of left arm     Time  4    Period   Weeks    Status  New          Plan - 10/28/17 1615    Clinical Impression Statement  Pt reports continued improvement in tightness in bilateral shoulders. Today her right shoulder was more tight than her left. Instructed pt to lie over foam roll with arms outstretched at home for pec tightness. Also instructed pt in supine scapular strengthening exercises.     Rehab Potential  Excellent    Clinical Impairments Affecting Rehab Potential  none    PT Frequency  2x / week    PT Duration  4 weeks    PT Treatment/Interventions  ADLs/Self Care Home Management;Therapeutic activities;Therapeutic exercise;Patient/family education;Passive range of motion;Scar mobilization;Manual lymph drainage;Manual techniques    PT Next Visit Plan  Continue manual work (PROM, myofascial release), assess indep with supine scap and lying over foam roll    PT Home Exercise Plan  strength ABC program    Consulted and Agree with Plan of Care  Patient       Patient will benefit from skilled therapeutic intervention in order to improve the following deficits and impairments:  Decreased knowledge of precautions, Pain  Visit Diagnosis: Aftercare following surgery for neoplasm  Muscle weakness (generalized)     Problem List Patient Active Problem List   Diagnosis Date Noted  . Breast cancer in situ 09/06/2017  . 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 Low back pain 12/02/2016  . Left hamstring injury 11/01/2013  . Nonallopathic lesion of cervical region 11/01/2013  . Nonallopathic lesion of lumbosacral region 11/01/2013  . Nonallopathic lesion of thoracic region 11/01/2013    Allyson Sabal [facility] 10/28/2017, 4:19 PM  [place], [place], [address] Phone: [phone]   Fax:  [phone]  Name: Lovinia Snare, MD MRN: [id number] Date of Birth: 28-Feb-1951  Manus Gunning, PT 10/28/17 4:20  PM

## 2017-10-28 NOTE — Patient Instructions (Signed)

## 2017-11-04 ENCOUNTER — Encounter: Payer: Self-pay | Admitting: Physical Therapy

## 2017-11-04 ENCOUNTER — Ambulatory Visit: Payer: PPO | Admitting: Physical Therapy

## 2017-11-04 DIAGNOSIS — M6281 Muscle weakness (generalized): Secondary | ICD-10-CM

## 2017-11-04 DIAGNOSIS — Z483 Aftercare following surgery for neoplasm: Secondary | ICD-10-CM | POA: Diagnosis not present

## 2017-11-04 NOTE — Therapy (Signed)
[place], [place], [address] Phone: [phone]   Fax:  [phone]  Physical Therapy Treatment  Patient Details  Name: Whitney Mcmullen, MD MRN: [id number] Date of Birth: 08/21/1951 Referring Provider: Dr. Marlou Starks   Encounter Date: 11/04/2017  PT End of Session - 11/04/17 1716    Visit Number  5    Number of Visits  9    Date for PT Re-Evaluation  11/08/17    PT Start Time  4235    PT Stop Time  1600    PT Time Calculation (min)  45 min    Activity Tolerance  Patient tolerated treatment well    Behavior During Therapy  [facility] for tasks assessed/performed       Past Medical History:  Diagnosis Date  . Anxiety   . Arthritis   . Asthma    excerise induced  . Cancer [facility])    breast cancer  . Depression   . Hypothyroidism     Past Surgical History:  Procedure Laterality Date  . APPENDECTOMY  1983  . CHOLECYSTECTOMY  1975  . MASTECTOMY W/ SENTINEL NODE BIOPSY Bilateral 09/06/2017   Procedure: LEFT MASTECTOMY WITH LEFT SENTINEL LYMPH NODE BIOPSY AND  RIGHT PROPHYLACTIC MASTECTOMY;  Surgeon: [name], MD;  Location: [place];  Service: General;  Laterality: Bilateral;  . TONSILLECTOMY AND ADENOIDECTOMY      There were no vitals filed for this visit.  Subjective Assessment - 11/04/17 1709    Subjective  Pt states she is doing well.  she stretched out before she came here today.  She wants to keep coming and she gets something out of every session     Pertinent History  Pt with bilateral mastectomy on 09/06/2017 with 2 nodes removed on left side due to DCIS  She will not have to have chemo or radiation or hormone treatment , mild shoulder issues with left shoulder, but has history of shoulder issues on her right shoulder,     Patient Stated Goals  to monitor exercise and progress     Currently in Pain?  No/denies                      [facility] Adult PT Treatment/Exercise - 11/04/17  0001      Shoulder Exercises: Sidelying   Other Sidelying Exercises  horizontal abduction with posterior thoracic rotation       Shoulder Exercises: ROM/Strengthening   Other ROM/Strengthening Exercises  in quaduped, hand behind head and rotation posteriorly bringing elbow toward ceiling for thoracic rotation.     Other ROM/Strengthening Exercises  yellow ball under sacrum for pellvic tilts and rotation for acitve lower core myofascial release and then ball under thoracic area for scapular retraction and anterior chest stretch       Manual Therapy   Myofascial Release  In supine: crosshands to left chest in vertical and diagonal from left shoulder to middle abdomen; left shoulder in abduction and other hand at left flank; in right sidelying, left arm in abduction and release in opposite direction at left flank             PT Education - 11/04/17 1716    Education provided  Yes    Education Details  gave pt information about [employer] and yoga at [employer]) Educated  Patient    Methods  Handout    Comprehension  Verbalized understanding  [facility] Goals - 10/08/17 1242      CC Long Term Goal  #1   Title  Pt will be verbalize lymphedema risk reduction practices     Time  4    Period  Weeks    Status  New      CC Long Term Goal  #2   Title  Pt will be independent in a basic home exercise program for shoulder range of motion and strength     Time  4    Period  Weeks    Status  New      CC Long Term Goal  #3   Title  Pt will have 160 degrees of bilateral active shoulder flexion so that she can perform activies of daily living and household chores with greater ease     Time  4    Period  Weeks    Status  New      CC Long Term Goal  #4   Title  Pt will decrease Quick DASH score to < 35 indicating an improvment in functional use of left arm     Time  4    Period  Weeks    Status  New         Plan - 11/04/17 1717     Clinical Impression Statement  pt continues to improve and greatly benefits from myofascial release. Worked on thoracic mobility today and stretching overy yellow ball .    Clinical Impairments Affecting Rehab Potential  none    PT Frequency  2x / week    PT Duration  4 weeks    PT Treatment/Interventions  ADLs/Self Care Home Management;Therapeutic activities;Therapeutic exercise;Patient/family education;Passive range of motion;Scar mobilization;Manual lymph drainage;Manual techniques    PT Next Visit Plan  Continue manual work (PROM, myofascial release), assess indep with supine scap and lying over foam roll/ yellow ball.  consider progressing to [place] and wall retractions with theraband for scapular strength        Patient will benefit from skilled therapeutic intervention in order to improve the following deficits and impairments:     Visit Diagnosis: Aftercare following surgery for neoplasm  Muscle weakness (generalized)     Problem List Patient Active Problem List   Diagnosis Date Noted  . Breast cancer in situ 09/06/2017  . 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 Low back pain 12/02/2016  . Left hamstring injury 11/01/2013  . Nonallopathic lesion of cervical region 11/01/2013  . Nonallopathic lesion of lumbosacral region 11/01/2013  . Nonallopathic lesion of thoracic region 11/01/2013   Donato Heinz. Owens Shark PT  Norwood Levo 11/04/2017, 5:21 PM  [place], [place], [address] Phone: [phone]   Fax:  [phone]  Name: Whitney Hunsucker, MD MRN: [id number] Date of Birth: July 15, 1951

## 2017-11-10 ENCOUNTER — Ambulatory Visit: Payer: PPO | Attending: General Surgery

## 2017-11-10 DIAGNOSIS — Z483 Aftercare following surgery for neoplasm: Secondary | ICD-10-CM | POA: Diagnosis not present

## 2017-11-10 DIAGNOSIS — M6281 Muscle weakness (generalized): Secondary | ICD-10-CM | POA: Insufficient documentation

## 2017-11-10 NOTE — Therapy (Signed)
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 Marlou Starks   Encounter Date: 11/10/2017  PT End of Session - 11/10/17 1349    Visit Number  6    Number of Visits  9    Date for PT Re-Evaluation  11/08/17    PT Start Time  1301    PT Stop Time  4193    PT Time Calculation (min)  46 min    Activity Tolerance  Patient tolerated treatment well    Behavior During Therapy  [facility] for tasks assessed/performed       Past Medical History:  Diagnosis Date  . Anxiety   . Arthritis   . Asthma    excerise induced  . Cancer [facility])    breast cancer  . Depression   . Hypothyroidism     Past Surgical History:  Procedure Laterality Date  . APPENDECTOMY  1983  . CHOLECYSTECTOMY  1975  . MASTECTOMY W/ SENTINEL NODE BIOPSY Bilateral 09/06/2017   Procedure: LEFT MASTECTOMY WITH LEFT SENTINEL LYMPH NODE BIOPSY AND  RIGHT PROPHYLACTIC MASTECTOMY;  Surgeon: [name], MD;  Location: [place];  Service: General;  Laterality: Bilateral;  . TONSILLECTOMY AND ADENOIDECTOMY      There were no vitals filed for this visit.  Subjective Assessment - 11/10/17 1303    Subjective  Doing well with HEP. Up to 3# with Strength ABC Program and 30 reps, also really like the D2 exercise with theraband. Walked 3 mi yesterday in about an hour and that felt.     Pertinent History  Pt with bilateral mastectomy on 09/06/2017 with 2 nodes removed on left side due to DCIS  She will not have to have chemo or radiation or hormone treatment , mild shoulder issues with left shoulder, but has history of shoulder issues on her right shoulder,     Patient Stated Goals  to monitor exercise and progress     Currently in Pain?  No/denies                      [facility] Adult PT  Treatment/Exercise - 11/10/17 0001      Manual Therapy   Myofascial Release  In Supine: Horizontal and Vertical cross hands techique to bil chest wall then individual sides, also bil UE pulling during P/ROM and releases also to bil axillae during stretching    Passive ROM  To bil UE's in supine during myofascial release into flexion, abduction and D2.                      [facility] Goals - 10/08/17 1242      CC Long Term Goal  #1   Title  Pt will be verbalize lymphedema risk reduction practices     Time  4    Period  Weeks    Status  New      CC Long Term Goal  #2   Title  Pt will be independent in a basic home exercise program for shoulder range of motion and strength     Time  4    Period  Weeks    Status  New      CC Long Term Goal  #3   Title  Pt will have 160 degrees of  bilateral active shoulder flexion so that she can perform activies of daily living and household chores with greater ease     Time  4    Period  Weeks    Status  New      CC Long Term Goal  #4   Title  Pt will decrease Quick DASH score to < 35 indicating an improvment in functional use of left arm     Time  4    Period  Weeks    Status  New         Plan - 11/10/17 1352    Clinical Impression Statement  Focused on manual therapy today as pt reports independence with HEP and feels myofascial release is making most diference/improvement at this time. Pt appears to progressing well overall.     Rehab Potential  Excellent    Clinical Impairments Affecting Rehab Potential  none    PT Frequency  2x / week    PT Duration  4 weeks    PT Treatment/Interventions  ADLs/Self Care Home Management;Therapeutic activities;Therapeutic exercise;Patient/family education;Passive range of motion;Scar mobilization;Manual lymph drainage;Manual techniques    PT Next Visit Plan  Renewal or D/C next visit. Continue manual work (PROM, myofascial release), assess indep with supine scap and lying over foam  roll/ yellow ball.  consider progressing to [place] and wall retractions with theraband for scapular strength     Consulted and Agree with Plan of Care  Patient       Patient will benefit from skilled therapeutic intervention in order to improve the following deficits and impairments:  Decreased knowledge of precautions, Pain  Visit Diagnosis: Aftercare following surgery for neoplasm  Muscle weakness (generalized)     Problem List Patient Active Problem List   Diagnosis Date Noted  . Breast cancer in situ 09/06/2017  . 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 Low back pain 12/02/2016  . Left hamstring injury 11/01/2013  . Nonallopathic lesion of cervical region 11/01/2013  . Nonallopathic lesion of lumbosacral region 11/01/2013  . Nonallopathic lesion of thoracic region 11/01/2013    Otelia Limes, PTA 11/10/2017, 1:54 PM  [place], [place], [address] Phone: [phone]   Fax:  [phone]  Name: Whitney Faraci, MD MRN: [id number] Date of Birth: [birth year]

## 2017-11-10 NOTE — Progress Notes (Signed)
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  Recently had a mastectomy for breast cancer.  Going to rehabilitation but is doing very well.  Started to run about 22-3 times a week.  Is like she is having improvement in her strength.  She did not denies any new symptoms.    Past Medical History:  Diagnosis Date  . Anxiety   . Arthritis   . Asthma    excerise induced  . Cancer [facility])    breast cancer  . Depression   . Hypothyroidism    Past Surgical History:  Procedure Laterality Date  . APPENDECTOMY  1983  . CHOLECYSTECTOMY  1975  . MASTECTOMY W/ SENTINEL NODE BIOPSY Bilateral 09/06/2017   Procedure: LEFT MASTECTOMY WITH LEFT SENTINEL LYMPH NODE BIOPSY AND  RIGHT PROPHYLACTIC MASTECTOMY;  Surgeon: [name], MD;  Location: [facility];  Service: General;  Laterality: Bilateral;  . TONSILLECTOMY AND ADENOIDECTOMY     Social History   Socioeconomic History  . Marital status: Divorced    Spouse name: None  . Number of children: None  . Years of education: None  . Highest education level: None  Social Needs  . Financial resource strain: None  . Food insecurity - worry: None  . Food insecurity - inability: None  . Transportation needs - medical: None  . Transportation needs - non-medical: None  Occupational History  . None  Tobacco Use  . Smoking status: Former Smoker    Packs/day: 1.00    Years: 10.00    Pack years: 10.00    Types: Cigarettes    Last attempt to quit: 1991    Years since quitting: 28.0  . Smokeless tobacco: Never Used  Substance and Sexual Activity  . Alcohol use: Yes    Comment: rarely  . Drug use: No  . Sexual activity: None  Other Topics Concern  . None    Social History Narrative  . None   Allergies  Allergen Reactions  . Sulfonamide Derivatives     REACTION: rash   Family History  Problem Relation Age of Onset  . Cancer Mother   . Hyperlipidemia Mother   . Cancer Father   . Hyperlipidemia Father   . Birth defects Maternal Grandmother   . Birth defects Paternal Grandmother      Past medical history, social, surgical and family history all reviewed in electronic medical record.  No pertanent information unless stated regarding to the chief complaint.   Review of Systems:Review of systems updated and as accurate as of 11/11/17  No headache, visual changes, nausea, vomiting, diarrhea, constipation, dizziness, abdominal pain, skin rash, fevers, chills, night sweats, weight loss, swollen lymph nodes, body aches, joint swelling,  chest pain, shortness of breath, mood changes.  Positive muscle aches  Objective  Blood pressure 100/60, pulse 60, height 5\' 6"  (1.676 m), weight 124 lb (56.2 kg), SpO2 97 %. Systems examined below as of 11/11/17   General: No apparent distress alert and oriented x3 mood and affect normal, dressed appropriately.  HEENT: Pupils equal, extraocular movements intact  Respiratory: Patient's speak in full sentences and does not appear short of breath  Cardiovascular: No  lower extremity edema, non tender, no erythema  Skin: Warm dry intact with no signs of infection or rash on extremities or on axial skeleton.  Abdomen: Soft nontender  Neuro: Cranial nerves II through XII are intact, neurovascularly intact in all extremities with 2+ DTRs and 2+ pulses.  Lymph: No lymphadenopathy of posterior or anterior cervical chain or axillae bilaterally.  Gait normal with good balance and coordination.  MSK:  Non tender with full range of motion and good stability and symmetric strength and tone of shoulders, elbows, wrist, hip, knee and ankles bilaterally.  Back Exam:  Inspection: Mild degenerative scoliosis Motion: Flexion  45 deg, Extension 25 deg, Side Bending to 35 deg bilaterally,  Rotation to 45 deg bilaterally  SLR laying: Negative  XSLR laying: Negative  Palpable tenderness: Tender to palpation the paraspinal musculature lumbar spine.  Bilateral. FABER: Tightness only in the left. Sensory change: Gross sensation intact to all lumbar and sacral dermatomes.  Reflexes: 2+ at both patellar tendons, 2+ at achilles tendons, Babinski's downgoing.  Strength at foot  Plantar-flexion: 5/5 Dorsi-flexion: 5/5 Eversion: 5/5 Inversion: 5/5  Leg strength  Quad: 5/5 Hamstring: 5/5 Hip flexor: 5/5 Hip abductors: 5/5  Gait unremarkable.     Impression and Recommendations:     This case required medical decision making of moderate complexity.      Note: This dictation was prepared with Dragon dictation along with smaller phrase technology. Any transcriptional errors that result from this process are unintentional.

## 2017-11-11 ENCOUNTER — Encounter: Payer: Self-pay | Admitting: Family Medicine

## 2017-11-11 ENCOUNTER — Ambulatory Visit (INDEPENDENT_AMBULATORY_CARE_PROVIDER_SITE_OTHER): Payer: PPO | Admitting: Family Medicine

## 2017-11-11 VITALS — BP 100/60 | HR 60 | Ht 66.0 in | Wt 124.0 lb

## 2017-11-11 DIAGNOSIS — G8929 Other chronic pain: Secondary | ICD-10-CM | POA: Diagnosis not present

## 2017-11-11 DIAGNOSIS — M999 Biomechanical lesion, unspecified: Secondary | ICD-10-CM

## 2017-11-11 DIAGNOSIS — M545 Low back pain: Secondary | ICD-10-CM | POA: Diagnosis not present

## 2017-11-11 NOTE — Assessment & Plan Note (Signed)
Patient has not been working out regularly secondary to her recent surgery.  Encouraged her to start increasing slowly.  Has done very well with rehabilitation after her surgery at this moment.  We discussed diet changes that could be beneficial as well.  Follow-up with me again in 4-6 weeks

## 2017-11-11 NOTE — Assessment & Plan Note (Signed)
Decision today to treat with OMT was based on Physical Exam  After verbal consent patient was treated with HVLA, ME, FPR techniques in cervical, thoracic, rib, lumbar and sacral areas  Patient tolerated the procedure well with improvement in symptoms  Patient given exercises, stretches and lifestyle modifications  See medications in patient instructions if given  Patient will follow up in 4 weeks 

## 2017-11-11 NOTE — Patient Instructions (Signed)
Good to see you  Stay active.  Thank you for letting me pick your brain  See me again in 4-5 weeks

## 2017-11-12 ENCOUNTER — Ambulatory Visit: Payer: PPO | Admitting: Physical Therapy

## 2017-11-12 ENCOUNTER — Encounter: Payer: Self-pay | Admitting: Physical Therapy

## 2017-11-12 DIAGNOSIS — Z483 Aftercare following surgery for neoplasm: Secondary | ICD-10-CM

## 2017-11-12 DIAGNOSIS — M6281 Muscle weakness (generalized): Secondary | ICD-10-CM

## 2017-11-12 NOTE — Therapy (Signed)
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 Marlou Starks   Encounter Date: 11/12/2017  PT End of Session - 11/12/17 1235    Visit Number  7    Number of Visits  17    Date for PT Re-Evaluation  12/13/17    PT Start Time  1020    PT Stop Time  1100    PT Time Calculation (min)  40 min    Activity Tolerance  Patient tolerated treatment well    Behavior During Therapy  [facility] for tasks assessed/performed       Past Medical History:  Diagnosis Date  . Anxiety   . Arthritis   . Asthma    excerise induced  . Cancer [facility])    breast cancer  . Depression   . Hypothyroidism     Past Surgical History:  Procedure Laterality Date  . APPENDECTOMY  1983  . CHOLECYSTECTOMY  1975  . MASTECTOMY W/ SENTINEL NODE BIOPSY Bilateral 09/06/2017   Procedure: LEFT MASTECTOMY WITH LEFT SENTINEL LYMPH NODE BIOPSY AND  RIGHT PROPHYLACTIC MASTECTOMY;  Surgeon: [name], MD;  Location: [facility];  Service: General;  Laterality: Bilateral;  . TONSILLECTOMY AND ADENOIDECTOMY      There were no vitals filed for this visit.  Subjective Assessment - 11/12/17 1021    Subjective  Pt report she still has tighness in her left  chest and below the ribs.  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  No/denies         [facility] PT Assessment - 11/12/17 0001      Palpation   Palpation comment  tight cord palpated in left chest from axilla to incision, also tightness in left pec major muscle                   [facility] Adult PT Treatment/Exercise - 11/12/17 0001      Shoulder Exercises: Stretch   Other Shoulder Stretches  in sidleying, with horizontal abduction and backward thoracic rotaiton with therpist stabllizing lumbar spine and rib cage for more isolated stretching 5 times on each side.  Also in standing with one hand on wall and other behind head for thoracic stretch     Other Shoulder Stretches  over squishy ball at thoracic spine for extension and chest opening. with gentle pec minor stretch from therapist.      Manual Therapy   Manual Therapy  Soft tissue mobilization;Myofascial release    Manual therapy comments  areas of cording at left chest from axilla to insiciona     Soft tissue mobilization  to skin on chest over scars and pec major muscles     Myofascial Release  In supine: crosshands to left chest in vertical and diagonal from left shoulder to middle abdomen; left shoulder in abduction and other hand at left flank;  in right sidelying, left arm in abduction and release in opposite direction at left flank                     Long Term Clinic Goals - 11/12/17 1027      CC Long Term Goal  #1   Title  Pt will be verbalize lymphedema risk reduction practices     Status  Achieved      CC Long Term Goal  #2   Title  Pt will be independent in a basic home exercise program for shoulder range of motion and strength     Status  Achieved      CC Long Term Goal  #3   Title  Pt will have 160 degrees of bilateral active shoulder flexion so that she can perform activies of daily living and household chores with greater ease     Baseline  175 degrees on 11/12/2017    Status  Achieved      CC Long Term Goal  #4   Title  Pt will decrease Quick DASH score to < 35 indicating an improvment in functional use of left arm      Period  Weeks    Status  On-going      CC Long Term Goal  #5   Title  Pt will report the cording and fascial restriction she feels in her left upper quadrant will be reduced by 50% ; she will have range of motion without pain or binding.     Time  4    Period  Weeks    Status  New         Plan - 11/12/17 1235    Clinical Impression Statement  Pt has made good progress but continues to have cording in left chest with pain and restriction with reaching activities.  She receives benefit from myofascial work and stretching and wants to continue for a few more weeks to further reduce this discomfort      Clinical Impairments Affecting Rehab Potential  none    PT Frequency  2x / week    PT Duration  4 weeks    PT Treatment/Interventions  ADLs/Self Care Home Management;Therapeutic activities;Therapeutic exercise;Patient/family education;Passive range of motion;Scar mobilization;Manual lymph drainage;Manual techniques    PT Next Visit Plan  Continue manual work (PROM, myofascial release), assess indep with supine scap and lying over foam roll/ yellow ball.  consider progressing to [place] and wall retractions with theraband for scapular strength     PT Home Exercise Plan  strength ABC program    Consulted and Agree with Plan of Care  Patient       Patient will benefit from skilled therapeutic intervention in order to improve the following deficits and impairments:  Decreased knowledge of precautions, Pain, Increased fascial restricitons, Decreased scar mobility, Impaired UE functional use  Visit Diagnosis: Aftercare following surgery for neoplasm  Muscle weakness (generalized)     Problem List Patient Active Problem List   Diagnosis Date Noted  . Breast cancer in situ 09/06/2017  . 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 Low back pain 12/02/2016  . Left hamstring injury 11/01/2013  . Nonallopathic lesion of cervical region 11/01/2013  . Nonallopathic lesion  of lumbosacral region 11/01/2013  . Nonallopathic lesion of thoracic region 11/01/2013   Donato Heinz. Owens Shark PT  Norwood Levo 11/12/2017, 12:40 PM  [place], [place], [address]  Phone: [phone]   Fax:  [phone]  Name: Whitney Sails, MD MRN: [id number] Date of Birth: 08/22/1951

## 2017-11-25 DIAGNOSIS — D809 Immunodeficiency with predominantly antibody defects, unspecified: Secondary | ICD-10-CM | POA: Diagnosis not present

## 2017-12-09 ENCOUNTER — Ambulatory Visit: Payer: PPO | Admitting: Family Medicine

## 2017-12-12 NOTE — Progress Notes (Signed)
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  Has been seen patient for quite some time.  History recently of breast cancer.  Feels that the back pain seems to be somewhat worsening.  More local the back.  Fairly severe.  Catches her when she makes certain movements.  Severe 9 out of 10 pain.  No radiation of pain going down the legs.     Past Medical History:  Diagnosis Date  . Anxiety   . Arthritis   . Asthma    excerise induced  . Cancer [facility])    breast cancer  . Depression   . Hypothyroidism    Past Surgical History:  Procedure Laterality Date  . APPENDECTOMY  1983  . CHOLECYSTECTOMY  1975  . MASTECTOMY W/ SENTINEL NODE BIOPSY Bilateral 09/06/2017   Procedure: LEFT MASTECTOMY WITH LEFT SENTINEL LYMPH NODE BIOPSY AND  RIGHT PROPHYLACTIC MASTECTOMY;  Surgeon: [name], MD;  Location: [facility];  Service: General;  Laterality: Bilateral;  . TONSILLECTOMY AND ADENOIDECTOMY     Social History   Socioeconomic History  . Marital status: Divorced    Spouse name: None  . Number of children: None  . Years of education: None  . Highest education level: None  Social Needs  . Financial resource strain: None  . Food insecurity - worry: None  . Food insecurity - inability: None  . Transportation needs - medical: None  . Transportation needs - non-medical: None  Occupational History  . None  Tobacco Use  . Smoking status: Former Smoker    Packs/day: 1.00    Years: 10.00    Pack years: 10.00    Types: Cigarettes    Last attempt to quit: 1991    Years since quitting: 28.1  . Smokeless tobacco: Never Used  Substance and Sexual Activity  . Alcohol use: Yes    Comment: rarely  . Drug use: No  . Sexual activity: None  Other Topics Concern  . None  Social History Narrative  .  None   Allergies  Allergen Reactions  . Sulfonamide Derivatives     REACTION: rash   Family History  Problem Relation Age of Onset  . Cancer Mother   . Hyperlipidemia Mother   . Cancer Father   . Hyperlipidemia Father   . Birth defects Maternal Grandmother   . Birth defects Paternal Grandmother      Past medical history, social, surgical and family history all reviewed in electronic medical record.  No pertanent information unless stated regarding to the chief complaint.   Review of Systems:Review of systems updated and as accurate as of 12/13/17  No headache, visual changes, nausea, vomiting, diarrhea, constipation, dizziness, abdominal pain, skin rash, fevers, chills, night sweats, weight loss, swollen lymph nodes, body aches, joint swelling, chest pain, shortness of breath, mood changes.  Positive muscle aches  Objective  Blood pressure 104/60, pulse 69, height 5\' 6"  (1.676 m), weight 123 lb (55.8 kg), SpO2 97 %. Systems examined below as of 12/13/17   General: No apparent distress alert and oriented x3 mood and affect normal, dressed appropriately.  HEENT: Pupils equal, extraocular movements intact  Respiratory: Patient's speak in full sentences and does not appear short of breath  Cardiovascular: No lower extremity edema, non tender, no erythema  Skin: Warm  dry intact with no signs of infection or rash on extremities or on axial skeleton.  Abdomen: Soft nontender  Neuro: Cranial nerves II through XII are intact, neurovascularly intact in all extremities with 2+ DTRs and 2+ pulses.  Lymph: No lymphadenopathy of posterior or anterior cervical chain or axillae bilaterally.  Gait normal with good balance and coordination.  MSK:  Non tender with full range of motion and good stability and symmetric strength and tone of shoulders, elbows, wrist, hip, knee and ankles bilaterally.  Back Exam:  Inspection: Loss of range of motion and some loss of lordosis Motion: Flexion 30 deg,  Extension 25 deg, Side Bending to 25 deg bilaterally,  Rotation to 35 deg bilaterally  SLR laying: Negative  XSLR laying: Negative  Palpable tenderness: Severe tenderness over the left sacroiliac joint in the L5 lumbar area.Marland Kitchen FABER: Tightness on the left. Sensory change: Gross sensation intact to all lumbar and sacral dermatomes.  Reflexes: 2+ at both patellar tendons, 2+ at achilles tendons, Babinski's downgoing.  Strength at foot  Plantar-flexion: 5/5 Dorsi-flexion: 5/5 Eversion: 5/5 Inversion: 5/5  Leg strength  Quad: 5/5 Hamstring: 5/5 Hip flexor: 5/5 Hip abductors: 4/5 on the left and 4+ out of 5 on the right Gait unremarkable.  Osteopathic findings C2 flexed rotated and side bent right C6 flexed rotated and side bent left T3 extended rotated and side bent right inhaled third rib L5 flexed rotated and side bent right Sacrum left on left    Impression and Recommendations:     This case required medical decision making of moderate complexity.      Note: This dictation was prepared with Dragon dictation along with smaller phrase technology. Any transcriptional errors that result from this process are unintentional.

## 2017-12-13 ENCOUNTER — Ambulatory Visit (INDEPENDENT_AMBULATORY_CARE_PROVIDER_SITE_OTHER)
Admission: RE | Admit: 2017-12-13 | Discharge: 2017-12-13 | Disposition: A | Discharge status: Home / Self Care | Payer: PPO | Source: Ambulatory Visit | Attending: Family Medicine | Admitting: Family Medicine

## 2017-12-13 ENCOUNTER — Ambulatory Visit (INDEPENDENT_AMBULATORY_CARE_PROVIDER_SITE_OTHER): Payer: PPO | Admitting: Family Medicine

## 2017-12-13 ENCOUNTER — Encounter: Payer: Self-pay | Admitting: Family Medicine

## 2017-12-13 ENCOUNTER — Other Ambulatory Visit: Payer: Self-pay | Admitting: Family Medicine

## 2017-12-13 ENCOUNTER — Ambulatory Visit
Admission: RE | Admit: 2017-12-13 | Discharge: 2017-12-13 | Disposition: A | Discharge status: Home / Self Care | Payer: PPO | Source: Ambulatory Visit | Attending: Family Medicine | Admitting: Family Medicine

## 2017-12-13 VITALS — BP 104/60 | HR 69 | Ht 66.0 in | Wt 123.0 lb

## 2017-12-13 DIAGNOSIS — M5416 Radiculopathy, lumbar region: Secondary | ICD-10-CM

## 2017-12-13 DIAGNOSIS — M5136 Other intervertebral disc degeneration, lumbar region: Secondary | ICD-10-CM | POA: Diagnosis not present

## 2017-12-13 DIAGNOSIS — M545 Low back pain, unspecified: Secondary | ICD-10-CM

## 2017-12-13 DIAGNOSIS — G8929 Other chronic pain: Secondary | ICD-10-CM | POA: Diagnosis not present

## 2017-12-13 DIAGNOSIS — M999 Biomechanical lesion, unspecified: Secondary | ICD-10-CM

## 2017-12-13 DIAGNOSIS — M533 Sacrococcygeal disorders, not elsewhere classified: Secondary | ICD-10-CM | POA: Diagnosis not present

## 2017-12-13 NOTE — Patient Instructions (Signed)
Good to see you as always Xrays downstairs Lets avoid extension exercises for now.  ice is a good idea.  See me again in 3 weeks and I will write you on the xray

## 2017-12-13 NOTE — Assessment & Plan Note (Signed)
Worsening pain on the left side of the back.  Patient has had more of a difficulty recently.  We will get x-rays.  History of the recent cancer as well.  Patient is going to find out follow-up with her gynecologist as well.  Tried osteopathic manipulation again.  Patient will follow up with me again in 4-8 weeks

## 2017-12-13 NOTE — Assessment & Plan Note (Signed)
Decision today to treat with OMT was based on Physical Exam  After verbal consent patient was treated with HVLA, ME, FPR techniques in cervical, thoracic, lumbar and sacral areas  Patient tolerated the procedure well with improvement in symptoms  Patient given exercises, stretches and lifestyle modifications  See medications in patient instructions if given  Patient will follow up in 4-8 weeks 

## 2017-12-14 ENCOUNTER — Ambulatory Visit: Payer: PPO | Attending: General Surgery | Admitting: Physical Therapy

## 2017-12-14 DIAGNOSIS — Z483 Aftercare following surgery for neoplasm: Secondary | ICD-10-CM | POA: Diagnosis not present

## 2017-12-14 DIAGNOSIS — M6281 Muscle weakness (generalized): Secondary | ICD-10-CM | POA: Insufficient documentation

## 2017-12-14 NOTE — Therapy (Signed)
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 Marlou Starks   Encounter Date: 12/14/2017  PT End of Session - 12/14/17 1741    Visit Number  8    Number of Visits  17    PT Start Time  1300    PT Stop Time  5374    PT Time Calculation (min)  49 min    Activity Tolerance  Patient tolerated treatment well    Behavior During Therapy  [facility] for tasks assessed/performed       Past Medical History:  Diagnosis Date  . Anxiety   . Arthritis   . Asthma    excerise induced  . Cancer [facility])    breast cancer  . Depression   . Hypothyroidism     Past Surgical History:  Procedure Laterality Date  . APPENDECTOMY  1983  . CHOLECYSTECTOMY  1975  . MASTECTOMY W/ SENTINEL NODE BIOPSY Bilateral 09/06/2017   Procedure: LEFT MASTECTOMY WITH LEFT SENTINEL LYMPH NODE BIOPSY AND  RIGHT PROPHYLACTIC MASTECTOMY;  Surgeon: [name], MD;  Location: [place];  Service: General;  Laterality: Bilateral;  . TONSILLECTOMY AND ADENOIDECTOMY      There were no vitals filed for this visit.  Subjective Assessment - 12/14/17 1304    Subjective  I've been doing my stretches and I saw my massage therapist, so I may not need much after today. I am very pleased with how my scars look.  I don't have tenderness by my left ribs. I had a breakthrough last week; I had normal energy.    Pertinent History  Pt with bilateral mastectomy on 09/06/2017 with 2 nodes removed on left side due to DCIS  She will not have to have chemo or radiation or hormone treatment , mild shoulder issues with left shoulder, but has history of shoulder issues on her right shoulder,     Currently in Pain?  Yes    Pain Score  1     Pain Location  Axilla    Pain Orientation  Left;Right    Aggravating Factors    with full shoulder flexion    Pain Relieving Factors  arm at rest         [facility] PT Assessment - 12/14/17 0001      AROM   Right Shoulder Flexion  166 Degrees    Right Shoulder ABduction  186 Degrees    Left Shoulder Flexion  162 Degrees    Left Shoulder ABduction  195 Degrees           Quick Dash - 12/14/17 0001    Open a tight or new jar  Mild difficulty    Do heavy household chores (wash walls, wash floors)  No difficulty    Carry a shopping bag or briefcase  No difficulty    Wash your back  No difficulty    Use a knife to cut food  No difficulty    Recreational activities in which you take some force or impact through your arm, shoulder, or hand (golf, hammering, tennis)  Mild difficulty    During the past week, to what extent has your arm, shoulder or hand problem interfered with your normal social activities with family, friends, neighbors, or groups?  Not at all    During the past week, to what  extent has your arm, shoulder or hand problem limited your work or other regular daily activities  Not at all    Arm, shoulder, or hand pain.  None    Tingling (pins and needles) in your arm, shoulder, or hand  None    Difficulty Sleeping  No difficulty    DASH Score  4.55 %            [facility] Adult PT Treatment/Exercise - 12/14/17 0001      Manual Therapy   Soft tissue mobilization  to skin on chest over scars and pec major muscles     Myofascial Release  In supine: crosshands across left axilla, from left upper arm to abdomen in diagonal; left UE pulling tried, but stretched arm more than chest areas; crosshands across right axilla and right upper arm to abdomen; focused more on left than right.  Cervical manual traction with concurrent sternal distraction.                     Long Term Clinic Goals - 12/14/17 1307      CC Long Term Goal  #1   Title  Pt will be verbalize lymphedema risk reduction practices     Status  Achieved      CC Long Term Goal  #2    Title  Pt will be independent in a basic home exercise program for shoulder range of motion and strength     Status  Achieved      CC Long Term Goal  #3   Title  Pt will have 160 degrees of bilateral active shoulder flexion so that she can perform activies of daily living and household chores with greater ease     Status  Achieved      CC Long Term Goal  #4   Title  Pt will decrease Quick DASH score to < 35 indicating an improvment in functional use of left arm     Status  Achieved      CC Long Term Goal  #5   Title  Pt will report the cording and fascial restriction she feels in her left upper quadrant will be reduced by 50% ; she will have range of motion without pain or binding.     Status  Achieved         Plan - 12/14/17 1742    Clinical Impression Statement  Pt. has not been seen for therapy in a month. During that time she has worked on her stretches as well as doing her strength ABC program. She feels she is doing well; ROM measurements are significantly improved and she has met all goals.  She feels recently had a breakthrough and has had more energy and felt better.    Rehab Potential  Excellent    Clinical Impairments Affecting Rehab Potential  none    PT Treatment/Interventions  ADLs/Self Care Home Management;Therapeutic activities;Therapeutic exercise;Patient/family education;Passive range of motion;Scar mobilization;Manual lymph drainage;Manual techniques    PT Next Visit Plan  Discharge    PT Home Exercise Plan  strength ABC program, stretching    Consulted and Agree with Plan of Care  Patient       Patient will benefit from skilled therapeutic intervention in order to improve the following deficits and impairments:  Decreased knowledge of precautions, Pain, Increased fascial restricitons, Decreased scar mobility, Impaired UE functional use  Visit Diagnosis: Aftercare following surgery for neoplasm  Muscle weakness (generalized)     Problem List Patient  Active Problem List   Diagnosis Date Noted  . Breast cancer in situ 09/06/2017  . 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 Low back pain 12/02/2016  . Left hamstring injury 11/01/2013  . Nonallopathic lesion of cervical region 11/01/2013  . Nonallopathic lesion of lumbosacral region 11/01/2013  . Nonallopathic lesion of thoracic region 11/01/2013    SALISBURY,DONNA 12/14/2017, 5:45 PM  [place], [place], [address] Phone: [phone]   Fax:  [phone]  Name: Nadya Hopwood, MD MRN: [id number] Date of Birth: 10-06-1951  PHYSICAL THERAPY DISCHARGE SUMMARY  Visits from Start of Care: 8  Current functional level related to goals / functional outcomes: All goals met as noted above.   Remaining deficits: Very minimal tightness remains in chest/shoulder areas.   Education / Equipment: Archivist program. Plan: Patient agrees to discharge.  Patient goals were partially met. Patient is being discharged due to meeting the stated rehab goals.  ?????    Serafina Royals, PT 12/14/17 5:47 PM

## 2017-12-20 DIAGNOSIS — E559 Vitamin D deficiency, unspecified: Secondary | ICD-10-CM | POA: Diagnosis not present

## 2017-12-20 DIAGNOSIS — E039 Hypothyroidism, unspecified: Secondary | ICD-10-CM | POA: Diagnosis not present

## 2017-12-20 DIAGNOSIS — Z5181 Encounter for therapeutic drug level monitoring: Secondary | ICD-10-CM | POA: Diagnosis not present

## 2017-12-20 DIAGNOSIS — E349 Endocrine disorder, unspecified: Secondary | ICD-10-CM | POA: Diagnosis not present

## 2017-12-20 DIAGNOSIS — E782 Mixed hyperlipidemia: Secondary | ICD-10-CM | POA: Diagnosis not present

## 2017-12-21 ENCOUNTER — Telehealth: Payer: Self-pay

## 2017-12-21 NOTE — Telephone Encounter (Signed)
SENT REFERRAL TO SCHEDULING 

## 2018-01-02 NOTE — Progress Notes (Signed)
[name] [place], [place] [address] Phone: [phone] Subjective:    I'm seeing this patient by the request  of:    CC: Back pain.  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  Has been doing home exercises, increasing activity, states that she is feeling better. She has been doing her rehab exercises but is not doing the IT band stretch.  Patient states still having the pain but not as severe.  Movements at this time.    Past Medical History:  Diagnosis Date  . Anxiety   . Arthritis   . Asthma    excerise induced  . Cancer [facility])    breast cancer  . Depression   . Hypothyroidism    Past Surgical History:  Procedure Laterality Date  . APPENDECTOMY  1983  . CHOLECYSTECTOMY  1975  . MASTECTOMY W/ SENTINEL NODE BIOPSY Bilateral 09/06/2017   Procedure: LEFT MASTECTOMY WITH LEFT SENTINEL LYMPH NODE BIOPSY AND  RIGHT PROPHYLACTIC MASTECTOMY;  Surgeon: [name], MD;  Location: [place];  Service: General;  Laterality: Bilateral;  . TONSILLECTOMY AND ADENOIDECTOMY     Social History   Socioeconomic History  . Marital status: Divorced    Spouse name: None  . Number of children: None  . Years of education: None  . Highest education level: None  Social Needs  . Financial resource strain: None  . Food insecurity - worry: None  . Food insecurity - inability: None  . Transportation needs - medical: None  . Transportation needs - non-medical: None  Occupational History  . None  Tobacco Use  . Smoking status: Former Smoker    Packs/day: 1.00    Years: 10.00    Pack years: 10.00    Types: Cigarettes    Last attempt to quit: 1991    Years since quitting: 28.1  . Smokeless tobacco: Never Used  Substance and Sexual Activity  . Alcohol use: Yes    Comment: rarely  . Drug use: No  . Sexual activity: None  Other Topics Concern  . None  Social History Narrative  . None     Allergies  Allergen Reactions  . Sulfonamide Derivatives     REACTION: rash   Family History  Problem Relation Age of Onset  . Cancer Mother   . Hyperlipidemia Mother   . Cancer Father   . Hyperlipidemia Father   . Birth defects Maternal Grandmother   . Birth defects Paternal Grandmother      Past medical history, social, surgical and family history all reviewed in electronic medical record.  No pertanent information unless stated regarding to the chief complaint.   Review of Systems:Review of systems updated and as accurate as of 01/03/18  No headache, visual changes, nausea, vomiting, diarrhea, constipation, dizziness, abdominal pain, skin rash, fevers, chills, night sweats, weight loss, swollen lymph nodes, body aches, joint swelling, muscle aches, chest pain, shortness of breath, mood changes.   Objective  Blood pressure 110/82, pulse 64, weight 118 lb (53.5 kg), SpO2 98 %. Systems examined below as of 01/03/18   General: No apparent distress alert and oriented x3 mood and affect normal, dressed appropriately.  HEENT: Pupils equal, extraocular movements intact  Respiratory: Patient's speak in full sentences and does not appear short of breath  Cardiovascular: No lower extremity edema, non tender, no erythema  Skin: Warm dry intact with no signs of infection or  rash on extremities or on axial skeleton.  Abdomen: Soft nontender  Neuro: Cranial nerves II through XII are intact, neurovascularly intact in all extremities with 2+ DTRs and 2+ pulses.  Lymph: No lymphadenopathy of posterior or anterior cervical chain or axillae bilaterally.  Gait normal with good balance and coordination.  MSK:  Non tender with full range of motion and good stability and symmetric strength and tone of shoulders, elbows, wrist, hip, knee and ankles bilaterally.  Back exam still shows significant tightness of the paraspinal musculature lumbar spine right greater than left.  Positive Faber on the  right side mild degenerative scoliosis noted Negative straight leg test.  Osteopathic findings C2 flexed rotated and side bent left  T2 extended rotated and side bent right inhaled rib T6 extended rotated and side bent left L2 flexed rotated and side bent right Sacrum right on right Pelvic shear noted.      Impression and Recommendations:     This case required medical decision making of moderate complexity.      Note: This dictation was prepared with Dragon dictation along with smaller phrase technology. Any transcriptional errors that result from this process are unintentional.

## 2018-01-03 ENCOUNTER — Ambulatory Visit (INDEPENDENT_AMBULATORY_CARE_PROVIDER_SITE_OTHER): Payer: PPO | Admitting: Family Medicine

## 2018-01-03 ENCOUNTER — Encounter: Payer: Self-pay | Admitting: Family Medicine

## 2018-01-03 VITALS — BP 110/82 | HR 64 | Wt 118.0 lb

## 2018-01-03 DIAGNOSIS — M999 Biomechanical lesion, unspecified: Secondary | ICD-10-CM

## 2018-01-03 DIAGNOSIS — M545 Low back pain, unspecified: Secondary | ICD-10-CM

## 2018-01-03 DIAGNOSIS — G8929 Other chronic pain: Secondary | ICD-10-CM | POA: Diagnosis not present

## 2018-01-03 DIAGNOSIS — R5383 Other fatigue: Secondary | ICD-10-CM | POA: Diagnosis not present

## 2018-01-03 DIAGNOSIS — D809 Immunodeficiency with predominantly antibody defects, unspecified: Secondary | ICD-10-CM | POA: Diagnosis not present

## 2018-01-03 NOTE — Patient Instructions (Signed)
Good to see you  Keep being active.  See me again in 6 weeks!

## 2018-01-03 NOTE — Assessment & Plan Note (Signed)
Continues to have low back pain.  Avoiding any type of rotational movement.  Discussed icing regimen and home to avoid.  Increase activity slowly over the course the next several days.  Follow-up again in 4-6 weeks

## 2018-01-03 NOTE — Assessment & Plan Note (Signed)
Decision today to treat with OMT was based on Physical Exam  After verbal consent patient was treated with HVLA, ME, FPR techniques in cervical, thoracic, lumbar and sacral areas  Patient tolerated the procedure well with improvement in symptoms  Patient given exercises, stretches and lifestyle modifications  See medications in patient instructions if given  Patient will follow up in 4-6 weeks 

## 2018-01-27 DIAGNOSIS — Z1211 Encounter for screening for malignant neoplasm of colon: Secondary | ICD-10-CM | POA: Diagnosis not present

## 2018-01-27 DIAGNOSIS — K5900 Constipation, unspecified: Secondary | ICD-10-CM | POA: Diagnosis not present

## 2018-01-27 DIAGNOSIS — Z800 Family history of malignant neoplasm of digestive organs: Secondary | ICD-10-CM | POA: Diagnosis not present

## 2018-02-08 ENCOUNTER — Ambulatory Visit (INDEPENDENT_AMBULATORY_CARE_PROVIDER_SITE_OTHER): Payer: PPO | Admitting: Cardiology

## 2018-02-08 ENCOUNTER — Encounter: Payer: Self-pay | Admitting: Cardiology

## 2018-02-08 VITALS — BP 104/58 | HR 55 | Ht 66.0 in | Wt 118.0 lb

## 2018-02-08 DIAGNOSIS — R9431 Abnormal electrocardiogram [ECG] [EKG]: Secondary | ICD-10-CM

## 2018-02-08 DIAGNOSIS — R002 Palpitations: Secondary | ICD-10-CM | POA: Diagnosis not present

## 2018-02-08 NOTE — Patient Instructions (Addendum)
MEDICATION INSTRUCTIONS    NO CHANGES   TEST  WILL SCHEDULE AT [place] [address] Your physician has requested that you have an echocardiogram. Echocardiography is a painless test that uses sound waves to create images of your heart. It provides your doctor with information about the size and shape of your heart and how well your heart's chambers and valves are working. This procedure takes approximately one hour. There are no restrictions for this procedure.  AND Your physician has recommended that you wear an event monitor 2 WEEKS. Event monitors are medical devices that record the heart's electrical activity. Doctors most often [place] these monitors to diagnose arrhythmias. Arrhythmias are problems with the speed or rhythm of the heartbeat. The monitor is a small, portable device. You can wear one while you do your normal daily activities. This is usually used to diagnose what is causing palpitations/syncope (passing out).(  THE  FIRST WEEK TAKE ITRACONAZOLE AND THE SECOND WEEK DO NOT TAKE ITRACONAZOLE)      Your physician recommends that you schedule a follow-up appointment in 6 - [place] [facility].

## 2018-02-08 NOTE — Progress Notes (Signed)
PCP: [name], Whitney [name]  Clinic Note: Chief Complaint  Patient presents with  . New Patient (Initial Visit)  . Palpitations  . Irregular Heart Beat  . Dizziness    HPI: Whitney Sails, Whitney [name] is a 67 y.o. female who is being seen today for the evaluation of palpitations at the request of [name], Whitney [name]. 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  She has been quite ill and has been on varying doses of antifungal agents.  She is also had to be on antidepressant agents and now is simply on trazodone to help her sleep.  Whitney Sails, Whitney [name] was referred by Dr. Justin Mend because of recurrent episodes of irregular heartbeats.  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  She seems to finally be turning the corner as far as that goes especially given the fact that she also was diagnosed with breast cancer and had to have mastectomy.  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  What she is describing is having spells during which she feels a little bit odd and will check her pulse and note being in bigeminy or trigeminy off and on.  She acknowledges that there may be an interaction between itraconazole and trazodone and has stopped her itraconazole for the last couple weeks.  She is noted that these episodes of feeling that premature beats have notably improved since making that adjustment.  Unfortunately she probably will need to be on something like  itraconazole.  The question is whether she can do so better than trazodone. She says that these episodes used to be quite frequent for the last couple months but then notably improved after holding her itraconazole.  She does not describe it as feeling palpitation, she just feels a little funny and cannot palpate the heartbeat.  She has not had any lightheadedness or dizziness associated with these palpitations, but just clearly has some occasional positional dizziness or feeling fatigue.  No syncope.  No TIA or amaurosis fugax.  She has no sensation whatsoever of any chest tightness or pressure with rest or exertion.  No PND, orthopnea or edema. She has some strange neurologic issues from her black mold infection in has a little bit of balance issues, but has not had any falls.  No claudication. She is not that active however.  ROS: A comprehensive was performed. Review of Systems  Constitutional: Positive for malaise/fatigue (Chronic since black mold infection.). Negative for chills and fever.  HENT: Positive for congestion. Negative for nosebleeds and sinus pain.   Respiratory: Negative for shortness of breath (Only because she is deconditioned.).   Cardiovascular: Negative for leg swelling.  Gastrointestinal: Negative for abdominal pain, blood in stool and constipation.       She does have some stomach issues and is taking Questran for her black mold.  This makes her bowel movements quite large.  Genitourinary: Negative for dysuria and hematuria.  Musculoskeletal: Positive for back pain, joint pain and myalgias. Negative for falls.  Endo/Heme/Allergies:  Negative for environmental allergies. Does not bruise/bleed easily.  Psychiatric/Behavioral: Positive for depression. The patient is nervous/anxious and has insomnia.        Mostly these are symptoms that all began at the time of her black mold infection.  All other systems reviewed and are negative.   I have reviewed and (if needed)  personally updated the patient's problem list, medications, allergies, past medical and surgical history, social and family history.   Past Medical History:  Diagnosis Date  . Anxiety   . Arthritis   . Asthma    excerise induced  . Breast cancer ([facility]) 08/2017   DCIS - S/P mastectomy  . Depression   . Hyperlipidemia   . Hypothyroidism   . Immune disorder ([facility])   . Systemic fungal infection 11/2016   Black mold -on maintenance antifungals.    Past Surgical History:  Procedure Laterality Date  . APPENDECTOMY  1983  . CHOLECYSTECTOMY  1975  . MASTECTOMY W/ SENTINEL NODE BIOPSY Bilateral 09/06/2017   Procedure: LEFT MASTECTOMY WITH LEFT SENTINEL LYMPH NODE BIOPSY AND  RIGHT PROPHYLACTIC MASTECTOMY;  Surgeon: [name], Whitney [name];  Location: [facility];  Service: General;  Laterality: Bilateral;  . TONSILLECTOMY AND ADENOIDECTOMY      Current Meds  Medication Sig  . Multiple Vitamins-Minerals (MULTIVITAMIN WITH MINERALS) tablet Take 1 tablet by mouth daily.  Marland Kitchen NATURE-THROID 65 MG tablet Take 120 mg by mouth daily.   . NYSTATIN PO Take 500 mg by mouth 2 (two) times daily.  . progesterone (PROMETRIUM) 200 MG capsule Take 400 mg by mouth at bedtime. 200mg   . THYROID PO Take 120 mg by mouth 2 (two) times daily.  . traZODone (DESYREL) 100 MG tablet Take 1 tablet (100 mg total) by mouth at bedtime.    Allergies  Allergen Reactions  . Sulfonamide Derivatives     REACTION: rash    Social History   Tobacco Use  . Smoking status: Former Smoker    Packs/day: 1.00    Years: 10.00    Pack years: 10.00    Types: Cigarettes    Last attempt to quit: 1991    Years since quitting: 28.2  . Smokeless tobacco: Never Used  Substance Use Topics  . Alcohol use: Yes    Comment: rarely  . 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     She lives alone.  Is divorced.  She has 2 cats that live with her.   She quit smoking in 1991.  She rarely drinks alcohol.   She is quite active.  Was a former runner.  But she is scared to run again since her black mold.  She does walking, yoga as well as vibration platform for at least 30 minutes at a time 4-5 days a week.    family history includes Birth defects in her maternal grandmother and paternal grandmother; Cancer in her father, mother, and sister; Healthy in her sister; Heart attack (age of onset: 41) in her paternal grandfather; Heart disease in her sister; Hyperlipidemia in her father and mother.  Wt Readings from Last 3 Encounters:  02/08/18 118 lb (53.5 kg)  01/03/18 118 lb (53.5 kg)  12/13/17 123 lb (55.8 kg)    PHYSICAL EXAM BP (!) 104/58 (BP Location: Left Arm, Patient Position: Sitting, Cuff Size: Normal)   Pulse Marland Kitchen)  55   Ht 5\' 6"  (1.676 m)   Wt 118 lb (53.5 kg)   BMI 19.05 kg/m  Physical Exam  Constitutional: She appears well-developed. No distress.  She does seem somewhat thin and frail.  Did not mention weight loss, but appears to have lost some weight.  HENT:  Head: Normocephalic and atraumatic.  Eyes: Pupils are equal, round, and reactive to light. Conjunctivae and EOM are normal.  Neck: Normal range of motion. Neck supple. No hepatojugular reflux and no JVD present. Carotid bruit is not present.  Cardiovascular: Normal rate, regular rhythm, normal heart sounds and intact distal pulses.  Occasional extrasystoles are present. PMI is not displaced. Exam reveals no gallop and no friction rub.  No murmur heard. Vitals reviewed.    Adult ECG Report  Rate: 90;  Rhythm: normal sinus rhythm and Rightward axis (90 degrees).  Cannot exclude septal MI, age undetermined.;   Narrative Interpretation: Otherwise normal EKG.   Other studies Reviewed: Additional studies/ records that were reviewed today include:  Recent Labs: Labs from December 20, 2017: TC 211, TG  111, LDL 123, HDL 67.  BUN/creatinine 20/0.85.  TSH 0.39.  Glucose 100.  LFTs normal.   ASSESSMENT / PLAN: Problem List Items Addressed This Visit    Palpitations - Primary    She really describes feeling irregular heartbeats more so than palpitations.  Is not a symptom. We need to know what it is were dealing with.  They are happening enough that she probably does not need to wear monitor for a whole month.  But probably needs to wear for a couple weeks in order to ensure we get enough recorded. For now I would not want to treat with any medications until we know what it is were dealing with.  Her resting heart rate is only 55 bpm it could simply be that she is having Wenckebach block or intermittent PVCs. Depending on what we see, may want to consider stress test, but at least we need an echocardiogram at this time to exclude structural abnormality, especially given her breast cancer and black mold issues.  --She will be out of town for part of next week, and would like to wait until she gets back after Easter to do the monitor.  She will go back on itraconazole for now, but then after wearing the monitor for a week, the plan is for her to hold the itraconazole so we can see if there is a difference.  We need to determine the interaction between itraconazole and trazodone      Relevant Orders   EKG 12-Lead (Completed)   ECHOCARDIOGRAM COMPLETE   CARDIAC EVENT MONITOR   Abnormal finding on EKG    Her EKG does have some pretty notable borderline Q waves in the septal leads.  We will evaluate with a 2D echo.  I think this is probably just lead placement.  Need to exclude structural abnormality however.      Relevant Orders   EKG 12-Lead (Completed)   ECHOCARDIOGRAM COMPLETE   CARDIAC EVENT MONITOR      I spent a total of 35 minutes with the patient and chart review. >  50% of the time was spent in direct patient consultation.   Current medicines are reviewed at length with the patient  today.  (+/- concerns) n/a The following changes have been made:  n.a  Patient Instructions  MEDICATION INSTRUCTIONS    NO CHANGES   TEST  WILL SCHEDULE AT [facility]  [place] Your physician has requested that you have an echocardiogram. Echocardiography is a painless test that uses sound waves to create images of your heart. It provides your doctor with information about the size and shape of your heart and how well your heart's chambers and valves are working. This procedure takes approximately one hour. There are no restrictions for this procedure.  AND Your physician has recommended that you wear an event monitor 2 WEEKS. Event monitors are medical devices that record the heart's electrical activity. Doctors most often [place] these monitors to diagnose arrhythmias. Arrhythmias are problems with the speed or rhythm of the heartbeat. The monitor is a small, portable device. You can wear one while you do your normal daily activities. This is usually used to diagnose what is causing palpitations/syncope (passing out).(  THE  FIRST WEEK TAKE ITRACONAZOLE AND THE SECOND WEEK DO NOT TAKE ITRACONAZOLE)      Your physician recommends that you schedule a follow-up appointment in 6 - [place] [facility].      Studies Ordered:   Orders Placed This Encounter  Procedures  . CARDIAC EVENT MONITOR  . EKG 12-Lead  . ECHOCARDIOGRAM COMPLETE      Glenetta Hew, M.D., M.S. Interventional Cardiologist   Pager # [phone] Phone # [phone] [address] [place], [place] [address]   Thank you for choosing [facility] at [facility]!!

## 2018-02-10 ENCOUNTER — Encounter: Payer: Self-pay | Admitting: Cardiology

## 2018-02-10 DIAGNOSIS — R002 Palpitations: Secondary | ICD-10-CM | POA: Insufficient documentation

## 2018-02-10 DIAGNOSIS — R9431 Abnormal electrocardiogram [ECG] [EKG]: Secondary | ICD-10-CM | POA: Insufficient documentation

## 2018-02-10 NOTE — Assessment & Plan Note (Signed)
Her EKG does have some pretty notable borderline Q waves in the septal leads.  We will evaluate with a 2D echo.  I think this is probably just lead placement.  Need to exclude structural abnormality however.

## 2018-02-10 NOTE — Assessment & Plan Note (Addendum)
She really describes feeling irregular heartbeats more so than palpitations.  Is not a symptom. We need to know what it is were dealing with.  They are happening enough that she probably does not need to wear monitor for a whole month.  But probably needs to wear for a couple weeks in order to ensure we get enough recorded. For now I would not want to treat with any medications until we know what it is were dealing with.  Her resting heart rate is only 55 bpm it could simply be that she is having Wenckebach block or intermittent PVCs. Depending on what we see, may want to consider stress test, but at least we need an echocardiogram at this time to exclude structural abnormality, especially given her breast cancer and black mold issues.  --She will be out of town for part of next week, and would like to wait until she gets back after Easter to do the monitor.  She will go back on itraconazole for now, but then after wearing the monitor for a week, the plan is for her to hold the itraconazole so we can see if there is a difference.  We need to determine the interaction between itraconazole and trazodone

## 2018-02-16 DIAGNOSIS — K5900 Constipation, unspecified: Secondary | ICD-10-CM | POA: Diagnosis not present

## 2018-02-16 DIAGNOSIS — Z800 Family history of malignant neoplasm of digestive organs: Secondary | ICD-10-CM | POA: Diagnosis not present

## 2018-02-16 DIAGNOSIS — Z1211 Encounter for screening for malignant neoplasm of colon: Secondary | ICD-10-CM | POA: Diagnosis not present

## 2018-02-27 NOTE — Progress Notes (Signed)
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  Patient does have a past medical history significant for bilateral mastectomy secondary to ductal carcinoma.  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  Patient has been fitted for a monitor.  Patient states overall has been doing relatively well.  Patient did do a lot of hiking last week when out of town.  Increase in his lower back discomfort.  Patient denies any radiation down her legs or any numbness.  Mild right knee pain.  Worse with going up or down stairs.  Rates the severity of pain is 8 out of 10.  Does not think there is been any swelling.  No numbness.  No significant instability     Past Medical History:  Diagnosis Date  . Anxiety   . Arthritis   . Asthma    excerise induced  . Breast cancer ([facility]) 08/2017   DCIS - S/P mastectomy  . Depression   . Hyperlipidemia   . Hypothyroidism   . Immune disorder ([facility])   . Systemic fungal infection 11/2016   Black mold -on maintenance antifungals.   Past Surgical History:  Procedure Laterality Date  . APPENDECTOMY  1983  . CHOLECYSTECTOMY  1975  . MASTECTOMY W/ SENTINEL NODE BIOPSY Bilateral 09/06/2017   Procedure: LEFT MASTECTOMY WITH LEFT SENTINEL LYMPH NODE BIOPSY AND  RIGHT PROPHYLACTIC MASTECTOMY;  Surgeon: [name], MD;  Location: [facility];  Service: General;  Laterality: Bilateral;  . TONSILLECTOMY AND ADENOIDECTOMY     Social History   Socioeconomic History  . Marital status: Divorced    Spouse name: Not on file  . Number of children: Not on file  . Years of education: Not on file  . Highest education level: Not on file  Occupational History  . Not on file  Social Needs  . Financial resource  strain: Not on file  . Food insecurity:    Worry: Not on file    Inability: Not on file  . Transportation needs:    Medical: Not on file    Non-medical: Not on file  Tobacco Use  . Smoking status: Former Smoker    Packs/day: 1.00    Years: 10.00    Pack years: 10.00    Types: Cigarettes    Last attempt to quit: 1991    Years since quitting: 28.3  . Smokeless tobacco: Never Used  Substance and Sexual Activity  . Alcohol use: Yes    Comment: rarely  . Drug use: No  . Sexual activity: Not on file  Lifestyle  . Physical activity:    Days per week: Not on file    Minutes per session: Not on file  . Stress: Not on file  Relationships  . Social connections:    Talks on phone: Not on file    Gets together: Not on file    Attends religious service: Not on file    Active member of club or organization: Not on file    Attends meetings of clubs or organizations: Not on file    Relationship status: Not on file  Other Topics Concern  . 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    Now essentially medically retired due to black mold infestation.   She lives alone.  Is divorced.  She has 2 cats that live with her.   She quit smoking in 1991.  She rarely drinks alcohol.   She is quite active.  Was a former runner.  But she is scared to run again since her black mold.  She does walking, yoga as well as vibration platform for at least 30 minutes at a time 4-5 days a week.   Allergies  Allergen Reactions  . Sulfonamide Derivatives     REACTION: rash   Family History  Problem Relation Age of Onset  . Cancer Mother   . Hyperlipidemia Mother   . Cancer Father   . Hyperlipidemia Father   . Birth defects Maternal Grandmother   . Birth defects Paternal Grandmother   . Heart disease Sister        short QT  . Heart attack Paternal Grandfather 38  . Cancer Sister   . Healthy Sister      Past medical  history, social, surgical and family history all reviewed in electronic medical record.  No pertanent information unless stated regarding to the chief complaint.   Review of Systems:Review of systems updated and as accurate as of 02/28/18  No headache, visual changes, nausea, vomiting, diarrhea, constipation, dizziness, abdominal pain, skin rash, fevers, chills, night sweats, weight loss, swollen lymph nodes, body aches, joint swelling,  chest pain, shortness of breath, mood changes.  Mild positive muscle aches  Objective  Blood pressure 110/66, pulse (!) 56, height 5\' 6"  (1.676 m), weight 120 lb (54.4 kg), SpO2 98 %. Systems examined below as of 02/28/18   General: No apparent distress alert and oriented x3 mood and affect normal, dressed appropriately.  HEENT: Pupils equal, extraocular movements intact  Respiratory: Patient's speak in full sentences and does not appear short of breath  Cardiovascular: No lower extremity edema, non tender, no erythema  Skin: Warm dry intact with no signs of infection or rash on extremities or on axial skeleton.  Abdomen: Soft nontender  Neuro: Cranial nerves II through XII are intact, neurovascularly intact in all extremities with 2+ DTRs and 2+ pulses.  Lymph: No lymphadenopathy of posterior or anterior cervical chain or axillae bilaterally.  Gait normal with good balance and coordination.  MSK:  Non tender with full range of motion and good stability and symmetric strength and tone of shoulders, elbows, wrist, hip, and ankles bilaterally.  Neck: Inspection unremarkable. No palpable stepoffs. Negative Spurling's maneuver. Mild limitation of 5 degrees of sidebending and rotation bilaterally Grip strength and sensation normal in bilateral hands Strength good C4 to T1 distribution No sensory change to C4 to T1 Negative Hoffman sign bilaterally Reflexes normal Tightness of the trapezius bilaterally  Back exam does show some tightness of the paraspinal  musculature bilaterally.  Lacks last 10 degrees of extension.  Negative Faber test.  Negative straight leg test.  Systemic strength of the bilateral lower extremities.  Deep tendon reflexes intact  Right knee exam shows some mild lateral translation of the kneecap.  Mild 2100 grind.  No pain over the medial joint line.  Osteopathic findings C2 flexed rotated and side bent right C4 flexed rotated and side bent left C7 flexed rotated and side bent left T3 extended rotated and side bent right inhaled third rib T5 extended rotated and side bent left L3 flexed rotated and side bent right Sacrum right on right  Impression and Recommendations:     This case required medical decision making of moderate complexity.      Note: This dictation was prepared with Dragon dictation along with smaller phrase technology. Any transcriptional errors that result from this process are unintentional.

## 2018-02-28 ENCOUNTER — Encounter: Payer: Self-pay | Admitting: Family Medicine

## 2018-02-28 ENCOUNTER — Ambulatory Visit (INDEPENDENT_AMBULATORY_CARE_PROVIDER_SITE_OTHER): Payer: PPO | Admitting: Family Medicine

## 2018-02-28 VITALS — BP 110/66 | HR 56 | Ht 66.0 in | Wt 120.0 lb

## 2018-02-28 DIAGNOSIS — G8929 Other chronic pain: Secondary | ICD-10-CM | POA: Diagnosis not present

## 2018-02-28 DIAGNOSIS — M545 Low back pain, unspecified: Secondary | ICD-10-CM

## 2018-02-28 DIAGNOSIS — M1711 Unilateral primary osteoarthritis, right knee: Secondary | ICD-10-CM | POA: Insufficient documentation

## 2018-02-28 DIAGNOSIS — M999 Biomechanical lesion, unspecified: Secondary | ICD-10-CM | POA: Diagnosis not present

## 2018-02-28 NOTE — Assessment & Plan Note (Signed)
Decision today to treat with OMT was based on Physical Exam  After verbal consent patient was treated with HVLA, ME, FPR techniques in cervical, thoracic, lumbar and sacral areas  Patient tolerated the procedure well with improvement in symptoms  Patient given exercises, stretches and lifestyle modifications  See medications in patient instructions if given  Patient will follow up in 4-8 weeks 

## 2018-02-28 NOTE — Patient Instructions (Signed)
Good to see you  Whitney [name] is your friend.  pennsaid pinkie amount topically 2 times daily as needed.  Stay active  Exercises 3 times a week.   See me again in 4-8 weeks

## 2018-02-28 NOTE — Assessment & Plan Note (Signed)
Stable.  Some mild increase in tightness.  No radicular symptoms.  Known some arthritic changes. Discussed with patient at great length.  Encourage her to do more core strengthening.  Discussed protein supplementation.  Follow-up again in 4 weeks

## 2018-02-28 NOTE — Assessment & Plan Note (Signed)
Patellofemoral.  Discussed icing regimen, home exercises given with athletic trainer.  Discussed bracing which patient declined.  Patient will follow-up again in 4 weeks

## 2018-03-01 ENCOUNTER — Other Ambulatory Visit: Payer: Self-pay | Admitting: Cardiology

## 2018-03-01 ENCOUNTER — Other Ambulatory Visit: Payer: Self-pay

## 2018-03-01 ENCOUNTER — Ambulatory Visit (INDEPENDENT_AMBULATORY_CARE_PROVIDER_SITE_OTHER): Payer: PPO

## 2018-03-01 ENCOUNTER — Ambulatory Visit (HOSPITAL_COMMUNITY): Payer: PPO | Attending: Cardiovascular Disease

## 2018-03-01 DIAGNOSIS — Z8249 Family history of ischemic heart disease and other diseases of the circulatory system: Secondary | ICD-10-CM | POA: Diagnosis not present

## 2018-03-01 DIAGNOSIS — R9431 Abnormal electrocardiogram [ECG] [EKG]: Secondary | ICD-10-CM

## 2018-03-01 DIAGNOSIS — R42 Dizziness and giddiness: Secondary | ICD-10-CM

## 2018-03-01 DIAGNOSIS — J45909 Unspecified asthma, uncomplicated: Secondary | ICD-10-CM | POA: Insufficient documentation

## 2018-03-01 DIAGNOSIS — R002 Palpitations: Secondary | ICD-10-CM

## 2018-03-01 DIAGNOSIS — Z853 Personal history of malignant neoplasm of breast: Secondary | ICD-10-CM | POA: Insufficient documentation

## 2018-03-01 DIAGNOSIS — E785 Hyperlipidemia, unspecified: Secondary | ICD-10-CM | POA: Diagnosis not present

## 2018-03-01 DIAGNOSIS — Z87891 Personal history of nicotine dependence: Secondary | ICD-10-CM | POA: Insufficient documentation

## 2018-03-04 ENCOUNTER — Telehealth: Payer: Self-pay | Admitting: *Deleted

## 2018-03-04 DIAGNOSIS — R002 Palpitations: Secondary | ICD-10-CM | POA: Diagnosis not present

## 2018-03-04 NOTE — Telephone Encounter (Signed)
-----   Message from Leonie Man, MD sent at 03/02/2018  9:13 PM EDT ----- Doristine Devoid news on echocardiogram result: Normal pump function of 60 to 65%.  No regional wall motion abnormality.  No valve lesions.  No evidence of pericardial effusion.  Nothing to explain palpitation or shortness of breath.  Glenetta Hew, MD

## 2018-03-04 NOTE — Telephone Encounter (Signed)
Spoke to patient. Result given . Verbalized understanding   Patient wanted  Dr Ellyn Hack to be aware that she went to [place] - walk and hiked @5  miles with out difficult - no shortness of breath or chest pain .  she is currently wearing event monitor - will be going out of own next week will not wear monitor for 4 days and place monitor on the follow ing Monday  - (stopping itraconazole) until she finish monitor on may 14 ,2019.

## 2018-04-05 ENCOUNTER — Ambulatory Visit: Payer: PPO | Admitting: Cardiology

## 2018-04-07 NOTE — Progress Notes (Signed)
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 Three weeks ago she injured her back in the lumbar spine area. She said that she had a friend walk on her back and that moved things back into place and she was good for the remainder of her move. She did pay attention to her back tightening up but overall has been doing ok as of recent.       Past Medical History:  Diagnosis Date  . Anxiety   . Arthritis   . Asthma    excerise induced  . 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 Depression   . Hyperlipidemia   . Hypothyroidism   . Immune disorder ([facility])   . Systemic fungal infection 11/2016   Black mold -on maintenance antifungals.   Past Surgical History:  Procedure Laterality Date  . APPENDECTOMY  1983  . CHOLECYSTECTOMY  1975  . MASTECTOMY W/ SENTINEL NODE BIOPSY Bilateral 09/06/2017   Procedure: LEFT MASTECTOMY WITH LEFT SENTINEL LYMPH NODE BIOPSY AND  RIGHT PROPHYLACTIC MASTECTOMY;  Surgeon: [name], MD;  Location: [facility];  Service: General;  Laterality: Bilateral;  . TONSILLECTOMY AND ADENOIDECTOMY     Social History   Socioeconomic History  . Marital status: Divorced    Spouse name: Not on file  . Number of children: Not on file  . Years of education: Not on file  . Highest education level: Not on file  Occupational History  . Not on file  Social Needs  . Financial resource strain: Not on file  . Food insecurity:    Worry: Not on file    Inability: Not on file  . Transportation needs:    Medical: Not on file    Non-medical: Not on file  Tobacco Use  . Smoking status: Former Smoker    Packs/day: 1.00    Years: 10.00    Pack years: 10.00    Types: Cigarettes    Last attempt to quit: 1991    Years since quitting: 28.4  . Smokeless tobacco: Never  Used  Substance and Sexual Activity  . Alcohol use: Yes    Comment: rarely  . Drug use: No  . Sexual activity: Not on file  Lifestyle  . Physical activity:    Days per week: Not on file    Minutes per session: Not on file  . Stress: Not on file  Relationships  . Social connections:    Talks on phone: Not on file    Gets together: Not on file    Attends religious service: Not on file    Active member of club or organization: Not on file    Attends meetings of clubs or organizations: Not on file    Relationship status: Not on file  Other Topics Concern  . 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   She lives alone.  Is divorced.  She has 2 cats that live with her.   She quit smoking in 1991.  She rarely drinks alcohol.   She is quite active.  Was a former runner.  But she is scared to run again since her  black mold.  She does walking, yoga as well as vibration platform for at least 30 minutes at a time 4-5 days a week.   Allergies  Allergen Reactions  . Sulfonamide Derivatives     REACTION: rash   Family History  Problem Relation Age of Onset  . Cancer Mother   . Hyperlipidemia Mother   . Cancer Father   . Hyperlipidemia Father   . Birth defects Maternal Grandmother   . Birth defects Paternal Grandmother   . Heart disease Sister        short QT  . Heart attack Paternal Grandfather 39  . Cancer Sister   . Healthy Sister      Past medical history, social, surgical and family history all reviewed in electronic medical record.  No pertanent information unless stated regarding to the chief complaint.   Review of Systems:Review of systems updated and as accurate as of 04/11/18  No headache, visual changes, nausea, vomiting, diarrhea, constipation, dizziness, abdominal pain, skin rash, fevers, chills, night sweats, weight loss,  swollen lymph nodes, body aches, joint swelling, chest pain, shortness of breath, mood changes.  Positive muscle aches  Objective  Blood pressure 110/64, pulse (!) 57, height 5\' 6"  (1.676 m), weight 116 lb (52.6 kg), SpO2 98 %. Systems examined below as of 04/11/18   General: No apparent distress alert and oriented x3 mood and affect normal, dressed appropriately.  HEENT: Pupils equal, extraocular movements intact  Respiratory: Patient's speak in full sentences and does not appear short of breath  Cardiovascular: No lower extremity edema, non tender, no erythema  Skin: Warm dry intact with no signs of infection or rash on extremities or on axial skeleton.  Abdomen: Soft nontender  Neuro: Cranial nerves II through XII are intact, neurovascularly intact in all extremities with 2+ DTRs and 2+ pulses.  Lymph: No lymphadenopathy of posterior or anterior cervical chain or axillae bilaterally.  Gait normal with good balance and coordination.  MSK:  Non tender with full range of motion and good stability and symmetric strength and tone of shoulders, elbows, wrist, hip, knee and ankles bilaterally.  Back exam shows some loss of lordosis with some mild degenerative scoliosis.  Tenderness to palpation of the paraspinal musculature in the thoracolumbar lumbosacral junction.  Tightness of the right femur.  Osteopathic findings C6 flexed rotated and side bent left T3 extended rotated and side bent right inhaled third rib T7 extended rotated and side bent left L2 flexed rotated and side bent right Sacrum right on right     Impression and Recommendations:     This case required medical decision making of moderate complexity.      Note: This dictation was prepared with Dragon dictation along with smaller phrase technology. Any transcriptional errors that result from this process are unintentional.

## 2018-04-11 ENCOUNTER — Ambulatory Visit: Payer: PPO | Admitting: Family Medicine

## 2018-04-11 ENCOUNTER — Ambulatory Visit (INDEPENDENT_AMBULATORY_CARE_PROVIDER_SITE_OTHER): Payer: PPO | Admitting: Family Medicine

## 2018-04-11 ENCOUNTER — Encounter: Payer: Self-pay | Admitting: Family Medicine

## 2018-04-11 VITALS — BP 110/64 | HR 57 | Ht 66.0 in | Wt 116.0 lb

## 2018-04-11 DIAGNOSIS — M999 Biomechanical lesion, unspecified: Secondary | ICD-10-CM | POA: Diagnosis not present

## 2018-04-11 DIAGNOSIS — G8929 Other chronic pain: Secondary | ICD-10-CM

## 2018-04-11 DIAGNOSIS — M545 Low back pain, unspecified: Secondary | ICD-10-CM

## 2018-04-11 NOTE — Assessment & Plan Note (Signed)
Still multifactorial.  Discussed with patient about icing regimen, home exercise, which activities to do which wants to avoid.  Patient is to increase activity slowly over the course the next several days.  Patient will come back and see me again 4 to 6 weeks

## 2018-04-11 NOTE — Patient Instructions (Signed)
Good to see you  You are great  See me again in 4 weeks

## 2018-04-11 NOTE — Assessment & Plan Note (Signed)
Decision today to treat with OMT was based on Physical Exam  After verbal consent patient was treated with HVLA, ME, FPR techniques in cervical, thoracic, lumbar and sacral areas  Patient tolerated the procedure well with improvement in symptoms  Patient given exercises, stretches and lifestyle modifications  See medications in patient instructions if given  Patient will follow up in 4-6 weeks 

## 2018-04-19 DIAGNOSIS — G9349 Other encephalopathy: Secondary | ICD-10-CM | POA: Diagnosis not present

## 2018-04-19 DIAGNOSIS — N951 Menopausal and female climacteric states: Secondary | ICD-10-CM | POA: Diagnosis not present

## 2018-04-25 DIAGNOSIS — M2550 Pain in unspecified joint: Secondary | ICD-10-CM | POA: Diagnosis not present

## 2018-04-25 DIAGNOSIS — G92 Toxic encephalopathy: Secondary | ICD-10-CM | POA: Diagnosis not present

## 2018-04-25 DIAGNOSIS — E559 Vitamin D deficiency, unspecified: Secondary | ICD-10-CM | POA: Diagnosis not present

## 2018-04-25 DIAGNOSIS — R5383 Other fatigue: Secondary | ICD-10-CM | POA: Diagnosis not present

## 2018-04-25 DIAGNOSIS — L309 Dermatitis, unspecified: Secondary | ICD-10-CM | POA: Diagnosis not present

## 2018-04-25 DIAGNOSIS — E279 Disorder of adrenal gland, unspecified: Secondary | ICD-10-CM | POA: Diagnosis not present

## 2018-04-25 DIAGNOSIS — R002 Palpitations: Secondary | ICD-10-CM | POA: Diagnosis not present

## 2018-05-18 DIAGNOSIS — E039 Hypothyroidism, unspecified: Secondary | ICD-10-CM | POA: Diagnosis not present

## 2018-05-18 DIAGNOSIS — R7309 Other abnormal glucose: Secondary | ICD-10-CM | POA: Diagnosis not present

## 2018-05-18 DIAGNOSIS — R978 Other abnormal tumor markers: Secondary | ICD-10-CM | POA: Diagnosis not present

## 2018-05-25 ENCOUNTER — Encounter: Payer: Self-pay | Admitting: Cardiology

## 2018-05-25 ENCOUNTER — Ambulatory Visit: Payer: PPO | Admitting: Cardiology

## 2018-05-25 DIAGNOSIS — R9431 Abnormal electrocardiogram [ECG] [EKG]: Secondary | ICD-10-CM

## 2018-05-25 DIAGNOSIS — R002 Palpitations: Secondary | ICD-10-CM

## 2018-05-25 MED ORDER — POTASSIUM CHLORIDE ER 10 MEQ PO CPCR: 10.0000 meq | ORAL_CAPSULE | Freq: Two times a day (BID) | ORAL | 3 refills | Status: DC

## 2018-05-25 NOTE — Progress Notes (Signed)
PCP: [name], MD  Clinic Note: No chief complaint on file.   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  She was noticing recurrent episodes of irregular heartbeats.  Recent Hospitalizations: none  Studies Personally Reviewed - (if available, images/films reviewed: From Epic Chart or Care Everywhere)  Cardiac event monitor showed mostly sinus rhythm with PACs and very rare PVCs.  She did have a couple atrial couplets and short atrial runs of 3-8 beats.  (None noted on symptom log.  2D echo April 2019: Normal EF 60 to 65%.  Normal valves.  Normal wall motion.  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  She notes that she did go out to [place] & did lots of hiking -- one day 1000 ft down 2 miles across & 1000 ft back up -- no CP & no more SOB than anyone else in her party.  No noted palpitations & no rapid HR.   No PND, orthopnea or edema. No palpitations, lightheadedness, dizziness, weakness or syncope/near syncope. No TIA/amaurosis fugax symptoms. No claudication.  We will good portion of our visit we talked about her cholesterol panel where we went through her cardia risk scan data carotid intimal thickening.  She was read as high risk with average CCA IMT 0.7.  Average max region 1.05 and plaque burden 5.8.  Talked about her various lipid panels with most recent being from [facility] total cholesterol 229, HDL 71, triglycerides 109 and LDL 136.  Apparently she is back now on her Lucrezia Starch which she was taking for her black mold.  She tells me that she was off of it for a month before these labs were drawn. With these labs her LDL particle number was 1652  she had high small LDL particle number, medium LDL particle number.  She had moderate risk HDL large pattern and a high risk LDL pattern.   hs-CRP was less than 0.3.  She then also talked about  Chelation therapy several other heavy metal measurements etc.  WILL BE SCANNED IN CHART.  Suffice to say, she is being close attention to her lipid panel.  ROS: A comprehensive was performed. Review of Systems  Constitutional: Negative for diaphoresis, malaise/fatigue and weight loss.  HENT: Negative for congestion and nosebleeds.   Respiratory: Negative for cough and shortness of breath.        Off & on cough  Cardiovascular:       Per HPI  Gastrointestinal: Negative for blood in stool, heartburn and melena.  Genitourinary: Negative for hematuria.  Musculoskeletal: Negative for back pain, joint pain and myalgias.  Psychiatric/Behavioral: Negative for memory loss. The patient is nervous/anxious. The patient does not have insomnia.   All other systems reviewed and are negative.  I have reviewed and (if needed) personally updated the patient's problem list, medications, allergies, past medical and surgical history, social and family history.   Past Medical History:  Diagnosis Date  . Anxiety   . Arthritis   . Asthma    excerise induced  . 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 Depression   . Hyperlipidemia   . Hypothyroidism   .  Immune disorder ([facility])   . Systemic fungal infection 11/2016   Black mold -on maintenance antifungals.    Past Surgical History:  Procedure Laterality Date  . APPENDECTOMY  1983  . CHOLECYSTECTOMY  1975  . MASTECTOMY W/ SENTINEL NODE BIOPSY Bilateral 09/06/2017   Procedure: LEFT MASTECTOMY WITH LEFT SENTINEL LYMPH NODE BIOPSY AND  RIGHT PROPHYLACTIC MASTECTOMY;  Surgeon: [name], MD;  Location: [facility];  Service: General;  Laterality: Bilateral;  . TONSILLECTOMY AND ADENOIDECTOMY      Current Meds  Medication Sig  . cholestyramine  (QUESTRAN) 4 g packet DISSOLVE 2 PACKETS IN 8 OUNCES LIQUID ONCE DAILY  . doxycycline (VIBRAMYCIN) 100 MG capsule   . itraconazole (SPORANOX) 100 MG capsule Take 100 mg by mouth 2 (two) times daily. Take 1 tablet 2 times a day for 3 weeks on 1 week off  . Multiple Vitamins-Minerals (MULTIVITAMIN WITH MINERALS) tablet Take 1 tablet by mouth daily.  Marland Kitchen nystatin (MYCOSTATIN) 500000 units TABS tablet Take 1 tablet by mouth 3 (three) times daily.   . potassium chloride (MICRO-K) 10 MEQ CR capsule Take 1 capsule (10 mEq total) by mouth 2 (two) times daily.  . progesterone (PROMETRIUM) 200 MG capsule Take 200 mg by mouth at bedtime. 200mg   . Succimer (DIMERCAPTOSUCCINIC ACID) CRYS Take 100 mg by mouth 3 (three) times daily with meals. Take 3 times a day during the weeks not on Sporanox and Doxycycline  . thyroid (NP THYROID) 120 MG tablet Take 120 mg by mouth daily.   . traZODone (DESYREL) 100 MG tablet Take 1 tablet (100 mg total) by mouth at bedtime.  . [DISCONTINUED] potassium chloride (MICRO-K) 10 MEQ CR capsule Take 10 mEq by mouth daily.    Allergies  Allergen Reactions  . Sulfonamide Derivatives     REACTION: rash    Social History   Tobacco Use  . Smoking status: Former Smoker    Packs/day: 1.00    Years: 10.00    Pack years: 10.00    Types: Cigarettes    Last attempt to quit: 1991    Years since quitting: 28.5  . Smokeless tobacco: Never Used  Substance Use Topics  . Alcohol use: Yes    Comment: rarely  . 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   She lives alone.  Is divorced.  She has 2 cats that live with her.   She quit smoking in 1991.  She rarely drinks alcohol.   She is quite active.  Was a former runner.  But she is scared to run again since her black mold.  She does walking, yoga as well as vibration  platform for at least 30 minutes at a time 4-5 days a week.    family history includes Birth defects in her maternal grandmother and paternal grandmother; Cancer in her father, mother, and sister; Healthy in her sister; Heart attack (age of onset: 75) in her paternal grandfather; Heart disease in her sister; Hyperlipidemia in her father and mother.  Wt Readings from Last 3 Encounters:  05/25/18 121 lb (54.9 kg)  04/11/18 116 lb (52.6 kg)  02/28/18 120 lb (54.4 kg)    PHYSICAL EXAM BP 112/64   Pulse 60   Ht 5\' 6"  (1.676 m)   Wt 121 lb (54.9 kg)   SpO2 98%   BMI 19.53  kg/m  Physical Exam  Constitutional: She is oriented to person, place, and time. She appears well-developed and well-nourished. No distress.  Neurological: She is alert and oriented to person, place, and time.  Psychiatric: She has a normal mood and affect. Her behavior is normal. Judgment and thought content normal.    Adult ECG Report n/a  Other studies Reviewed: Additional studies/ records that were reviewed today include:  Recent Labs:   Multiple labs reviewed (summary above) - scanned. Concerned about K+ level --  ASSESSMENT / PLAN: Problem List Items Addressed This Visit    Palpitations    Kurt really has not had much the way of any palpitations recently.  She did have some PACs and rare PVCs with some couplets and short runs on her monitor.  Certainly needs to be symptomatic.  However there just not been frequent and I would not be overly worried about them.  She was reassured of these results.  We talked about maybe potentially increasing her potassium supplement to 10 mg once twice daily.  Otherwise would avoid any AV nodal agents.      Abnormal finding on EKG    We will check a 2D echocardiogram which was essentially normal.  No structural of normalities.  No sign of wall motion abnormalities.          I spent a total of 48minutes with the patient and chart review. >  50% of the time was  spent in direct patient consultation.   Current medicines are reviewed at length with the patient today.  (+/- concerns)  The following changes have been made:  none  Patient Instructions  Medication Instructions:  Your physician has recommended you make the following change in your medication:  1) POTASSIUM: Increase your dose to 10 mEq, one tablet, by mouth TWICE daily   Labwork: none  Testing/Procedures: none  Follow-Up: Your physician wants you to follow-up in: 6 months with Dr. Ellyn Hack. You will receive a reminder letter in the mail two months in advance. If you don't receive a letter, please call our office to schedule the follow-up appointment.   Any Other Special Instructions Will Be Listed Below (If Applicable).     If you need a refill on your cardiac medications before your next appointment, please call your pharmacy.    Studies Ordered:   No orders of the defined types were placed in this encounter.     Glenetta Hew, M.D., M.S. Interventional Cardiologist   Pager # [phone] Phone # [phone] [address] [place], [place] [address]   Thank you for choosing Heartcare at [facility]!!

## 2018-05-25 NOTE — Patient Instructions (Signed)
Medication Instructions:  Your physician has recommended you make the following change in your medication:  1) POTASSIUM: Increase your dose to 10 mEq, one tablet, by mouth TWICE daily   Labwork: none  Testing/Procedures: none  Follow-Up: Your physician wants you to follow-up in: 6 months with Dr. Ellyn Hack. You will receive a reminder letter in the mail two months in advance. If you don't receive a letter, please call our office to schedule the follow-up appointment.   Any Other Special Instructions Will Be Listed Below (If Applicable).     If you need a refill on your cardiac medications before your next appointment, please call your pharmacy.

## 2018-05-31 NOTE — Assessment & Plan Note (Signed)
Whitney [name] really has not had much the way of any palpitations recently.  She did have some PACs and rare PVCs with some couplets and short runs on her monitor.  Certainly needs to be symptomatic.  However there just not been frequent and I would not be overly worried about them.  She was reassured of these results.  We talked about maybe potentially increasing her potassium supplement to 10 mg once twice daily.  Otherwise would avoid any AV nodal agents.

## 2018-05-31 NOTE — Assessment & Plan Note (Signed)
We will check a 2D echocardiogram which was essentially normal.  No structural of normalities.  No sign of wall motion abnormalities.

## 2018-06-02 DIAGNOSIS — R7309 Other abnormal glucose: Secondary | ICD-10-CM | POA: Diagnosis not present

## 2018-06-02 DIAGNOSIS — D0512 Intraductal carcinoma in situ of left breast: Secondary | ICD-10-CM | POA: Diagnosis not present

## 2018-06-29 ENCOUNTER — Encounter: Payer: Self-pay | Admitting: Family Medicine

## 2018-07-07 ENCOUNTER — Ambulatory Visit: Payer: PPO | Admitting: Family Medicine

## 2018-07-07 DIAGNOSIS — E782 Mixed hyperlipidemia: Secondary | ICD-10-CM | POA: Diagnosis not present

## 2018-07-07 DIAGNOSIS — E039 Hypothyroidism, unspecified: Secondary | ICD-10-CM | POA: Diagnosis not present

## 2018-07-27 DIAGNOSIS — C50912 Malignant neoplasm of unspecified site of left female breast: Secondary | ICD-10-CM | POA: Diagnosis not present

## 2018-07-27 DIAGNOSIS — Z01419 Encounter for gynecological examination (general) (routine) without abnormal findings: Secondary | ICD-10-CM | POA: Diagnosis not present

## 2018-07-27 DIAGNOSIS — Z8742 Personal history of other diseases of the female genital tract: Secondary | ICD-10-CM | POA: Diagnosis not present

## 2018-07-31 NOTE — Progress Notes (Signed)
[name] [address] [place], [place] [address] Phone: [phone] Subjective:   [name], am serving as a scribe for Dr. Hulan Saas.  CC: back pain   [id number]  Whitney [name], Whitney [name] is a 67 y.o. female coming in with complaint of back pain. [name] issues with her back since last visit. Had an incident where she was straddling her new partner while on the couch and leaned forward and rotated either left or right and her back went out. Had someone stand on her back the next day to put it into place. Wants to know which side she should not rotate in order to prevent this spasm from happening again.  Patient has had these symptoms for some time.  [name] radicular symptoms.  Just some tightness.  Has had only one exacerbation since we have seen patient.     Past Medical History:  Diagnosis Date  . Anxiety   . Arthritis   . Asthma    excerise induced  . 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 Depression   . Hyperlipidemia   . Hypothyroidism   . Immune disorder ([facility])   . Systemic fungal infection 11/2016   Black mold -on maintenance antifungals.   Past Surgical History:  Procedure Laterality Date  . APPENDECTOMY  1983  . CHOLECYSTECTOMY  1975  . MASTECTOMY W/ SENTINEL NODE BIOPSY Bilateral 09/06/2017   Procedure: LEFT MASTECTOMY WITH LEFT SENTINEL LYMPH NODE BIOPSY AND  RIGHT PROPHYLACTIC MASTECTOMY;  Surgeon: [name], Whitney [name];  Location: [facility];  Service: General;  Laterality: Bilateral;  . TONSILLECTOMY AND ADENOIDECTOMY     Social History   Socioeconomic History  . Marital status: Divorced    Spouse name: Not on file  . Number of children: Not on file  . Years of education: Not on file  . Highest education level: Not on file  Occupational History  . Not on file  Social Needs  . Financial resource strain: Not on file  . Food insecurity:    Worry: Not on file    Inability: Not on file  . Transportation needs:    Medical: Not on file    Non-medical: Not on file  Tobacco Use  . Smoking status: Former Smoker    Packs/day: 1.00    Years: 10.00    Pack years: 10.00    Types: Cigarettes    Last attempt to quit: 1991    Years since quitting: 28.7  . Smokeless tobacco: Never Used  Substance and Sexual Activity  . Alcohol use: Yes    Comment: rarely  . Drug use: [name]  . Sexual activity: Not on file  Lifestyle  . Physical activity:    Days per week: Not on file    Minutes per session: Not on file  . Stress: Not on file  Relationships  . Social connections:    Talks on phone: Not on file    Gets together: Not on file    Attends religious service: Not on file    Active member of club or organization: Not on file    Attends meetings of clubs or organizations: Not on file    Relationship status: Not on file  Other Topics Concern  . 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   She lives alone.  Is divorced.  She has 2 cats that live with her.   She quit smoking in 1991.  She rarely drinks alcohol.   She is quite active.  Was a former runner.  But she is scared to run again since her black mold.  She does walking, yoga as well as vibration platform for at least 30 minutes at a time 4-5 days a week.   Allergies  Allergen Reactions  . Sulfonamide Derivatives     REACTION: rash   Family History  Problem Relation Age of Onset  . Cancer Mother   . Hyperlipidemia Mother   . Cancer Father   . Hyperlipidemia Father   . Birth defects Maternal Grandmother   . Birth defects Paternal Grandmother   . Heart disease Sister        short QT  . Heart attack Paternal Grandfather 53  . Cancer Sister   . Healthy Sister     Current Outpatient Medications (Endocrine & Metabolic):  .  progesterone (PROMETRIUM) 200 MG capsule, Take 200 mg by mouth at bedtime.  200mg  .  thyroid (NP THYROID) 120 MG tablet, Take 120 mg by mouth daily.   Current Outpatient Medications (Cardiovascular):  .  cholestyramine (QUESTRAN) 4 g packet, DISSOLVE 2 PACKETS IN 8 OUNCES LIQUID ONCE DAILY     Current Outpatient Medications (Other):  .  doxycycline (VIBRAMYCIN) 100 MG capsule, 2 (two) times daily.  Marland Kitchen  itraconazole (SPORANOX) 100 MG capsule, Take 100 mg by mouth 2 (two) times daily. Take 1 tablet 2 times a day, 4 days a week. .  Multiple Vitamins-Minerals (MULTIVITAMIN WITH MINERALS) tablet, Take 1 tablet by mouth daily. .  NYSTATIN PO, Take 500 mg by mouth 3 (three) times daily.  .  potassium chloride (MICRO-K) 10 MEQ CR capsule, Take 1 capsule (10 mEq total) by mouth 2 (two) times daily. .  Succimer (DIMERCAPTOSUCCINIC ACID) CRYS, Take 100 mg by mouth 3 (three) times daily with meals. Take 3 times a day during the weeks not on Sporanox and Doxycycline .  traZODone (DESYREL) 100 MG tablet, Take 1 tablet (100 mg total) by mouth at bedtime.    Past medical history, social, surgical and family history all reviewed in electronic medical record.  [name] pertanent information unless stated regarding to the chief complaint.   Review of Systems:  [name] headache, visual changes, nausea, vomiting, diarrhea, constipation, dizziness, abdominal pain, skin rash, fevers, chills, night sweats, weight loss, swollen lymph nodes, body aches, joint swelling,chest pain, shortness of breath, mood changes.  Mild positive muscle aches  Objective  Blood pressure 100/66, pulse 66, height 5\' 6"  (1.676 m), weight 118 lb (53.5 kg), SpO2 98 %. of  General: [name] apparent distress alert and oriented x3 mood and affect normal, dressed appropriately.  HEENT: Pupils equal, extraocular movements intact  Respiratory: Patient's speak in full sentences and does not appear short of breath  Cardiovascular: [name] lower extremity edema, non tender, [name] erythema  Skin: Warm dry intact with [name] signs of infection or  rash on extremities or on axial skeleton.  Abdomen: Soft nontender  Neuro: Cranial nerves II through XII are intact, neurovascularly intact in all extremities with 2+ DTRs and 2+ pulses.  Lymph: [name] lymphadenopathy of posterior or anterior cervical chain or axillae bilaterally.  Gait normal with good balance and coordination.  MSK:  Non tender with full range of motion and good stability and symmetric strength and tone of shoulders, elbows, wrist, hip, knee and ankles bilaterally.  Back exam does  have some mild loss of lordosis with some mild scoliosis.  Patient does have mild limited with 5 degrees of extension and positive Faber test. Negative straight leg test  Osteopathic findings C2 flexed rotated and side bent right T3 extended rotated and side bent right inhaled third rib T5 extended rotated and side bent left L41 flexed rotated and side bent right Sacrum right on right     Impression and Recommendations:     This case required medical decision making of moderate complexity. The above documentation has been reviewed and is accurate and complete Whitney Pulley, Whitney [name]       Note: This dictation was prepared with Dragon dictation along with smaller phrase technology. Any transcriptional errors that result from this process are unintentional.

## 2018-08-01 ENCOUNTER — Ambulatory Visit: Payer: PPO | Admitting: Family Medicine

## 2018-08-01 ENCOUNTER — Encounter: Payer: Self-pay | Admitting: Family Medicine

## 2018-08-01 ENCOUNTER — Other Ambulatory Visit: Payer: Self-pay

## 2018-08-01 ENCOUNTER — Ambulatory Visit (INDEPENDENT_AMBULATORY_CARE_PROVIDER_SITE_OTHER)
Admission: RE | Admit: 2018-08-01 | Discharge: 2018-08-01 | Disposition: A | Discharge status: Home / Self Care | Payer: PPO | Source: Ambulatory Visit | Attending: Family Medicine | Admitting: Family Medicine

## 2018-08-01 VITALS — BP 100/66 | HR 66 | Ht 66.0 in | Wt 118.0 lb

## 2018-08-01 DIAGNOSIS — G8929 Other chronic pain: Secondary | ICD-10-CM

## 2018-08-01 DIAGNOSIS — Z853 Personal history of malignant neoplasm of breast: Secondary | ICD-10-CM

## 2018-08-01 DIAGNOSIS — D0590 Unspecified type of carcinoma in situ of unspecified breast: Secondary | ICD-10-CM | POA: Diagnosis not present

## 2018-08-01 DIAGNOSIS — M999 Biomechanical lesion, unspecified: Secondary | ICD-10-CM | POA: Diagnosis not present

## 2018-08-01 DIAGNOSIS — M545 Low back pain, unspecified: Secondary | ICD-10-CM

## 2018-08-01 DIAGNOSIS — M8588 Other specified disorders of bone density and structure, other site: Secondary | ICD-10-CM

## 2018-08-01 NOTE — Assessment & Plan Note (Signed)
Decision today to treat with OMT was based on Physical Exam  After verbal consent patient was treated with HVLA, ME, FPR techniques in cervical, thoracic, lumbar and sacral areas  Patient tolerated the procedure well with improvement in symptoms  Patient given exercises, stretches and lifestyle modifications  See medications in patient instructions if given  Patient will follow up in 6-8 weeks 

## 2018-08-01 NOTE — Patient Instructions (Signed)
Good to see  Dexa scan  See me again in 6-8 weeks

## 2018-08-01 NOTE — Assessment & Plan Note (Signed)
We will get a bone scan secondary to patient's history of cancer.  We will see what bone density shows.  After that we will discuss.

## 2018-08-01 NOTE — Assessment & Plan Note (Signed)
Low back pain.  Discussed icing regimen.  Patient does have a history of cancer we will get a bone density scan.  Discussed which activities of doing which wants to avoid.  Patient will follow-up with me again in 4 to 8 weeks

## 2018-08-15 DIAGNOSIS — R7989 Other specified abnormal findings of blood chemistry: Secondary | ICD-10-CM | POA: Diagnosis not present

## 2018-08-15 DIAGNOSIS — D809 Immunodeficiency with predominantly antibody defects, unspecified: Secondary | ICD-10-CM | POA: Diagnosis not present

## 2018-08-15 DIAGNOSIS — R5383 Other fatigue: Secondary | ICD-10-CM | POA: Diagnosis not present

## 2018-08-15 DIAGNOSIS — T560X4S Toxic effect of lead and its compounds, undetermined, sequela: Secondary | ICD-10-CM | POA: Diagnosis not present

## 2018-08-30 ENCOUNTER — Other Ambulatory Visit: Payer: Self-pay | Admitting: Cardiology

## 2018-09-26 ENCOUNTER — Encounter: Payer: Self-pay | Admitting: Family Medicine

## 2018-09-26 ENCOUNTER — Ambulatory Visit: Payer: PPO | Admitting: Family Medicine

## 2018-09-26 VITALS — BP 100/52 | HR 67 | Ht 66.0 in | Wt 121.0 lb

## 2018-09-26 DIAGNOSIS — M999 Biomechanical lesion, unspecified: Secondary | ICD-10-CM | POA: Diagnosis not present

## 2018-09-26 DIAGNOSIS — G8929 Other chronic pain: Secondary | ICD-10-CM | POA: Diagnosis not present

## 2018-09-26 DIAGNOSIS — M545 Low back pain, unspecified: Secondary | ICD-10-CM

## 2018-09-26 NOTE — Assessment & Plan Note (Signed)
Decision today to treat with OMT was based on Physical Exam  After verbal consent patient was treated with HVLA, ME, FPR techniques in cervical, thoracic, lumbar and sacral areas  Patient tolerated the procedure well with improvement in symptoms  Patient given exercises, stretches and lifestyle modifications  See medications in patient instructions if given  Patient will follow up in 4-6 weeks 

## 2018-09-26 NOTE — Progress Notes (Signed)
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 She continues to have left side lower back pain. She had some one walk on her back to relieve pain since last visit.  She has been working out more.  Working on core.  Working with a Physiological scientist 2 times a week  Slept wrong a couple weeks ago. Had pain that radiated into shoulder and chest. This pain has improved over time. Has had a massage since last visit. Pain over C6 right side. Did have radicular symptoms into right arm.        Past Medical History:  Diagnosis Date  . Anxiety   . Arthritis   . Asthma    excerise induced  . 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 Depression   . Hyperlipidemia   . Hypothyroidism   . Immune disorder ([facility])   . Systemic fungal infection 11/2016   Black mold -on maintenance antifungals.   Past Surgical History:  Procedure Laterality Date  . APPENDECTOMY  1983  . CHOLECYSTECTOMY  1975  . MASTECTOMY W/ SENTINEL NODE BIOPSY Bilateral 09/06/2017   Procedure: LEFT MASTECTOMY WITH LEFT SENTINEL LYMPH NODE BIOPSY AND  RIGHT PROPHYLACTIC MASTECTOMY;  Surgeon: [name], MD;  Location: [facility];  Service: General;  Laterality: Bilateral;  . TONSILLECTOMY AND ADENOIDECTOMY     Social History   Socioeconomic History  . Marital status: Divorced    Spouse name: Not on file  . Number of children: Not on file  . Years of education: Not on file  . Highest education level: Not on file  Occupational History  . Not on file  Social Needs  . Financial resource strain: Not on file  . Food insecurity:    Worry: Not on file    Inability: Not on file  . Transportation needs:    Medical: Not on file    Non-medical: Not on file  Tobacco Use  . Smoking status: Former Smoker   Packs/day: 1.00    Years: 10.00    Pack years: 10.00    Types: Cigarettes    Last attempt to quit: 1991    Years since quitting: 28.8  . Smokeless tobacco: Never Used  Substance and Sexual Activity  . Alcohol use: Yes    Comment: rarely  . Drug use: No  . Sexual activity: Not on file  Lifestyle  . Physical activity:    Days per week: Not on file    Minutes per session: Not on file  . Stress: Not on file  Relationships  . Social connections:    Talks on phone: Not on file    Gets together: Not on file    Attends religious service: Not on file    Active member of club or organization: Not on file    Attends meetings of clubs or organizations: Not on file    Relationship status: Not on file  Other Topics Concern  . 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   She lives alone.  Is divorced.  She has 2 cats that live with her.   She quit smoking in 1991.  She rarely drinks alcohol.   She is quite active.  Was a former runner.  But she is scared to run again since her black mold.  She does walking, yoga as well as vibration platform for at least 30 minutes at a time 4-5 days a week.   Allergies  Allergen Reactions  . Sulfonamide Derivatives     REACTION: rash   Family History  Problem Relation Age of Onset  . Cancer Mother   . Hyperlipidemia Mother   . Cancer Father   . Hyperlipidemia Father   . Birth defects Maternal Grandmother   . Birth defects Paternal Grandmother   . Heart disease Sister        short QT  . Heart attack Paternal Grandfather 76  . Cancer Sister   . Healthy Sister     Current Outpatient Medications (Endocrine & Metabolic):  .  progesterone (PROMETRIUM) 200 MG capsule, Take 200 mg by mouth at bedtime. 400mg  .  thyroid (NP THYROID) 120 MG tablet, Take 120 mg by mouth daily.   Current Outpatient Medications  (Cardiovascular):  .  cholestyramine (QUESTRAN) 4 g packet, Patient reports dissolving 1 packet in water.     Current Outpatient Medications (Other):  Marland Kitchen  Multiple Vitamins-Minerals (MULTIVITAMIN WITH MINERALS) tablet, Take 1 tablet by mouth daily. .  potassium chloride (MICRO-K) 10 MEQ CR capsule, TAKE 1 CAPSULE BY MOUTH TWICE DAILY .  Succimer (DIMERCAPTOSUCCINIC ACID) CRYS, Take 100 mg by mouth 3 (three) times daily with meals. Take 3 times a day during the weeks not on Sporanox and Doxycycline, 3 days every other week. .  traZODone (DESYREL) 100 MG tablet, Take 1 tablet (100 mg total) by mouth at bedtime.    Past medical history, social, surgical and family history all reviewed in electronic medical record.  No pertanent information unless stated regarding to the chief complaint.   Review of Systems:  No headache, visual changes, nausea, vomiting, diarrhea, constipation, dizziness, abdominal pain, skin rash, fevers, chills, night sweats, weight loss, swollen lymph nodes, body aches, joint swelling,  chest pain, shortness of breath, mood changes.  Positive muscle aches  Objective  Blood pressure (!) 100/52, pulse 67, height 5\' 6"  (1.676 m), weight 121 lb (54.9 kg), SpO2 98 %.    General: No apparent distress alert and oriented x3 mood and affect normal, dressed appropriately.  HEENT: Pupils equal, extraocular movements intact  Respiratory: Patient's speak in full sentences and does not appear short of breath  Cardiovascular: No lower extremity edema, non tender, no erythema  Skin: Warm dry intact with no signs of infection or rash on extremities or on axial skeleton.  Abdomen: Soft nontender  Neuro: Cranial nerves II through XII are intact, neurovascularly intact in all extremities with 2+ DTRs and 2+ pulses.  Lymph: No lymphadenopathy of posterior or anterior cervical chain or axillae bilaterally.  Gait normal with good balance and coordination.  MSK:  Non tender with full range of  motion and good stability and symmetric strength and tone of shoulders, elbows, wrist, hip, knee and ankles bilaterally.  Mild arthritic changes in multiple joints  Neck: Inspection mild loss of lordosis. No palpable stepoffs. Negative Spurling's maneuver. Significant limitation with right-sided rotation 90 today Grip strength and sensation normal in bilateral hands Strength good C4 to T1 distribution No sensory change to C4 to T1 Negative Hoffman sign bilaterally Reflexes normal Tightness of the right trapezius  Back Exam:  Inspection: Unremarkable  Motion: Flexion 45 deg, Extension 15 deg, Side Bending  to 35 deg bilaterally,  Rotation to 35 deg bilaterally  SLR laying: Negative  XSLR laying: Negative  Palpable tenderness: Tender to palpation in the paraspinal musculature in the lumbar spine right greater than left. FABER: Mild positive right. Sensory change: Gross sensation intact to all lumbar and sacral dermatomes.  Reflexes: 2+ at both patellar tendons, 2+ at achilles tendons, Babinski's downgoing.  Strength at foot  Plantar-flexion: 5/5 Dorsi-flexion: 5/5 Eversion: 5/5 Inversion: 5/5  Leg strength  Quad: 5/5 Hamstring: 5/5 Hip flexor: 5/5 Hip abductors: 5/5  Gait unremarkable.   Osteopathic findings C2 flexed rotated and side bent right C4 flexed rotated and side bent left T5 extended rotated and side bent left L3 flexed rotated and side bent left Sacrum right on right    Impression and Recommendations:     This case required medical decision making of moderate complexity. The above documentation has been reviewed and is accurate and complete Lyndal Pulley, DO       Note: This dictation was prepared with Dragon dictation along with smaller phrase technology. Any transcriptional errors that result from this process are unintentional.

## 2018-09-26 NOTE — Assessment & Plan Note (Signed)
Stable.  Degenerative disc disease and sacroiliac joint.  Discussed icing regimen and home exercise.  Discussed which activities to do and which ones to avoid.  Discussed avoiding certain activities.  Follow-up again in 4 to 8 weeks

## 2018-09-26 NOTE — Patient Instructions (Signed)
You are awesome Happy turkery day  See me again in 2 months

## 2018-09-27 ENCOUNTER — Encounter: Payer: Self-pay | Admitting: *Deleted

## 2018-11-08 DIAGNOSIS — D0512 Intraductal carcinoma in situ of left breast: Secondary | ICD-10-CM | POA: Diagnosis not present

## 2018-11-08 DIAGNOSIS — R5383 Other fatigue: Secondary | ICD-10-CM | POA: Diagnosis not present

## 2018-11-08 DIAGNOSIS — E7800 Pure hypercholesterolemia, unspecified: Secondary | ICD-10-CM | POA: Diagnosis not present

## 2018-11-08 DIAGNOSIS — E349 Endocrine disorder, unspecified: Secondary | ICD-10-CM | POA: Diagnosis not present

## 2018-11-19 NOTE — Progress Notes (Signed)
[name] [place], [place] [address] Phone: [phone] Subjective:   [name], am serving as a scribe for Dr. Hulan Saas.   CC: Back pain follow-up  [id number]  Whitney [name], Whitney [name] is a 68 y.o. female coming in with complaint of back pain. Patient states that she is sore today. She has been working out more lately. Ran a 5k on Thanksgiving. Has been running a couple times a week. Patient has pain in lumbar spine. States her pain is usually in SI joint.     Past Medical History:  Diagnosis Date  . Anxiety   . Arthritis   . Asthma    excerise induced  . 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 Depression   . Hyperlipidemia   . Hypothyroidism   . Immune disorder ([facility])   . Systemic fungal infection 11/2016   Black mold -on maintenance antifungals.   Past Surgical History:  Procedure Laterality Date  . APPENDECTOMY  1983  . CHOLECYSTECTOMY  1975  . MASTECTOMY W/ SENTINEL NODE BIOPSY Bilateral 09/06/2017   Procedure: LEFT MASTECTOMY WITH LEFT SENTINEL LYMPH NODE BIOPSY AND  RIGHT PROPHYLACTIC MASTECTOMY;  Surgeon: [name], Whitney [name];  Location: [facility];  Service: General;  Laterality: Bilateral;  . TONSILLECTOMY AND ADENOIDECTOMY     Social History   Socioeconomic History  . Marital status: Divorced    Spouse name: Not on file  . Number of children: Not on file  . Years of education: Not on file  . Highest education level: Not on file  Occupational History  . Not on file  Social Needs  . Financial resource strain: Not on file  . Food insecurity:    Worry: Not on file    Inability: Not on file  . Transportation needs:    Medical: Not on file    Non-medical: Not on file  Tobacco Use  . Smoking status: Former Smoker    Packs/day: 1.00    Years: 10.00    Pack years: 10.00    Types: Cigarettes    Last attempt to quit: 1991    Years since quitting: 29.0  . Smokeless tobacco: Never Used    Substance and Sexual Activity  . Alcohol use: Yes    Comment: rarely  . Drug use: No  . Sexual activity: Not on file  Lifestyle  . Physical activity:    Days per week: Not on file    Minutes per session: Not on file  . Stress: Not on file  Relationships  . Social connections:    Talks on phone: Not on file    Gets together: Not on file    Attends religious service: Not on file    Active member of club or organization: Not on file    Attends meetings of clubs or organizations: Not on file    Relationship status: Not on file  Other Topics Concern  . 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   She lives alone.  Is divorced.  She has 2 cats that live with her.   She quit smoking in 1991.  She rarely drinks alcohol.   She is quite active.  Was a former runner.  But she is scared to run again since her black mold.  She does walking, yoga  as well as vibration platform for at least 30 minutes at a time 4-5 days a week.   Allergies  Allergen Reactions  . Sulfonamide Derivatives     REACTION: rash   Family History  Problem Relation Age of Onset  . Cancer Mother   . Hyperlipidemia Mother   . Cancer Father   . Hyperlipidemia Father   . Birth defects Maternal Grandmother   . Birth defects Paternal Grandmother   . Heart disease Sister        short QT  . Heart attack Paternal Grandfather 64  . Cancer Sister   . Healthy Sister     Current Outpatient Medications (Endocrine & Metabolic):  .  progesterone (PROMETRIUM) 200 MG capsule, Take 200 mg by mouth at bedtime. 400mg  .  thyroid (NP THYROID) 120 MG tablet, Take 120 mg by mouth daily.   Current Outpatient Medications (Cardiovascular):  .  cholestyramine (QUESTRAN) 4 g packet, Patient reports dissolving 1 packet in water.     Current Outpatient Medications (Other):  Marland Kitchen  Multiple  Vitamins-Minerals (MULTIVITAMIN WITH MINERALS) tablet, Take 1 tablet by mouth daily. .  potassium chloride (MICRO-K) 10 MEQ CR capsule, TAKE 1 CAPSULE BY MOUTH TWICE DAILY .  Succimer (DIMERCAPTOSUCCINIC ACID) CRYS, Take 100 mg by mouth 3 (three) times daily with meals. Take 3 times a day during the weeks not on Sporanox and Doxycycline, 3 days every other week. .  traZODone (DESYREL) 100 MG tablet, Take 1 tablet (100 mg total) by mouth at bedtime.    Past medical history, social, surgical and family history all reviewed in electronic medical record.  No pertanent information unless stated regarding to the chief complaint.   Review of Systems:  No headache, visual changes, nausea, vomiting, diarrhea, constipation, dizziness, abdominal pain, skin rash, fevers, chills, night sweats, weight loss, swollen lymph nodes, body aches, joint swelling, , chest pain, shortness of breath, mood changes.  Positive muscle aches  Objective  Blood pressure 116/62, pulse 60, height 5\' 6"  (1.676 m), weight 121 lb (54.9 kg), SpO2 98 %.    General: No apparent distress alert and oriented x3 mood and affect normal, dressed appropriately.  HEENT: Pupils equal, extraocular movements intact  Respiratory: Patient's speak in full sentences and does not appear short of breath  Cardiovascular: No lower extremity edema, non tender, no erythema  Skin: Warm dry intact with no signs of infection or rash on extremities or on axial skeleton.  Abdomen: Soft nontender  Neuro: Cranial nerves II through XII are intact, neurovascularly intact in all extremities with 2+ DTRs and 2+ pulses.  Lymph: No lymphadenopathy of posterior or anterior cervical chain or axillae bilaterally.  Gait normal with good balance and coordination.  MSK:  Non tender with full range of motion and good stability and symmetric strength and tone of shoulders, elbows, wrist, hip, knee and ankles bilaterally.  Back Exam:  Inspection: Mild loss of  lordosis Motion: Flexion 45 deg, Extension 20 deg, Side Bending to 45 deg bilaterally,  Rotation to 45 deg bilaterally  SLR laying: Negative  XSLR laying: Negative  Palpable tenderness: Tender to palpation the paraspinal musculature lumbar spine right greater than left. FABER: tightness right greater than left . Sensory change: Gross sensation intact to all lumbar and sacral dermatomes.  Reflexes: 2+ at both patellar tendons, 2+ at achilles tendons, Babinski's downgoing.  Strength at foot  Plantar-flexion: 5/5 Dorsi-flexion: 5/5 Eversion: 5/5 Inversion: 5/5  Leg strength  Quad: 5/5 Hamstring: 5/5 Hip flexor: 5/5 Hip abductors:  4/5    Osteopathic findings C4 flexed rotated and side bent left T3 extended rotated and side bent right inhaled third rib T5 extended rotated and side bent left L4 flexed rotated and side bent left  Sacrum right on right .   Impression and Recommendations:     This case required medical decision making of moderate complexity. The above documentation has been reviewed and is accurate and complete Whitney Pulley, Whitney [name]       Note: This dictation was prepared with Dragon dictation along with smaller phrase technology. Any transcriptional errors that result from this process are unintentional.

## 2018-11-21 ENCOUNTER — Ambulatory Visit: Payer: PPO | Admitting: Family Medicine

## 2018-11-21 VITALS — BP 116/62 | HR 60 | Ht 66.0 in | Wt 121.0 lb

## 2018-11-21 DIAGNOSIS — M545 Low back pain, unspecified: Secondary | ICD-10-CM

## 2018-11-21 DIAGNOSIS — M999 Biomechanical lesion, unspecified: Secondary | ICD-10-CM | POA: Diagnosis not present

## 2018-11-21 DIAGNOSIS — G8929 Other chronic pain: Secondary | ICD-10-CM | POA: Diagnosis not present

## 2018-11-21 NOTE — Assessment & Plan Note (Signed)
Multifactorial.  Patient does have some arthritic changes but also significant tightness.  We discussed leg hip flexor stretching, hip abductor stretching, as well as piriformis.  Patient has responded well to manipulation.  Encourage patient to monitor diet.  Discussed different supplementation including iron.  Follow-up with me again in 2 months

## 2018-11-21 NOTE — Assessment & Plan Note (Signed)
Decision today to treat with OMT was based on Physical Exam  After verbal consent patient was treated with HVLA, ME, FPR techniques in cervical, thoracic, rib lumbar and sacral areas  Patient tolerated the procedure well with improvement in symptoms  Patient given exercises, stretches and lifestyle modifications  See medications in patient instructions if given  Patient will follow up in 8 weeks 

## 2018-11-21 NOTE — Patient Instructions (Signed)
Good to see you  See me again in 2 months  I am SO happy for you!

## 2018-11-24 DIAGNOSIS — D1801 Hemangioma of skin and subcutaneous tissue: Secondary | ICD-10-CM | POA: Diagnosis not present

## 2018-11-24 DIAGNOSIS — D485 Neoplasm of uncertain behavior of skin: Secondary | ICD-10-CM | POA: Diagnosis not present

## 2018-11-24 DIAGNOSIS — L570 Actinic keratosis: Secondary | ICD-10-CM | POA: Diagnosis not present

## 2018-11-24 DIAGNOSIS — Z808 Family history of malignant neoplasm of other organs or systems: Secondary | ICD-10-CM | POA: Diagnosis not present

## 2018-11-24 DIAGNOSIS — C44319 Basal cell carcinoma of skin of other parts of face: Secondary | ICD-10-CM | POA: Diagnosis not present

## 2018-11-24 DIAGNOSIS — Z23 Encounter for immunization: Secondary | ICD-10-CM | POA: Diagnosis not present

## 2018-11-24 DIAGNOSIS — Z85828 Personal history of other malignant neoplasm of skin: Secondary | ICD-10-CM | POA: Diagnosis not present

## 2018-11-24 DIAGNOSIS — D225 Melanocytic nevi of trunk: Secondary | ICD-10-CM | POA: Diagnosis not present

## 2018-12-01 DIAGNOSIS — J029 Acute pharyngitis, unspecified: Secondary | ICD-10-CM | POA: Diagnosis not present

## 2018-12-01 DIAGNOSIS — J069 Acute upper respiratory infection, unspecified: Secondary | ICD-10-CM | POA: Diagnosis not present

## 2018-12-22 ENCOUNTER — Ambulatory Visit: Payer: PPO | Admitting: Sports Medicine

## 2018-12-22 ENCOUNTER — Encounter: Payer: Self-pay | Admitting: Sports Medicine

## 2018-12-22 VITALS — BP 102/60 | HR 63 | Ht 66.0 in | Wt 122.6 lb

## 2018-12-22 DIAGNOSIS — M542 Cervicalgia: Secondary | ICD-10-CM | POA: Diagnosis not present

## 2018-12-22 DIAGNOSIS — M9903 Segmental and somatic dysfunction of lumbar region: Secondary | ICD-10-CM

## 2018-12-22 DIAGNOSIS — M545 Low back pain, unspecified: Secondary | ICD-10-CM

## 2018-12-22 DIAGNOSIS — G8929 Other chronic pain: Secondary | ICD-10-CM

## 2018-12-22 DIAGNOSIS — M9902 Segmental and somatic dysfunction of thoracic region: Secondary | ICD-10-CM | POA: Diagnosis not present

## 2018-12-22 DIAGNOSIS — M9908 Segmental and somatic dysfunction of rib cage: Secondary | ICD-10-CM | POA: Diagnosis not present

## 2018-12-22 DIAGNOSIS — M9904 Segmental and somatic dysfunction of sacral region: Secondary | ICD-10-CM | POA: Diagnosis not present

## 2018-12-22 DIAGNOSIS — M999 Biomechanical lesion, unspecified: Secondary | ICD-10-CM | POA: Diagnosis not present

## 2018-12-22 DIAGNOSIS — M9901 Segmental and somatic dysfunction of cervical region: Secondary | ICD-10-CM

## 2018-12-22 DIAGNOSIS — M9905 Segmental and somatic dysfunction of pelvic region: Secondary | ICD-10-CM | POA: Diagnosis not present

## 2018-12-22 NOTE — Patient Instructions (Signed)
Please perform the exercise program that we have prepared for you and gone over in detail on a daily basis.  In addition to the handout you were provided you can access your program through: [email or web address]   Your unique program code is:  [email or web address]

## 2018-12-22 NOTE — Progress Notes (Signed)
Whitney [name]. 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 Neck - Follow-up    Pt of Dr. [name]. Has responded well to OMT in the past. Provided with HEP per last note with Dr. [name].   . Lower Back - Follow-up    SI joint pain. XR L-spine and sacrum 12/13/17.     HPI: Patient is here for the above issues.  She has been seen by Dr. [name] is a long-term patient and has vast experience with osteopathic manipulation she was a functional medicine physician in town.  She has been having worsening neck and upper back pain after a workout 1 week ago.  The tightness is worse in her neck on her right side.  Pain does radiate with deep breathing into her right arm and shoulder.  Pain is worse with cervical sidebending and rotation.  She is tried Percocet topical Voltaren IcyHot and has been performing a home therapeutic exercise program on a regular basis.  She does have history of breast cancer and is status post bilateral mastectomy.  REVIEW OF SYSTEMS: She is having nighttime awakenings due to this issue Denies fevers, chills, recent weight gain or weight loss.  No night sweats.  Pt denies any change in bowel or bladder habits, muscle weakness, numbness or falls associated with this pain.  HISTORY:  Prior history reviewed and updated per electronic medical record.  Patient Active Problem List   Diagnosis Date Noted  . Patellofemoral arthritis of right knee 02/28/2018  . Palpitations 02/10/2018  . Abnormal finding on EKG 02/10/2018  . Breast cancer in situ 09/06/2017  . 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 Malignant neoplasm of upper-outer quadrant of left breast in female, estrogen receptor positive ([place])  08/17/2017  . Low back pain 12/02/2016  . Left hamstring injury 11/01/2013  . Nonallopathic lesion of cervical region 11/01/2013  . Nonallopathic lesion of lumbosacral region 11/01/2013    XR L-spine 12/13/17 IMPRESSION: Grade 1 anterolisthesis of L4 with respect L5 likely on the basis of mild degenerative disc disease. No pars defect is observed. There is minimal increased slippage when the patient moves from the extended or neutral positions to the flexed position. No significant abnormalities are observed at the other lumbar Levels.  XR sacrum 12/13/2017 IMPRESSION: Negative.   . Nonallopathic lesion of thoracic region 11/01/2013   Social History   Occupational History  . Not on file  Tobacco Use  . Smoking status: Former Smoker    Packs/day: 1.00    Years: 10.00    Pack years: 10.00    Types: Cigarettes    Last attempt to quit: 1991    Years since quitting: 29.1  . Smokeless tobacco: Never Used  Substance and Sexual Activity  . Alcohol use: Yes    Comment: rarely  . Drug use: No  . 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   She lives alone.  Is divorced.  She has 2 cats that live with her.   She quit smoking in  1991.  She rarely drinks alcohol.   She is quite active.  Was a former runner.  But she is scared to run again since her black mold.  She does walking, yoga as well as vibration platform for at least 30 minutes at a time 4-5 days a week.    OBJECTIVE:  VS:  HT:5\' 6"  (167.6 cm)   WT:122 lb 9.6 oz (55.6 kg)  BMI:19.8    BP:102/60  HR:63bpm  TEMP: ( )  RESP:96 %   PHYSICAL EXAM: Adult female. No acute distress.  Alert and appropriate. Good cervical and lumbar range of motion although there are functional limitations. Negative Spurling's compression test and Lhermitte's  compression test.   Upper extremity lower extremity strength is 5/5 in all myotomes. Normal sensation Significant anterior chain dominant posture.    ASSESSMENT:  1. Neck pain   2. Nonallopathic lesion of lumbosacral region   3. Chronic bilateral low back pain without sciatica   4. Somatic dysfunction of cervical region   5. Somatic dysfunction of thoracic region   6. Somatic dysfunction of lumbar region   7. Somatic dysfunction of rib cage region   8. Somatic dysfunction of pelvis region   9. Somatic dysfunction of sacral region     PROCEDURES:  PROCEDURE NOTE: OSTEOPATHIC MANIPULATION  The decision today to treat with Osteopathic Manipulative Therapy (OMT) was based on physical exam findings. Verbal consent was obtained following a discussion with the patient regarding the of risks, benefits and potential side effects, including an acute pain flare,post manipulation soreness and need for repeat treatments. Additionally, we specifically discussed the minimal risk of  injury to neurovascular structures associated with Cervical manipulation. Contraindications to OMT: NONE Manipulation was performed as below: Regions Treated & Osteopathic Exam Findings CERVICAL SPINE: OA - rotated right THORACIC SPINE:  T2 - 4 Neutral, rotated RIGHT, sidebent LEFT T6 - 10 Neutral, rotated LEFT, sidebent RIGHT RIBS:  Rib 5 Right  Posterior LUMBAR SPINE:  L3 FRS RIGHT (Flexed, Rotated & Sidebent) PELVIS:  Right psoas spasm Right anterior innonimate SACRUM:  L on L sacral torsion  OMT Techniques Used: HVLA muscle energy myofascial release  The patient tolerated the treatment well and reported Improved symptoms following treatment today. Patient was given medications, exercises, stretches and lifestyle modifications per AVS and verbally.   PROCEDURE NOTE: THERAPEUTIC EXERCISES (97110) 15 minutes spent for Therapeutic exercises as below and as referenced in the AVS.  This included exercises  focusing on stretching, strengthening, with significant focus on eccentric aspects.   Proper technique shown and discussed handout in great detail with ATC.  All questions were discussed and answered.   Long term goals include an improvement in range of motion, strength, endurance as well as avoiding reinjury. Frequency of visits is one time as determined during today's  office visit. Frequency of exercises to be performed is as per handout.  EXERCISES REVIEWED: Cervical Towel Stretching Exercises      PLAN:  Pertinent additional documentation may be included in corresponding procedure notes, imaging studies, problem based documentation and patient instructions.  No problem-specific Assessment & Plan notes found for this encounter.   Patient did well with soft tissue focus OMT today.  She will plan to follow-up with me for repeat manipulation in 2 weeks.  Continue previously prescribed home exercise program.   Osteopathic manipulation was performed today based on physical exam findings.  Patient has responded well to osteopathic manipulation previously the prior manipulation did not provide permanent long lasting relief.  The patient does feel as though there was significant benefit to the prior manipulation and they wished for repeat manipulation today.  They understand that home therapeutic exercises are critical part of the healing/treatment process and will continue with self treatment between now and their next visit as outlined.  The patient understands that the frequency of visits is meant to provide a stimulus to promote the body's own ability to heal and is not meant to be the sole means for improvement in their symptoms.  Activity modifications and the importance of avoiding exacerbating activities (limiting pain to no more than a 4 / 10 during or following activity) recommended and discussed.  Discussed red flag symptoms that warrant earlier emergent evaluation and patient voices  understanding.   No orders of the defined types were placed in this encounter.  Lab Orders  No laboratory test(s) ordered today   Imaging Orders  No imaging studies ordered today   Referral Orders  No referral(s) requested today    At follow up will plan to consider: repeat osteopathic manipulation  Return in about 2 weeks (around 01/05/2019) for consideration of repeat Osteopathic Manipulation.          Gerda Diss, [facility] Physician

## 2018-12-26 ENCOUNTER — Ambulatory Visit: Payer: PPO | Admitting: Family Medicine

## 2019-01-05 ENCOUNTER — Encounter: Payer: Self-pay | Admitting: Sports Medicine

## 2019-01-05 ENCOUNTER — Ambulatory Visit: Payer: PPO | Admitting: Sports Medicine

## 2019-01-05 VITALS — BP 100/54 | HR 59 | Ht 66.0 in | Wt 123.0 lb

## 2019-01-05 DIAGNOSIS — M9901 Segmental and somatic dysfunction of cervical region: Secondary | ICD-10-CM

## 2019-01-05 DIAGNOSIS — M9905 Segmental and somatic dysfunction of pelvic region: Secondary | ICD-10-CM

## 2019-01-05 DIAGNOSIS — M9902 Segmental and somatic dysfunction of thoracic region: Secondary | ICD-10-CM

## 2019-01-05 DIAGNOSIS — M542 Cervicalgia: Secondary | ICD-10-CM

## 2019-01-05 DIAGNOSIS — M9904 Segmental and somatic dysfunction of sacral region: Secondary | ICD-10-CM | POA: Diagnosis not present

## 2019-01-05 DIAGNOSIS — M25552 Pain in left hip: Secondary | ICD-10-CM

## 2019-01-05 DIAGNOSIS — M9903 Segmental and somatic dysfunction of lumbar region: Secondary | ICD-10-CM | POA: Diagnosis not present

## 2019-01-05 NOTE — Progress Notes (Signed)
Juanda Bond. 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 Follow-up    Neck and back pain.  Towel stretches for neck.  OMT.    HPI: Patient is here for neck back and left hip pain.  Her neck and back are improved however the left hip is continuing to be somewhat uncomfortable.  Does radiate into her [place] from her lower back.  She is having crepitation with C-spine mobility.  She takes occasional Percocet as well as topical Voltaren.  She is continue to perform home therapeutic exercises previously prescribed to myself as well as Dr. [name].  She has an upcoming [facility] in [facility].  She has previously done foundations training but it has been quite some time.  REVIEW OF SYSTEMS: No significant nighttime awakenings due to this issue. Denies fevers, chills, recent weight gain or weight loss.  No night sweats.  Pt denies any change in bowel or bladder habits, muscle weakness, numbness or falls associated with this pain.  HISTORY:  Prior history reviewed and updated per electronic medical record.  Patient Active Problem List   Diagnosis Date Noted  . Patellofemoral arthritis of right knee 02/28/2018  . Palpitations 02/10/2018  . Abnormal finding on EKG 02/10/2018  . Breast cancer in situ 09/06/2017  . 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 Malignant neoplasm of upper-outer quadrant of left breast in female, estrogen receptor positive ([place]) 08/17/2017  . Low back pain 12/02/2016  . Left hamstring injury 11/01/2013  . Nonallopathic lesion of cervical region 11/01/2013  . Nonallopathic lesion of lumbosacral region 11/01/2013    XR L-spine 12/13/17 IMPRESSION: Grade 1 anterolisthesis of L4 with respect L5  likely on the basis of mild degenerative disc disease. No pars defect is observed. There is minimal increased slippage when the patient moves from the extended or neutral positions to the flexed position. No significant abnormalities are observed at the other lumbar Levels.  XR sacrum 12/13/2017 IMPRESSION: Negative.   . Nonallopathic lesion of thoracic region 11/01/2013   Social History   Occupational History  . Not on file  Tobacco Use  . Smoking status: Former Smoker    Packs/day: 1.00    Years: 10.00    Pack years: 10.00    Types: Cigarettes    Last attempt to quit: 1991    Years since quitting: 29.1  . Smokeless tobacco: Never Used  Substance and Sexual Activity  . Alcohol use: Yes    Comment: rarely  . Drug use: No  . 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   She lives alone.  Is divorced.  She has 2 cats that live with her.   She quit smoking in 1991.  She rarely drinks alcohol.   She is quite active.  Was a former runner.  But she is scared to run again since her black mold.  She does walking, yoga as well as vibration platform for at least 30 minutes at a time 4-5 days a week.   OBJECTIVE:  VS:  HT:5\' 6"  (167.6 cm)   WT:123 lb (55.8 kg)  BMI:19.86    BP:(!) 100/54  HR:(!) 59bpm  TEMP: ( )  RESP:97 %   PHYSICAL EXAM: Adult female. No acute distress.  Alert and appropriate. Good cervical and lumbar range of motion although there are functional limitations. Negative Spurling's compression test and Lhermitte's compression test.   Upper extremity lower extremity strength is 5/5 in all myotomes. Normal sensation Significant anterior chain dominant posture.    ASSESSMENT:   1. Neck pain   2. Somatic dysfunction of cervical region   3. Somatic dysfunction of thoracic region     4. Somatic dysfunction of lumbar region   5. Somatic dysfunction of pelvis region   6. Somatic dysfunction of sacral region   7. Pain of left hip joint     PROCEDURES:  PROCEDURE NOTE : OSTEOPATHIC MANIPULATION The decision today to treat with Osteopathic Manipulative Therapy (OMT) was based on physical exam findings. Verbal consent was obtained following a discussion with the patient regarding the of risks, benefits and potential side effects, including an acute pain flare,post manipulation soreness and need for repeat treatments. Minimal risk of injury to neurovascular structures with cervical manipulation previously discussed in detailed and reaffirmed today.   Contraindications to OMT: NONE  Manipulation was performed as below: Regions Treated & Osteopathic Exam Findings  CERVICAL SPINE: OA - rotated right C4 - 6 Flexed, rotated LEFT, sidebent RIGHT THORACIC SPINE:  T4 - 8 Neutral, rotated RIGHT, sidebent LEFT LUMBAR SPINE:  L3 FRS RIGHT (Flexed, Rotated & Sidebent) PELVIS:  Left psoas spasm Left anterior innonimate SACRUM:  R on R sacral torsion   OMT Techniques Used  HVLA muscle energy myofascial release facilitated positional release HVLA - Long Lever    The patient tolerated the treatment well and reported Improved symptoms following treatment today. Patient was given medications, exercises, stretches and lifestyle modifications per AVS and verbally.     PLAN:  Pertinent additional documentation may be included in corresponding procedure notes, imaging studies, problem based documentation and patient instructions.  No problem-specific Assessment & Plan notes found for this encounter.   She is responded well to osteopathic manipulation especially with muscle energy.  We will plan to her follow-up next week prior to her 5K and have her begin working on therapeutic exercises as reviewed per instructions.  Continue previously prescribed home exercise program.    Discussed the underlying features of tight hip flexors leading to crouched, fetal like position that results in spinal column compression.  Including lumbar hyperflexion with hypermobility, thoracic flexion with restrictive rotation and cervical lordosis reversal.   Osteopathic manipulation was performed today based on physical exam findings.  Patient has responded well to osteopathic manipulation previously the prior manipulation did not provide permanent long lasting relief.  The patient does feel as though there was significant benefit to the prior manipulation and they wished for repeat manipulation today.  They understand that home therapeutic exercises are critical part of the healing/treatment process and will continue with self treatment between now and their next visit as outlined.  The patient understands that the frequency of visits is meant to provide a stimulus to promote the body's own ability to heal and is not meant to be the sole means for improvement in their symptoms.  Activity modifications and the importance of avoiding exacerbating activities (limiting pain to no more than a 4 / 10 during or following activity) recommended and discussed.  Discussed red flag symptoms that warrant earlier  emergent evaluation and patient voices understanding.   No orders of the defined types were placed in this encounter.  Lab Orders  No laboratory test(s) ordered today   Imaging Orders  No imaging studies ordered today   Referral Orders  No referral(s) requested today    At follow up will plan to consider: repeat osteopathic manipulation  Return in about 1 week (around 01/12/2019) for consideration of repeat Osteopathic Manipulation.          Gerda Diss, [facility] Physician

## 2019-01-08 ENCOUNTER — Encounter: Payer: Self-pay | Admitting: Sports Medicine

## 2019-01-12 ENCOUNTER — Encounter: Payer: Self-pay | Admitting: Sports Medicine

## 2019-01-12 ENCOUNTER — Ambulatory Visit: Payer: PPO | Admitting: Sports Medicine

## 2019-01-12 VITALS — BP 108/56 | HR 54 | Wt 120.2 lb

## 2019-01-12 DIAGNOSIS — M9904 Segmental and somatic dysfunction of sacral region: Secondary | ICD-10-CM

## 2019-01-12 DIAGNOSIS — M9905 Segmental and somatic dysfunction of pelvic region: Secondary | ICD-10-CM

## 2019-01-12 DIAGNOSIS — M9901 Segmental and somatic dysfunction of cervical region: Secondary | ICD-10-CM

## 2019-01-12 DIAGNOSIS — M9903 Segmental and somatic dysfunction of lumbar region: Secondary | ICD-10-CM

## 2019-01-12 DIAGNOSIS — M542 Cervicalgia: Secondary | ICD-10-CM | POA: Diagnosis not present

## 2019-01-12 DIAGNOSIS — M25552 Pain in left hip: Secondary | ICD-10-CM | POA: Diagnosis not present

## 2019-01-12 NOTE — Patient Instructions (Signed)
[facility] at [place]  [phone] is the phone number  Address is [address]

## 2019-01-12 NOTE — Progress Notes (Signed)
Whitney [name]. 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 Follow-up    back pain with left hip and ankle pain.     HPI: Here for follow-up of left hip and neck pain.  Overall her neck is doing better.  She is responded well to the osteopathic manipulation and has noted that her low back pain has been evolving and is now localized more over the left gluteal region and ITB region.  She has had good preparation for upcoming 5K this weekend but does have some worsening left ankle pain especially after 2 and half mile run.  She is using heat, ice, elevation, bracing, Voltaren gel, intermittent Percocet and performing home therapeutic exercises for her neck.  She denies any radicular symptoms.  REVIEW OF SYSTEMS: Per HPI  HISTORY:  Prior history reviewed and updated per electronic medical record.  Patient Active Problem List   Diagnosis Date Noted  . Patellofemoral arthritis of right knee 02/28/2018  . Palpitations 02/10/2018  . Abnormal finding on EKG 02/10/2018  . Breast cancer in situ 09/06/2017  . 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 Malignant neoplasm of upper-outer quadrant of left breast in female, estrogen receptor positive ([place]) 08/17/2017  . Low back pain 12/02/2016  . Left hamstring injury 11/01/2013  . Nonallopathic lesion of cervical region 11/01/2013  . Nonallopathic lesion of lumbosacral region 11/01/2013    XR L-spine 12/13/17 IMPRESSION: Grade 1 anterolisthesis of L4 with respect L5 likely on the basis of mild degenerative disc disease. No pars defect is observed. There is minimal increased slippage when the patient moves from the extended or neutral positions to the flexed  position. No significant abnormalities are observed at the other lumbar Levels.  XR sacrum 12/13/2017 IMPRESSION: Negative.   . Nonallopathic lesion of thoracic region 11/01/2013   Social History   Occupational History  . Not on file  Tobacco Use  . Smoking status: Former Smoker    Packs/day: 1.00    Years: 10.00    Pack years: 10.00    Types: Cigarettes    Last attempt to quit: 1991    Years since quitting: 29.2  . Smokeless tobacco: Never Used  Substance and Sexual Activity  . Alcohol use: Yes    Comment: rarely  . Drug use: No  . 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   She lives alone.  Is divorced.  She has 2 cats that live with her.   She quit smoking in 1991.  She rarely drinks alcohol.   She is quite active.  Was a former runner.  But she is scared to run again since her black mold.  She does walking, yoga as well as vibration platform for at least 30 minutes at a time 4-5 days a week.    OBJECTIVE:  VS:  HT:    WT:120 lb 3.2 oz (54.5 kg)  BMI:19.41    BP:(!) 108/56  HR:(!) 54bpm  TEMP: ( )  RESP:99 %   PHYSICAL EXAM: Adult female.  No acute distress.  Alert and appropriate. Marked anterior chain dominance with left-sided hip flexor contracture compared to the right with marked focal trigger point over the left TFL and gluteal region.  She has good internal and external rotation of bilateral hips and her ankles have good range of motion but she does have some crepitation with a inverted subtalar joint on the left.   ASSESSMENT:   1. Somatic dysfunction of cervical region   2. Neck pain   3. Somatic dysfunction of lumbar region   4. Somatic dysfunction of pelvis region   5. Somatic dysfunction of sacral region   6. Pain of left hip joint     PROCEDURES:  PROCEDURE NOTE:  OSTEOPATHIC MANIPULATION   The decision today to treat with Osteopathic Manipulative Therapy (OMT) was based on physical exam findings. Verbal consent was obtained following a discussion with the patient regarding the of risks, benefits and potential side effects, including an acute pain flare,post manipulation soreness and need for repeat treatments.  NONE  Manipulation was performed as below:  Regions Treated & Osteopathic Exam Findings   CERVICAL SPINE:  OA - rotated right C4 Extended, rotated LEFT, sidebent RIGHT THORACIC SPINE:   T2 - 8 Neutral, rotated RIGHT, sidebent LEFT RIBS:   Rib 6 Right  Posterior LUMBAR SPINE:   L4 FRS LEFT (Flexed, Rotated & Sidebent) PELVIS:   Left psoas spasm Left anterior innonimate    OMT Techniques Used:  HVLA muscle energy myofascial release    The patient tolerated the treatment well and reported Improved symptoms following treatment today. Patient was given medications, exercises, stretches and lifestyle modifications per AVS and verbally.     PLAN:  Pertinent additional documentation may be included in corresponding procedure notes, imaging studies, problem based documentation and patient instructions.  No problem-specific Assessment & Plan notes found for this encounter.   Continue previously prescribed home exercise program.   We also discussed the options for medical acupuncture/dry needling.  She will continue this with [facility] acupuncture.  Osteopathic manipulation was performed today based on physical exam findings.  Patient has responded well to osteopathic manipulation previously the prior manipulation did not provide permanent long lasting relief.  The patient does feel as though there was significant benefit to the prior manipulation and they wished for repeat manipulation today.  They understand that home therapeutic exercises are critical part of the healing/treatment process and will continue with self treatment between now and  their next visit as outlined.  The patient understands that the frequency of visits is meant to provide a stimulus to promote the body's own ability to heal and is not meant to be the sole means for improvement in their symptoms.  Activity modifications and the importance of avoiding exacerbating activities (limiting pain to no more than a 4 / 10 during or following activity) recommended and discussed.  Discussed red flag symptoms that warrant earlier emergent evaluation and patient voices understanding.   No orders of the defined types were placed in this encounter.  Lab Orders  No laboratory test(s) ordered today   Imaging Orders  No imaging studies ordered today   Referral Orders  No referral(s) requested today    Return in about 2 weeks (around 01/26/2019) for consideration of repeat Osteopathic Manipulation.          Gerda Diss, [place] [facility] Physician

## 2019-01-16 DIAGNOSIS — D485 Neoplasm of uncertain behavior of skin: Secondary | ICD-10-CM | POA: Diagnosis not present

## 2019-01-16 DIAGNOSIS — C44311 Basal cell carcinoma of skin of nose: Secondary | ICD-10-CM | POA: Diagnosis not present

## 2019-01-16 DIAGNOSIS — D2239 Melanocytic nevi of other parts of face: Secondary | ICD-10-CM | POA: Diagnosis not present

## 2019-01-23 ENCOUNTER — Ambulatory Visit: Payer: PPO | Admitting: Family Medicine

## 2019-01-24 DIAGNOSIS — N83202 Unspecified ovarian cyst, left side: Secondary | ICD-10-CM | POA: Diagnosis not present

## 2019-01-24 DIAGNOSIS — R970 Elevated carcinoembryonic antigen [CEA]: Secondary | ICD-10-CM | POA: Diagnosis not present

## 2019-01-26 ENCOUNTER — Ambulatory Visit: Payer: PPO | Admitting: Sports Medicine

## 2019-02-06 ENCOUNTER — Telehealth: Payer: Self-pay | Admitting: Genetic Counselor

## 2019-02-06 ENCOUNTER — Encounter: Payer: Self-pay | Admitting: Genetic Counselor

## 2019-02-06 NOTE — Telephone Encounter (Signed)
A genetic counseling appt has been scheduled for the pt to see Whitney [name] on 6/15 at 9am. Letter mailed.

## 2019-02-08 ENCOUNTER — Ambulatory Visit: Payer: PPO | Admitting: Sports Medicine

## 2019-03-02 ENCOUNTER — Telehealth: Payer: Self-pay

## 2019-03-02 NOTE — Telephone Encounter (Signed)
Called patient and left message on voicemail to cancel appointment 04/27 and to reschedule for an office visit in September due to covid 19.

## 2019-03-06 ENCOUNTER — Telehealth: Payer: PPO | Admitting: Cardiology

## 2019-03-30 DIAGNOSIS — E782 Mixed hyperlipidemia: Secondary | ICD-10-CM | POA: Diagnosis not present

## 2019-03-30 DIAGNOSIS — E039 Hypothyroidism, unspecified: Secondary | ICD-10-CM | POA: Diagnosis not present

## 2019-03-30 DIAGNOSIS — Z5181 Encounter for therapeutic drug level monitoring: Secondary | ICD-10-CM | POA: Diagnosis not present

## 2019-04-19 ENCOUNTER — Telehealth: Payer: Self-pay | Admitting: Genetic Counselor

## 2019-04-19 NOTE — Telephone Encounter (Signed)
Called patient regarding upcoming [facility] appointment, per patient's request this will be a walk-in visit. °

## 2019-04-24 ENCOUNTER — Other Ambulatory Visit: Payer: PPO

## 2019-04-24 ENCOUNTER — Other Ambulatory Visit: Payer: Self-pay

## 2019-04-24 ENCOUNTER — Encounter: Payer: Self-pay | Admitting: Genetic Counselor

## 2019-04-24 ENCOUNTER — Inpatient Hospital Stay: Payer: PPO | Attending: Genetic Counselor | Admitting: Genetic Counselor

## 2019-04-24 DIAGNOSIS — Z803 Family history of malignant neoplasm of breast: Secondary | ICD-10-CM | POA: Diagnosis not present

## 2019-04-24 DIAGNOSIS — Z800 Family history of malignant neoplasm of digestive organs: Secondary | ICD-10-CM | POA: Insufficient documentation

## 2019-04-24 DIAGNOSIS — D0512 Intraductal carcinoma in situ of left breast: Secondary | ICD-10-CM

## 2019-04-24 DIAGNOSIS — Z808 Family history of malignant neoplasm of other organs or systems: Secondary | ICD-10-CM | POA: Insufficient documentation

## 2019-04-24 NOTE — Progress Notes (Signed)
GENETIC TEST RESULTS   Patient Name: Whitney Sails, Whitney [name] Patient Age: 68 y.o. 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 Please refer to the prior Genetics clinic note for more information regarding Dr. Donia Guiles medical and family histories and our assessment at the time.   FAMILY HISTORY:  We obtained a detailed, 4-generation family history.  Significant diagnoses are listed below: Family History  Problem Relation Age of Onset   Hyperlipidemia Mother    Breast cancer Mother        bilateral mastectomy   Melanoma Mother 81   Hyperlipidemia Father    Prostate cancer Father 67   Lung cancer Father    Birth defects Maternal Grandmother    Breast cancer Maternal Grandmother        dx in her 109s   Birth defects Paternal Grandmother    Congestive Heart Failure Paternal Grandmother    Heart disease Sister        short QT   Other Sister        ATM+   Lung cancer Maternal Grandfather    Heart attack Paternal Grandfather 4   Cancer Sister        Anal cancer   Other Sister        ATM-   Healthy Sister    Other Maternal Aunt        double mastectomy - unsure if had breast cancer   Breast cancer Other    Pancreatic cancer Other 100    The patient does not have children.  She has two sisters and a brother.  One sister had Anal cancer at 75.   This sister reportedly tested negative for ATM, she is not sure about RAD50.  The other sister is healthy, but tested positive for ATM, she is unsure about RAD50.  The patient's brother has not had testing.  Both parents are deceased.  The patient's mother was diagnosed with melanoma at 105, and possibly had breast cancer.  The patient states that she thought her mother had a double mastectomy due to  fibrocystic breast disease, but her sister reports that her mother had breast cancer.  Her mother had one sister who also had a double mastectomy, but it unclear whether she had breast cancer.  The maternal grandparents are deceased.  The grandfather had lung cancer and the grandmother had breast cancer.  The grandmother's sister also had breast cancer and her mother had pancreatic cancer.  The patient's father had prostate cancer in his 41's.  He had three boys who are all cancer free.  The paternal grandparents are both deceased from heart disease.  Patient's maternal ancestors are of [place] descent, and paternal ancestors are of Northern European descent. There is no reported Ashkenazi Jewish ancestry. There is no known consanguinity.    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 There were no deleterious mutations in AIP, ALK, APC, AXIN2,BAP1,  BARD1, BLM, BMPR1A, BRCA1, BRCA2, BRIP1, CASR, CDC73, CDH1, CDK4, CDKN1B, CDKN1C, CDKN2A (p14ARF), CDKN2A (  p16INK4a), CEBPA, CHEK2, CTNNA1, DICER1, DIS3L2, EGFR (c.2369C>T, p.Thr790Met variant only), EPCAM (Deletion/duplication testing only), FH, FLCN, GATA2, GPC3, GREM1 (Promoter region deletion/duplication testing only), HOXB13 (c.251G>A, p.Gly84Glu), HRAS, KIT, MAX, MEN1, MET, MITF (c.952G>A, p.Glu318Lys variant only), MLH1, MSH2, MSH3, MSH6, MUTYH, NBN, NF1, NF2, NTHL1, PALB2, PDGFRA, PHOX2B, PMS2, POLD1, POLE, POT1, PRKAR1A, PTCH1, PTEN, RAD51C, RAD51D, RB1, RECQL4, RET, RUNX1, SDHAF2, SDHA (sequence changes only), SDHB, SDHC, SDHD, SMAD4, SMARCA4, SMARCB1, SMARCE1, STK11, SUFU, TERC, TERT, TMEM127,  TP53, TSC1, TSC2, VHL, WRN and WT1.   Genetic testing did identify a variant of uncertain significance (VUS) was identified in the CTNNA1 gene called c.2558A>G.  At this time, it is unknown if this variant is associated with increased cancer risk or if this is a normal finding, but most variants such as this get reclassified to being inconsequential. It should not be used to make medical management decisions. With time, we suspect the lab will determine the significance of this variant, if any. If we do learn more about it, we will try to contact DrDeirdre Pippins to discuss it further. However, it is important to stay in touch with [place] periodically and keep the address and phone number up to date.    DISCUSSION:  The patient tested positive for two hereditary pathogenic/likely pathogenic gene mutations.  These will be discussed individually.  ATM: The ATM gene is involved in the detection and surveillance of DNA damage.  ATM phosphorylation of BRCA1 is critical for proper response to DNA double-strand breaks.  This is believed to be the reason for the role ATM has in breast cancer risk.  Individuals with a ATM mutation are at a greater risk for having children with Ataxia-telangiectasia (A-T).  AT is characterized by progressive cerebellar degeneration (ataxia), dilated blood vessels in the eyes and skin (telangiectasia), immunodeficiency, chromosomal instability, increased sensitivity to ionizing radiation and a predisposition to lymphoma and leukemia.  Therefore, individuals of childbearing age who have a known ATM mutation may want to consider having their spouse tested to determine their risk for having a child with A-T.  Studies of these families demonstrated increased incident of breast cancer in the mothers (who are heterozygous carriers) of the affected children, thus prompting further evaluation of the relationship between breast cancer and ATM.  Women who are heterozygous ATM carriers have an  increase breast cancer risk.  They have 5-fold increased risk of breast cancer <50 years, and 2-3 fold increased risk for breast cancer overall.  In families with familial breast cancer that were negative for BRCA1 or BRCA2 genes, approximately 2.7% of women were found to have one ATM mutation.  In families with both breast cancer and leukemia, 6.7% of women were found to have one ATM mutation.  There has been some evidence that radiation treatment may increase the risk for breast cancer in the contralateral breast. Despite this risk, we do not recommend declining radiation treatment for her breast cancer if it is recommended, as the risk for having a recurrence of breast cancer based on not going through radiation may be greater than her risk for getting breast cancer again from the radiation.   Management for individuals with ATM mutations can be found in the NCCN guidelines (v.1.2020).  These guidelines recommend the following:  Breast Cancer     Screening: Annual mammogram with consideration of tomosynthesis and consider breast MRI with contrast starting at age 39 years.  Risk Reducing Mastectomy: Evidence insufficient, consider based on family history.  The patient has had a double  mastectomy after her original diagnosis of breast cancer.  Ovarian Cancer  Potential increased risk for ovarian cancer.  However, the evidence is insufficient enough to make recommendations for a risk reducing salpingo-oophorectomy.  This should be managed based on family history.  Other Cancer Risks  Unknown or insufficient evidence for pancreatic or prostate cancer risk  RAD50:   Women with one pathogenic RAD50 variant may have an increased risk of breast cancer, although exact risk figures have yet to be determined. Data also suggest RAD50 may be associated with ovarian cancer; however, these data are also limited. An elevated risk for other cancers has been considered, but available evidence is insufficient  to make a determination at this time. While an individual with a RAD50 pathogenic variant will not necessarily develop cancer in her lifetime, her cancer risk may be increased when compared to the general population risk.  Management At this time, there are no published guidelines or recommendations suggesting RAD50-specific medical management; however, due to the associated increased risk of breast cancer in women with a pathogenic RAD50 variant, enhanced or more frequent cancer screening may be warranted. An individuals cancer risk and medical management are not determined by genetic test results alone. Overall cancer risk assessment incorporates additional factors, including personal medical history, family history, and any available genetic information that may result in a personalized plan for cancer prevention and surveillance.  Even though data regarding pathogenic RAD50 variants and the associated risk of breast cancer is limited, knowing if a pathogenic variant is present is advantageous. At-risk relatives can be identified, enabling pursuit of a diagnostic evaluation. Further, the available information regarding hereditary cancer susceptibility genes is constantly evolving and more clinically relevant data regarding RAD50 are likely to become available in the near future. Awareness of this cancer predisposition encourages patients and their providers to inform at-risk family members, to diligently follow standard screening protocols, and to be vigilant in maintaining close and regular contact with their local genetics clinic in anticipation of new information.  Gene information The protein encoded by the ATM gene belongs to the PI3/PI4-kinase family. This protein is an important cell-cycle checkpoint kinase that phosphorylates. It functions as a regulator of a wide variety of downstream proteins, including tumor suppressor proteins p53 and BRCA1, checkpoint kinase CHK2, checkpoint proteins RAD17  and RAD9, and DNA repair protein NBS1. This protein and the closely related kinase ATR are thought to be master controllers of cell cycle checkpoint-signaling pathways that are required for cell response to DNA damage and for genome stability (NCBI Gene. Gene ID: [id number]. [email or web address].ca. Accessed March 2018). If there is a pathogenic variant in this gene that prevents it from functioning normally, the risk of developing certain cancers may be increased.  The RAD50 gene is a component of the MRN complex, which is a protein complex consisting of the MRE11A, RAD50, and NBN genes. This complex plays a central role in double-strand break repair, DNA recombination, and maintenance of telomere integrity and meiosis (PMID: 35009381; UniProtKB - W29937 RAD50_HUMAN; Accessed July 2016. If there is a pathogenic variant in this gene that prevents it from functioning normally, the risk of developing certain types of cancers is increased.  ATM and RAD50 work closely together within the same pathway.  When there is a pathogenic mutation within two genes in the same pathway, it is assumed that the risks are not additive, but instead the greatest risk for cancer is based on the gene that conveys the highest risk.  While we do not  have evidence through literature that this is what is happening with pathogenic mutations within these two genes, based on all the information we have to date, we assume that Ms. Ducksworth's risk for breast cancer would not be higher than her risk conveyed by the ATM mutation.  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  She reports having several SNPs that are associated with an increased risk for breast cancer.  We discussed that there are SNPs that have been associated with incremental risks for breast cancer, and some laboratories offer breast cancer risk assessments based on a proprietary combination of those SNPs.  The testing  performed by [facility] does not provide that risk assessment.  Inheritance Individuals with one pathogenic variant in either ATM and/or RAD50 have a 50% chance of passing that variant on to their offspring. It is now possible to identify relatives who can pursue testing for this specific familial variant. Many cases are inherited from a parent, but some cases occur spontaneously (i.e., an individual with a pathogenic variant has parents who do not have it).  Individuals with a single pathogenic variant in ATM are also carriers of ataxia-telangiectasia (A-T). A-T is an autosomal recessive condition that results when an individual inherits a pathogenic ATM variant from each parent. Features include progressive cerebellar ataxia, oculomotor apraxia, choreoathetosis, telangiectasias of the conjunctivae, immunodeficiency, premature aging, endocrine abnormalities, and increased risk for malignancy--particularly leukemia and lymphoma. For there to be a risk of A-T in offspring, both the patient and his/her partner would each have to carry a pathogenic variant in ATM. In this case, the risk of having have an affected child would be 25%.  The RAD50 gene is also associated with autosomal recessive Nijmegen breakage syndrome-like disorder (NBSLD). NBSLD is characterized by a clinical phenotype resembling Nijmegen breakage syndrome including chromosomal instability, radiosensitivity, neurodevelopmental disease, and immunodeficiency, but with a milder clinical presentation.  FAMILY MEMBERS: It is important that all of Dr. Donia Guiles relatives (both men and women) know of the presence of these gene mutations. Site-specific genetic testing can sort out who in the family is at risk and who is not.   Dr. Donia Guiles siblings have a 50% chance to have inherited each of these mutations. We recommend they have genetic testing for these same mutations, as identifying the presence of these mutations would allow them to also take  advantage of risk-reducing measures. Both sisters have been tested for ATM, one is positive and the other is negative.  Ms. Odom is unsure if they were tested for RAD50. We discussed that her brother should also be tested.  He would not be thought to be at a higher risk for breast cancer, but could be at risk for pancreatic cancer due to ATM.  While he is not at a significantly elevated risk for cancer, he has children who could have inherited this mutation and therefore be at risk for breast cancer.  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 They provide opportunities to speak with other individuals from high-risk families. To locate genetic counselors in other cities, visit the website of the [employer] (ArtistMovie.se) and Secretary/administrator for a Social worker by zip code.  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 Our contact number was provided. Dr. Donia Guiles questions were answered to her satisfaction today, and she knows she is welcome to call anytime with additional questions.   Jaquelinne Glendening P. Florene Glen, [place], [facility] Certified Genetic Counselor [email or web address] phone: [phone]  The patient was seen for a total of 50 minutes in face-to-face genetic counseling.

## 2019-05-04 ENCOUNTER — Other Ambulatory Visit: Payer: Self-pay

## 2019-05-04 DIAGNOSIS — M25552 Pain in left hip: Secondary | ICD-10-CM | POA: Diagnosis not present

## 2019-05-04 DIAGNOSIS — M545 Low back pain, unspecified: Secondary | ICD-10-CM

## 2019-05-04 DIAGNOSIS — S8990XA Unspecified injury of unspecified lower leg, initial encounter: Secondary | ICD-10-CM | POA: Diagnosis not present

## 2019-05-11 DIAGNOSIS — S8990XA Unspecified injury of unspecified lower leg, initial encounter: Secondary | ICD-10-CM | POA: Diagnosis not present

## 2019-05-11 DIAGNOSIS — M25552 Pain in left hip: Secondary | ICD-10-CM | POA: Diagnosis not present

## 2019-05-18 DIAGNOSIS — M25552 Pain in left hip: Secondary | ICD-10-CM | POA: Diagnosis not present

## 2019-05-18 DIAGNOSIS — S8990XA Unspecified injury of unspecified lower leg, initial encounter: Secondary | ICD-10-CM | POA: Diagnosis not present

## 2019-05-25 DIAGNOSIS — S8990XA Unspecified injury of unspecified lower leg, initial encounter: Secondary | ICD-10-CM | POA: Diagnosis not present

## 2019-05-25 DIAGNOSIS — M25552 Pain in left hip: Secondary | ICD-10-CM | POA: Diagnosis not present

## 2019-06-01 DIAGNOSIS — S8990XA Unspecified injury of unspecified lower leg, initial encounter: Secondary | ICD-10-CM | POA: Diagnosis not present

## 2019-06-01 DIAGNOSIS — M25552 Pain in left hip: Secondary | ICD-10-CM | POA: Diagnosis not present

## 2019-06-08 DIAGNOSIS — S8990XA Unspecified injury of unspecified lower leg, initial encounter: Secondary | ICD-10-CM | POA: Diagnosis not present

## 2019-06-08 DIAGNOSIS — M25552 Pain in left hip: Secondary | ICD-10-CM | POA: Diagnosis not present

## 2019-06-15 DIAGNOSIS — S8990XA Unspecified injury of unspecified lower leg, initial encounter: Secondary | ICD-10-CM | POA: Diagnosis not present

## 2019-06-15 DIAGNOSIS — M25552 Pain in left hip: Secondary | ICD-10-CM | POA: Diagnosis not present

## 2019-06-22 DIAGNOSIS — M25552 Pain in left hip: Secondary | ICD-10-CM | POA: Diagnosis not present

## 2019-06-22 DIAGNOSIS — S8990XA Unspecified injury of unspecified lower leg, initial encounter: Secondary | ICD-10-CM | POA: Diagnosis not present

## 2019-06-29 DIAGNOSIS — S8990XA Unspecified injury of unspecified lower leg, initial encounter: Secondary | ICD-10-CM | POA: Diagnosis not present

## 2019-06-29 DIAGNOSIS — M25552 Pain in left hip: Secondary | ICD-10-CM | POA: Diagnosis not present

## 2019-07-13 DIAGNOSIS — M25552 Pain in left hip: Secondary | ICD-10-CM | POA: Diagnosis not present

## 2019-07-13 DIAGNOSIS — S8990XA Unspecified injury of unspecified lower leg, initial encounter: Secondary | ICD-10-CM | POA: Diagnosis not present

## 2019-07-26 DIAGNOSIS — M25552 Pain in left hip: Secondary | ICD-10-CM | POA: Diagnosis not present

## 2019-07-26 DIAGNOSIS — S8990XA Unspecified injury of unspecified lower leg, initial encounter: Secondary | ICD-10-CM | POA: Diagnosis not present

## 2019-10-18 NOTE — Progress Notes (Deleted)
Cardiology Office Note   Date:  10/18/2019   ID:  Whitney Sails, Whitney [name], DOB 02/06/1951, MRN [id number]  PCP:  [name], Whitney [name]  Cardiologist: Dr. Ellyn Hack No chief complaint on file.    History of Present Illness: Whitney Sails, Whitney [name] is a 68 y.o. female who presents for ongoing assessment and management of palpitations, with history of left mastectomy for left-sided breast carcinoma.  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  She also has a history of some issues with black mold infection and has had some residual balance issues since that time.  She was seen last by Dr. Ellyn Hack on 02/08/2018.  On that visit she described as having "spells" during which she felt on and noted that her pulse was irregular, and bigeminy or trigeminy on and off.  She acknowledges that there might have been an erection between oxiconazole and trazodone and therefore she stopped itraconazole for the last couple of weeks prior to being seen.  She noted that the premature beats had improved significantly.  A cardiac monitor was planned to evaluate her heart rhythm, and morphology of irregular heart beat.  Echocardiogram was also planned.  Echocardiogram dated 03/01/2018 revealed normal LV systolic function of [id number] to 65%.  There were no wall motion abnormalities.  There were no valvular abnormalities.  Cardiac monitor revealed normal sinus rhythm and PACs, with very rare PVCs.  There were some atrial couplets and relatively rare atrial runs of 3-8 beats.  There were no recorded symptoms associated with this.  There were no changes in her medication regimen at that time.  Past Medical History:  Diagnosis Date  . Anxiety   . Arthritis   . Asthma    excerise induced  . 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 Depression   . Family history of breast cancer   . Family history of melanoma   . Family history of pancreatic cancer   . Hyperlipidemia   .  Hypothyroidism   . Immune disorder ([place])   . Systemic fungal infection 11/2016   Black mold -on maintenance antifungals.    Past Surgical History:  Procedure Laterality Date  . APPENDECTOMY  1983  . CHOLECYSTECTOMY  1975  . MASTECTOMY W/ SENTINEL NODE BIOPSY Bilateral 09/06/2017   Procedure: LEFT MASTECTOMY WITH LEFT SENTINEL LYMPH NODE BIOPSY AND  RIGHT PROPHYLACTIC MASTECTOMY;  Surgeon: [name], Whitney [name];  Location: [facility];  Service: General;  Laterality: Bilateral;  . TONSILLECTOMY AND ADENOIDECTOMY       Current Outpatient Medications  Medication Sig Dispense Refill  . cholestyramine (QUESTRAN) 4 g packet Patient reports dissolving 2 packet in water.  3  . Multiple Vitamins-Minerals (MULTIVITAMIN WITH MINERALS) tablet Take 1 tablet by mouth daily.    . potassium chloride (MICRO-K) 10 MEQ CR capsule Take 1 capsule (10 mEq total) by mouth daily.    . progesterone (PROMETRIUM) 200 MG capsule Take 200 mg by mouth at bedtime. 400mg     . Succimer (DIMERCAPTOSUCCINIC ACID) CRYS Take 300 mg by mouth 3 (three) times daily. For 3 days every 3 weeks.    Marland Kitchen thyroid (NP THYROID) 120 MG tablet Take 120 mg by mouth daily.     . traZODone (DESYREL) 100 MG tablet Take 1 tablet (100 mg total) by mouth at bedtime. (Patient taking differently: Take 100 mg by mouth at bedtime. Taking 1/2 tablet by mouth at bedtime)     No current  facility-administered medications for this visit.     Allergies:   Sulfonamide derivatives    Social History:  The patient  reports that she quit smoking about 29 years ago. Her smoking use included cigarettes. She has a 10.00 pack-year smoking history. She has never used smokeless tobacco. She reports current alcohol use. She reports that she does not use drugs.   Family History:  The patient's family history includes Birth defects in her maternal grandmother and paternal grandmother; Breast cancer in her maternal grandmother, mother, and another family member; Cancer in  her sister; Congestive Heart Failure in her paternal grandmother; Healthy in her sister; Heart attack (age of onset: 13) in her paternal grandfather; Heart disease in her sister; Hyperlipidemia in her father and mother; Lung cancer in her father and maternal grandfather; Melanoma (age of onset: 61) in her mother; Other in her maternal aunt, sister, and sister; Pancreatic cancer (age of onset: 51) in an other family member; Prostate cancer (age of onset: 27) in her father.    ROS: All other systems are reviewed and negative. Unless otherwise mentioned in H&P    PHYSICAL EXAM: VS:  There were no vitals taken for this visit. , BMI There is no height or weight on file to calculate BMI. GEN: Well nourished, well developed, in no acute distress HEENT: normal Neck: no JVD, carotid bruits, or masses Cardiac: ***RRR; no murmurs, rubs, or gallops,no edema  Respiratory:  Clear to auscultation bilaterally, normal work of breathing GI: soft, nontender, nondistended, + BS MS: no deformity or atrophy Skin: warm and dry, no rash Neuro:  Strength and sensation are intact Psych: euthymic mood, full affect   EKG:  EKG {ACTION; IS/IS [id number] ordered today. The ekg ordered today demonstrates ***   Recent Labs: No results found for requested labs within last 8760 hours.    Lipid Panel No results found for: CHOL, TRIG, HDL, CHOLHDL, VLDL, LDLCALC, LDLDIRECT    Wt Readings from Last 3 Encounters:  01/12/19 120 lb 3.2 oz (54.5 kg)  01/05/19 123 lb (55.8 kg)  12/22/18 122 lb 9.6 oz (55.6 kg)      Other studies Reviewed: Additional studies/ records that were reviewed today include: ***. Review of the above records demonstrates: ***   ASSESSMENT AND PLAN:  1.  ***   Current medicines are reviewed at length with the patient today.    Labs/ tests ordered today include: *** Phill Myron. [place], ANP, AACC   10/18/2019 4:45 PM    [place]  [address] Office [phone] Fax [phone]  Notice: This dictation was prepared with Dragon dictation along with smaller phrase technology. Any transcriptional errors that result from this process are unintentional and may not be corrected upon review.

## 2019-10-19 ENCOUNTER — Ambulatory Visit: Payer: PPO | Admitting: Adult Health

## 2019-11-13 ENCOUNTER — Encounter: Payer: Self-pay | Admitting: Cardiology

## 2019-11-13 ENCOUNTER — Other Ambulatory Visit: Payer: Self-pay

## 2019-11-13 ENCOUNTER — Ambulatory Visit: Payer: PPO | Admitting: Cardiology

## 2019-11-13 VITALS — BP 124/67 | HR 54 | Temp 97.3°F | Ht 66.0 in | Wt 124.8 lb

## 2019-11-13 DIAGNOSIS — Z01810 Encounter for preprocedural cardiovascular examination: Secondary | ICD-10-CM | POA: Diagnosis not present

## 2019-11-13 DIAGNOSIS — E785 Hyperlipidemia, unspecified: Secondary | ICD-10-CM | POA: Insufficient documentation

## 2019-11-13 DIAGNOSIS — R002 Palpitations: Secondary | ICD-10-CM

## 2019-11-13 MED ORDER — EZETIMIBE 10 MG PO TABS: 10.0000 mg | ORAL_TABLET | Freq: Every day | ORAL | 3 refills | Status: DC

## 2019-11-13 NOTE — Progress Notes (Signed)
Primary Care Provider: [name], MD Cardiologist: Glenetta Hew, MD Electrophysiologist:   Clinic Note: Chief Complaint  Patient presents with  . Follow-up    Normal palpitations  . 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  There were a few short atrial runs of 3-8 beats.  She just returned from a trip to [place] doing lots of hiking.  No chest pain or dyspnea.  She was concerned about her cholesterol abnormalities evaluation therapy.  Recent Hospitalizations: None  Reviewed  CV studies:    The following studies were reviewed today: (if available, images/films reviewed: From Epic Chart or Care Everywhere) . 2D Echo April 2019: EF 60 to 75%.  Normal wall motion.  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 She is also a little concerned because she had carotid Dopplers showing that she had 70 year old carotids, does not have a bruit.  Really from a cardiac standpoint she is doing very well.  She is now back active again having, recovered from her black mold infestation.  She does interval training with run-walk as well as cycling.  She has to just came back to the beach where she did a 30+ mile cycling trip against the head wound and did quite well with no significant dyspnea.  She has been a lot more active also with a new significant other relationship.  She only notes rare palpitations where she just  feels a skipped beat here and there but nothing that lasts very long.  She says that she switched her supplementation from magnesium glycine made to magnesium torate, and since doing that she has noted significant improvement in her palpitations.  CV Review of Symptoms (Summary): no chest pain or dyspnea on exertion positive for - irregular heartbeat and palpitations negative for - edema, orthopnea, paroxysmal nocturnal dyspnea, rapid heart rate, shortness of breath or Syncope/near syncope, TIA shows amaurosis fugax, claudication  The patient does not have symptoms concerning for COVID-19 infection (fever, chills, cough, or new shortness of breath).  The patient is practicing social distancing. ++ Masking.  She does go out when she needs to for groceries/shopping.  Working from home.    REVIEWED OF SYSTEMS   A comprehensive ROS was performed. Review of Systems  Constitutional: Negative for malaise/fatigue.  HENT: Negative for congestion and nosebleeds.   Respiratory: Negative for cough, shortness of breath and wheezing.   Cardiovascular: Negative for leg swelling.  Gastrointestinal: Negative for blood in stool, constipation and melena.  Genitourinary: Negative for hematuria.  Musculoskeletal: Negative for falls and joint pain.  Neurological: Negative for dizziness.  Endo/Heme/Allergies: Positive for environmental allergies.  Psychiatric/Behavioral: Negative for memory loss. The patient is not nervous/anxious and does not have insomnia.   All other systems reviewed and are negative.  I have reviewed and (if needed) personally updated the patient's problem list, medications, allergies, past medical and surgical history, social and family history.   PAST  MEDICAL HISTORY   Past Medical History:  Diagnosis Date  . Anxiety   . Arthritis   . Asthma    excerise induced  . 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 Depression   . Family history of breast cancer   . Family  history of melanoma   . Family history of pancreatic cancer   . Hyperlipidemia   . Hypothyroidism   . Immune disorder ([place])   . Systemic fungal infection 11/2016   Black mold -on maintenance antifungals.    PAST SURGICAL HISTORY   Past Surgical History:  Procedure Laterality Date  . APPENDECTOMY  1983  . CHOLECYSTECTOMY  1975  . MASTECTOMY W/ SENTINEL NODE BIOPSY Bilateral 09/06/2017   Procedure: LEFT MASTECTOMY WITH LEFT SENTINEL LYMPH NODE BIOPSY AND  RIGHT PROPHYLACTIC MASTECTOMY;  Surgeon: [name], MD;  Location: [facility];  Service: General;  Laterality: Bilateral;  . TONSILLECTOMY AND ADENOIDECTOMY       MEDICATIONS/ALLERGIES   Current Meds  Medication Sig  . cholestyramine (QUESTRAN) 4 GM/DOSE powder Take 4 g by mouth. PRN  . levothyroxine (SYNTHROID) 75 MCG tablet Take 75 mcg by mouth every morning.  Marland Kitchen liothyronine (CYTOMEL) 25 MCG tablet Take 25 mcg by mouth daily.  . Multiple Vitamins-Minerals (MULTIVITAMIN WITH MINERALS) tablet Take 1 tablet by mouth daily.  . progesterone (PROMETRIUM) 200 MG capsule Take 200 mg by mouth at bedtime. 400mg   . traZODone (DESYREL) 100 MG tablet Take 1 tablet (100 mg total) by mouth at bedtime. (Patient taking differently: Take 100 mg by mouth at bedtime. Taking 1/2 tablet by mouth at bedtime)  . [DISCONTINUED] potassium chloride (MICRO-K) 10 MEQ CR capsule Take 1 capsule (10 mEq total) by mouth daily.   She also takes DHEA and estradiol for menopause.  Allergies  Allergen Reactions  . Sulfonamide Derivatives     REACTION: rash    SOCIAL HISTORY/FAMILY HISTORY   Social History   Tobacco Use  . Smoking status: Former Smoker    Packs/day: 1.00    Years: 10.00    Pack years: 10.00    Types: Cigarettes    Quit date: 1991    Years since quitting: 30.0  . Smokeless tobacco: Never Used  Substance Use Topics  . Alcohol use: Yes    Comment: rarely  . Drug use: No   Social History   Social History Narrative   Former  internal medicine primary care provider.     Now currently runs a nutrition supplement business online.   Now essentially medically retired due to black mold infestation.   She lives alone, but has a new significant other.   She has 2 cats that live with her.   She quit smoking in 1991.  She rarely drinks alcohol.   She is quite active.  Was a former runner.  Now that she is recovered from the black mold issues she is now doing cycling and interval training with run-walk..   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  She is having to do all the bookkeeping etc. as well as management.  Family History family history includes Birth defects in her maternal grandmother and paternal grandmother; Breast cancer in her maternal grandmother, mother, and another family member; Cancer in her sister; Congestive Heart Failure in her paternal grandmother; Healthy in her sister; Heart attack (age of onset: 15) in her paternal grandfather; Heart disease in her sister;  Hyperlipidemia in her father and mother; Lung cancer in her father and maternal grandfather; Melanoma (age of onset: 60) in her mother; Other in her maternal aunt, sister, and sister; Pancreatic cancer (age of onset: 64) in an other family member; Prostate cancer (age of onset: 48) in her father.   OBJCTIVE -PE, EKG, labs   Wt Readings from Last 3 Encounters:  11/13/19 124 lb 12.8 oz (56.6 kg)  01/12/19 120 lb 3.2 oz (54.5 kg)  01/05/19 123 lb (55.8 kg)    Physical Exam: BP 124/67   Pulse (!) 54   Temp (!) 97.3 F (36.3 C)   Ht 5\' 6"  (1.676 m)   Wt 124 lb 12.8 oz (56.6 kg)   SpO2 99%   BMI 20.14 kg/m  Physical Exam  Constitutional: She is oriented to person, place, and time. She appears well-developed and well-nourished. No distress.  Relatively thin, well-groomed and otherwise healthy appearing  HENT:  Head: Normocephalic and atraumatic.  Neck: No hepatojugular reflux and no  JVD present. Carotid bruit is not present.  Cardiovascular: Normal rate, regular rhythm, normal heart sounds and normal pulses.  Occasional extrasystoles are present. PMI is not displaced. Exam reveals no gallop and no friction rub.  No murmur heard. Pulmonary/Chest: Breath sounds normal. No respiratory distress. She has no wheezes. She has no rales.  Musculoskeletal:        General: No edema. Normal range of motion.     Cervical back: Normal range of motion and neck supple.  Neurological: She is alert and oriented to person, place, and time.  Psychiatric: She has a normal mood and affect. Her behavior is normal. Judgment and thought content normal.  Vitals reviewed.   Adult ECG Report  Rate: 72 ;  Rhythm: normal sinus rhythm, premature atrial contractions (PAC) and Cannot rule out anteroseptal MI, age undetermined.;  Normal axis, intervals and durations.  Narrative Interpretation: Stable EKG.  Recent Labs: May 2020: TC 219, TG 167, HDL 55 LDL 132.  A1c 5.3.  Hgb 12.3.  Cr 0.3, K+ 4.3.  ASSESSMENT/PLAN    Problem List Items Addressed This Visit    Palpitations (Chronic)    Pretty well controlled.  Seems like her switched supplement with magnesium is helping this out.  Prefers not to use medications.      Relevant Orders   EKG 12-Lead   Hyperlipidemia with target LDL less than 130 - Primary (Chronic)    Her LDL is still a bit high and her triglycerides are little bit high as well.  Having difficulty taking cholestyramine. Based on there being concerns for bile acid reuptake, probably the best option for this would be Zetia.  She would like to not take a statin. Plan: Zetia 10 mg daily and continue as tolerated cholestyramine.      Relevant Medications   cholestyramine (QUESTRAN) 4 GM/DOSE powder   ezetimibe (ZETIA) 10 MG tablet   Preop cardiovascular exam    She thinks she may be needing an upcoming surgery.  I did give the details.  She is relatively asymptomatic with no active  cardiac symptoms.  No angina or heart failure.  No history of stroke and has normal renal function.  Nondiabetic.  To be considered to be a low risk patient for liver surgery I do not think any further cardiac evaluation would be warranted.         COVID-19 Education: The signs and symptoms of COVID-19 were discussed with the patient and how to seek care for  testing (follow up with PCP or arrange E-visit).   The importance of social distancing was discussed today.  I spent a total of 20minutes with the patient and chart review. >  50% of the time was spent in direct patient consultation.  Additional time spent with chart review (studies, outside notes, etc): 5 Total Time: 46min  Current medicines are reviewed at length with the patient today.  (+/- concerns) needs something besides cholestyramine for lipids.   Patient Instructions / Medication Changes & Studies & Tests Ordered   Patient Instructions  Medication Instructions:  Start Zetia 10 mg daily  *If you need a refill on your cardiac medications before your next appointment, please call your pharmacy*  Lab Work: Not needed  Testing/Procedures: Not needed  Follow-Up: At [facility], you and your health needs are our priority.  As part of our continuing mission to provide you with exceptional heart care, we have created designated Provider Care Teams.  These Care Teams include your primary Cardiologist (physician) and Advanced Practice Providers (APPs -  Physician Assistants and Nurse Practitioners) who all work together to provide you with the care you need, when you need it.  Your next appointment:   12 month(s)  The format for your next appointment:   In Person  Provider:   Glenetta Hew, MD   He would be a low risk patient for any upcoming surgery.     Studies Ordered:   Orders Placed This Encounter  Procedures  . EKG 12-Lead     Glenetta Hew, M.D., M.S. Interventional Cardiologist   Pager #  [phone] Phone # [phone] [address] [place], [place] [address]   Thank you for choosing Heartcare at [facility]!!

## 2019-11-13 NOTE — Patient Instructions (Addendum)
Medication Instructions:  Start Zetia 10 mg daily  *If you need a refill on your cardiac medications before your next appointment, please call your pharmacy*  Lab Work: Not needed  Testing/Procedures: Not needed  Follow-Up: At [facility], you and your health needs are our priority.  As part of our continuing mission to provide you with exceptional heart care, we have created designated Provider Care Teams.  These Care Teams include your primary Cardiologist (physician) and Advanced Practice Providers (APPs -  Physician Assistants and Nurse Practitioners) who all work together to provide you with the care you need, when you need it.  Your next appointment:   12 month(s)  The format for your next appointment:   In Person  Provider:   Glenetta Hew, MD   He would be a low risk patient for any upcoming surgery.

## 2019-11-14 ENCOUNTER — Encounter: Payer: Self-pay | Admitting: Cardiology

## 2019-11-14 DIAGNOSIS — N83292 Other ovarian cyst, left side: Secondary | ICD-10-CM | POA: Diagnosis not present

## 2019-11-14 DIAGNOSIS — Z01810 Encounter for preprocedural cardiovascular examination: Secondary | ICD-10-CM | POA: Insufficient documentation

## 2019-11-14 NOTE — Assessment & Plan Note (Signed)
She thinks she may be needing an upcoming surgery.  I did give the details.  She is relatively asymptomatic with no active cardiac symptoms.  No angina or heart failure.  No history of stroke and has normal renal function.  Nondiabetic.  To be considered to be a low risk patient for liver surgery I do not think any further cardiac evaluation would be warranted.

## 2019-11-14 NOTE — Assessment & Plan Note (Signed)
Pretty well controlled.  Seems like her switched supplement with magnesium is helping this out.  Prefers not to use medications.

## 2019-11-14 NOTE — Assessment & Plan Note (Signed)
Her LDL is still a bit high and her triglycerides are little bit high as well.  Having difficulty taking cholestyramine. Based on there being concerns for bile acid reuptake, probably the best option for this would be Zetia.  She would like to not take a statin. Plan: Zetia 10 mg daily and continue as tolerated cholestyramine.

## 2019-12-04 DIAGNOSIS — E559 Vitamin D deficiency, unspecified: Secondary | ICD-10-CM | POA: Diagnosis not present

## 2019-12-04 DIAGNOSIS — E782 Mixed hyperlipidemia: Secondary | ICD-10-CM | POA: Diagnosis not present

## 2019-12-04 DIAGNOSIS — E039 Hypothyroidism, unspecified: Secondary | ICD-10-CM | POA: Diagnosis not present

## 2020-01-15 DIAGNOSIS — G92 Toxic encephalopathy: Secondary | ICD-10-CM | POA: Diagnosis not present

## 2020-01-15 DIAGNOSIS — M2550 Pain in unspecified joint: Secondary | ICD-10-CM | POA: Diagnosis not present

## 2020-01-15 DIAGNOSIS — E559 Vitamin D deficiency, unspecified: Secondary | ICD-10-CM | POA: Diagnosis not present

## 2020-01-15 DIAGNOSIS — R790 Abnormal level of blood mineral: Secondary | ICD-10-CM | POA: Diagnosis not present

## 2020-01-15 DIAGNOSIS — R5383 Other fatigue: Secondary | ICD-10-CM | POA: Diagnosis not present

## 2020-02-12 DIAGNOSIS — E039 Hypothyroidism, unspecified: Secondary | ICD-10-CM | POA: Diagnosis not present

## 2020-04-15 DIAGNOSIS — M2550 Pain in unspecified joint: Secondary | ICD-10-CM | POA: Diagnosis not present

## 2020-04-15 DIAGNOSIS — R5383 Other fatigue: Secondary | ICD-10-CM | POA: Diagnosis not present

## 2020-04-15 DIAGNOSIS — R2689 Other abnormalities of gait and mobility: Secondary | ICD-10-CM | POA: Diagnosis not present

## 2020-04-15 DIAGNOSIS — T560X4S Toxic effect of lead and its compounds, undetermined, sequela: Secondary | ICD-10-CM | POA: Diagnosis not present

## 2020-04-15 DIAGNOSIS — E039 Hypothyroidism, unspecified: Secondary | ICD-10-CM | POA: Diagnosis not present

## 2020-04-15 DIAGNOSIS — R41840 Attention and concentration deficit: Secondary | ICD-10-CM | POA: Diagnosis not present

## 2020-04-15 DIAGNOSIS — E559 Vitamin D deficiency, unspecified: Secondary | ICD-10-CM | POA: Diagnosis not present

## 2020-04-15 DIAGNOSIS — R790 Abnormal level of blood mineral: Secondary | ICD-10-CM | POA: Diagnosis not present

## 2020-04-15 DIAGNOSIS — D809 Immunodeficiency with predominantly antibody defects, unspecified: Secondary | ICD-10-CM | POA: Diagnosis not present

## 2020-04-16 DIAGNOSIS — Z7689 Persons encountering health services in other specified circumstances: Secondary | ICD-10-CM | POA: Diagnosis not present

## 2020-04-18 DIAGNOSIS — Z681 Body mass index (BMI) 19 or less, adult: Secondary | ICD-10-CM | POA: Diagnosis not present

## 2020-04-18 DIAGNOSIS — Z01419 Encounter for gynecological examination (general) (routine) without abnormal findings: Secondary | ICD-10-CM | POA: Diagnosis not present

## 2020-04-25 DIAGNOSIS — N3000 Acute cystitis without hematuria: Secondary | ICD-10-CM | POA: Diagnosis not present

## 2020-05-20 DIAGNOSIS — N3000 Acute cystitis without hematuria: Secondary | ICD-10-CM | POA: Diagnosis not present

## 2020-05-22 DIAGNOSIS — Z0000 Encounter for general adult medical examination without abnormal findings: Secondary | ICD-10-CM | POA: Diagnosis not present

## 2020-05-22 DIAGNOSIS — E039 Hypothyroidism, unspecified: Secondary | ICD-10-CM | POA: Diagnosis not present

## 2020-05-24 ENCOUNTER — Telehealth: Payer: Self-pay | Admitting: Internal Medicine

## 2020-05-24 NOTE — Telephone Encounter (Signed)
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 I told her Dr Caryl Comes was an EP doctor and only saw this type of paientt. She said she felt that he   the type of doctor she needs. She had an abnormal EKG, Dr Caryl Comes should  Be able to see this in Epic. Dr [name] saw EKG and referred her to Dr Caryl Comes.

## 2020-05-27 NOTE — Telephone Encounter (Signed)
I am glad to do whatever

## 2020-06-10 DIAGNOSIS — Z808 Family history of malignant neoplasm of other organs or systems: Secondary | ICD-10-CM | POA: Diagnosis not present

## 2020-06-10 DIAGNOSIS — D225 Melanocytic nevi of trunk: Secondary | ICD-10-CM | POA: Diagnosis not present

## 2020-06-10 DIAGNOSIS — D239 Other benign neoplasm of skin, unspecified: Secondary | ICD-10-CM | POA: Diagnosis not present

## 2020-06-10 DIAGNOSIS — C44519 Basal cell carcinoma of skin of other part of trunk: Secondary | ICD-10-CM | POA: Diagnosis not present

## 2020-06-10 DIAGNOSIS — L820 Inflamed seborrheic keratosis: Secondary | ICD-10-CM | POA: Diagnosis not present

## 2020-06-10 DIAGNOSIS — Z85828 Personal history of other malignant neoplasm of skin: Secondary | ICD-10-CM | POA: Diagnosis not present

## 2020-06-10 DIAGNOSIS — L821 Other seborrheic keratosis: Secondary | ICD-10-CM | POA: Diagnosis not present

## 2020-06-10 DIAGNOSIS — L905 Scar conditions and fibrosis of skin: Secondary | ICD-10-CM | POA: Diagnosis not present

## 2020-06-10 DIAGNOSIS — D485 Neoplasm of uncertain behavior of skin: Secondary | ICD-10-CM | POA: Diagnosis not present

## 2020-06-13 DIAGNOSIS — M9901 Segmental and somatic dysfunction of cervical region: Secondary | ICD-10-CM | POA: Diagnosis not present

## 2020-06-13 DIAGNOSIS — M9904 Segmental and somatic dysfunction of sacral region: Secondary | ICD-10-CM | POA: Diagnosis not present

## 2020-06-13 DIAGNOSIS — M9903 Segmental and somatic dysfunction of lumbar region: Secondary | ICD-10-CM | POA: Diagnosis not present

## 2020-06-13 DIAGNOSIS — M545 Low back pain: Secondary | ICD-10-CM | POA: Diagnosis not present

## 2020-06-13 DIAGNOSIS — M9905 Segmental and somatic dysfunction of pelvic region: Secondary | ICD-10-CM | POA: Diagnosis not present

## 2020-06-13 DIAGNOSIS — M9908 Segmental and somatic dysfunction of rib cage: Secondary | ICD-10-CM | POA: Diagnosis not present

## 2020-06-13 DIAGNOSIS — M9902 Segmental and somatic dysfunction of thoracic region: Secondary | ICD-10-CM | POA: Diagnosis not present

## 2020-06-26 ENCOUNTER — Other Ambulatory Visit: Payer: Self-pay

## 2020-06-26 ENCOUNTER — Other Ambulatory Visit: Payer: Self-pay | Admitting: Sports Medicine

## 2020-06-26 ENCOUNTER — Ambulatory Visit
Admission: RE | Admit: 2020-06-26 | Discharge: 2020-06-26 | Disposition: A | Discharge status: Home / Self Care | Payer: PPO | Source: Ambulatory Visit | Attending: Sports Medicine | Admitting: Sports Medicine

## 2020-06-26 DIAGNOSIS — M545 Low back pain, unspecified: Secondary | ICD-10-CM

## 2020-06-26 DIAGNOSIS — M25552 Pain in left hip: Secondary | ICD-10-CM

## 2020-06-26 DIAGNOSIS — M25551 Pain in right hip: Secondary | ICD-10-CM | POA: Diagnosis not present

## 2020-06-26 DIAGNOSIS — M4316 Spondylolisthesis, lumbar region: Secondary | ICD-10-CM | POA: Diagnosis not present

## 2020-06-26 DIAGNOSIS — M160 Bilateral primary osteoarthritis of hip: Secondary | ICD-10-CM | POA: Diagnosis not present

## 2020-06-26 DIAGNOSIS — M47816 Spondylosis without myelopathy or radiculopathy, lumbar region: Secondary | ICD-10-CM | POA: Diagnosis not present

## 2020-06-27 DIAGNOSIS — M9905 Segmental and somatic dysfunction of pelvic region: Secondary | ICD-10-CM | POA: Diagnosis not present

## 2020-06-27 DIAGNOSIS — M9908 Segmental and somatic dysfunction of rib cage: Secondary | ICD-10-CM | POA: Diagnosis not present

## 2020-06-27 DIAGNOSIS — M9901 Segmental and somatic dysfunction of cervical region: Secondary | ICD-10-CM | POA: Diagnosis not present

## 2020-06-27 DIAGNOSIS — M9902 Segmental and somatic dysfunction of thoracic region: Secondary | ICD-10-CM | POA: Diagnosis not present

## 2020-06-27 DIAGNOSIS — M9904 Segmental and somatic dysfunction of sacral region: Secondary | ICD-10-CM | POA: Diagnosis not present

## 2020-06-27 DIAGNOSIS — M545 Low back pain: Secondary | ICD-10-CM | POA: Diagnosis not present

## 2020-06-27 DIAGNOSIS — M9903 Segmental and somatic dysfunction of lumbar region: Secondary | ICD-10-CM | POA: Diagnosis not present

## 2020-07-03 ENCOUNTER — Other Ambulatory Visit: Payer: Self-pay

## 2020-07-03 ENCOUNTER — Ambulatory Visit: Payer: PPO | Admitting: Internal Medicine

## 2020-07-03 ENCOUNTER — Encounter: Payer: Self-pay | Admitting: Internal Medicine

## 2020-07-03 VITALS — BP 116/64 | HR 76 | Ht 66.0 in | Wt 122.4 lb

## 2020-07-03 DIAGNOSIS — R9431 Abnormal electrocardiogram [ECG] [EKG]: Secondary | ICD-10-CM | POA: Diagnosis not present

## 2020-07-03 NOTE — Patient Instructions (Signed)
Medication Instructions:  None ordered.  *If you need a refill on your cardiac medications before your next appointment, please call your pharmacy*   Lab Work: None ordered.  If you have labs (blood work) drawn today and your tests are completely normal, you will receive your results only by: Marland Kitchen MyChart Message (if you have MyChart) OR . A paper copy in the mail If you have any lab test that is abnormal or we need to change your treatment, we will call you to review the results.   Testing/Procedures: Your physician has requested that you have a lexiscan myoview. For further information please visit [email or web address]. Please follow instruction sheet, as given.    Follow-Up: At [facility], you and your health needs are our priority.  As part of our continuing mission to provide you with exceptional heart care, we have created designated Provider Care Teams.  These Care Teams include your primary Cardiologist (physician) and Advanced Practice Providers (APPs -  Physician Assistants and Nurse Practitioners) who all work together to provide you with the care you need, when you need it.  We recommend signing up for the patient portal called "MyChart".  Sign up information is provided on this After Visit Summary.  MyChart is used to connect with patients for Virtual Visits (Telemedicine).  Patients are able to view lab/test results, encounter notes, upcoming appointments, etc.  Non-urgent messages can be sent to your provider as well.   To learn more about what you can do with MyChart, go to [email or web address].    Your next appointment:   12 month(s)  The format for your next appointment:   In Person  Provider:   [name], MD

## 2020-07-03 NOTE — Progress Notes (Signed)
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  She recalls when she was 20 that she had an ECG that showed a Q-wave in lead V1.  Electrocardiogram obtained in 2019 demonstrated Q waves in lead V1 through V3 and her concern is whether she has had an anteroseptal MI  She is fit.  She runs, and rides, and dances.  All this without chest pain or shortness of breath although she does recall a 5K some time ago where she was fatigued for a number of days afterwards.  No chest pain.  She ended up having to close her practice a couple years ago because of a black mold infestation.  She saw Dr. Ellyn Hack thereafter, also having followed bilateral mastectomy for breast cancer, because of an abnormal ECG.  He undertook an event recorder that demonstrated frequently but not quantitated atrial ectopy and nonsustained atrial tachycardia as well as PVCs.  An echocardiogram was normal.  DATE TEST EF   4/19 Echo   60-65 %         Date Cr K Hgb   5/20 0.83 4.3         Event Recorder personnally reviewed     Past Medical History:  Diagnosis Date  . Anxiety   . Arthritis   . Asthma    excerise induced  . 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 Depression   . Family history of breast cancer   . Family history of melanoma   . Family history of pancreatic cancer   . Hyperlipidemia   . Hypothyroidism   . Immune disorder ([place])   . Systemic fungal infection 11/2016   Black mold -on maintenance antifungals.      Surgical History:  Past Surgical History:  Procedure  Laterality Date  . APPENDECTOMY  1983  . CHOLECYSTECTOMY  1975  . MASTECTOMY W/ SENTINEL NODE BIOPSY Bilateral 09/06/2017   Procedure: LEFT MASTECTOMY WITH LEFT SENTINEL LYMPH NODE BIOPSY AND  RIGHT PROPHYLACTIC MASTECTOMY;  Surgeon: [name], MD;  Location: [place];  Service: General;  Laterality: Bilateral;  . TONSILLECTOMY AND ADENOIDECTOMY       Home Meds: Current Meds  Medication Sig  . cholestyramine (QUESTRAN) 4 GM/DOSE powder Take 4 g by mouth. PRN  . levothyroxine (SYNTHROID) 75 MCG tablet Take 75 mcg by mouth every morning.  Marland Kitchen liothyronine (CYTOMEL) 25 MCG tablet Take 25 mcg by mouth daily.  . Multiple Vitamins-Minerals (MULTIVITAMIN WITH MINERALS) tablet Take 1 tablet by mouth daily.  . nitrofurantoin, macrocrystal-monohydrate, (MACROBID) 100 MG capsule Take 100 mg by mouth 2 (two) times daily as needed.  . progesterone (PROMETRIUM) 200 MG capsule Take 400 mg by mouth at bedtime.  . traZODone (DESYREL) 100 MG tablet Take 1 tablet (100 mg total) by mouth at bedtime.    Allergies:  Allergies  Allergen Reactions  . Sulfonamide Derivatives     REACTION: rash    Social History   Socioeconomic History  . Marital status: Divorced    Spouse name: Not on  file  . Number of children: Not on file  . Years of education: Not on file  . Highest education level: Not on file  Occupational History  . Not on file  Tobacco Use  . Smoking status: Former Smoker    Packs/day: 1.00    Years: 10.00    Pack years: 10.00    Types: Cigarettes    Quit date: 1991    Years since quitting: 30.6  . Smokeless tobacco: Never Used  Substance and Sexual Activity  . Alcohol use: Yes    Comment: rarely  . Drug use: No  . Sexual activity: Not on file  Other Topics Concern  . Not on file  Social History Narrative   Former internal medicine primary care provider.     Now currently runs a nutrition supplement business online.   Now essentially medically retired due to black mold  infestation.   She lives alone, but has a new significant other.   She has 2 cats that live with her.   She quit smoking in 1991.  She rarely drinks alcohol.   She is quite active.  Was a former runner.  Now that she is recovered from the black mold issues she is now doing cycling and interval training with run-walk..   Social Determinants of Health   Financial Resource Strain:   . Difficulty of Paying Living Expenses: Not on file  Food Insecurity:   . Worried About Charity fundraiser in the Last Year: Not on file  . Ran Out of Food in the Last Year: Not on file  Transportation Needs:   . Lack of Transportation (Medical): Not on file  . Lack of Transportation (Non-Medical): Not on file  Physical Activity:   . Days of Exercise per Week: Not on file  . Minutes of Exercise per Session: Not on file  Stress:   . Feeling of Stress : Not on file  Social Connections:   . Frequency of Communication with Friends and Family: Not on file  . Frequency of Social Gatherings with Friends and Family: Not on file  . Attends Religious Services: Not on file  . Active Member of Clubs or Organizations: Not on file  . Attends Archivist Meetings: Not on file  . Marital Status: Not on file  Intimate Partner Violence:   . Fear of Current or Ex-Partner: Not on file  . Emotionally Abused: Not on file  . Physically Abused: Not on file  . Sexually Abused: Not on file     Family History  Problem Relation Age of Onset  . Hyperlipidemia Mother   . Breast cancer Mother        bilateral mastectomy  . Melanoma Mother 3  . Hyperlipidemia Father   . Prostate cancer Father 57  . Lung cancer Father   . Birth defects Maternal Grandmother   . Breast cancer Maternal Grandmother        dx in her 46s  . Birth defects Paternal Grandmother   . Congestive Heart Failure Paternal Grandmother   . Heart disease Sister        short QT  . Other Sister        ATM+  . Lung cancer Maternal Grandfather   .  Heart attack Paternal Grandfather 29  . Cancer Sister        Anal cancer  . Other Sister        ATM-  . Healthy Sister   . Other Maternal Aunt  double mastectomy - unsure if had breast cancer  . Breast cancer Other   . Pancreatic cancer Other 100     ROS:  Please see the history of present illness.     All other systems reviewed and negative.    Physical Exam: Blood pressure 116/64, pulse 76, height 5' 6"  (1.676 m), weight 122 lb 6.4 oz (55.5 kg), SpO2 98 %. General: Well developed, well nourished female in no acute distress. Head: Normocephalic, atraumatic, sclera non-icteric, no xanthomas, nares are without discharge. EENT: normal  Lymph Nodes:  none Neck: Negative for carotid bruits. JVD not elevated. Back:without scoliosis kyphosis Lungs: Clear bilaterally to auscultation without wheezes, rales, or rhonchi. Breathing is unlabored. Heart: RRR with S1 S2. No  murmur . No rubs, or gallops appreciated. Abdomen: Soft, non-tender, non-distended with normoactive bowel sounds. No hepatomegaly. No rebound/guarding. No obvious abdominal masses. Msk:  Strength and tone appear normal for age. Extremities: No clubbing or cyanosis. No edema.  Distal pedal pulses are 2+ and equal bilaterally. Skin: Warm and Dry Neuro: Alert and oriented X 3. CN III-XII intact Grossly normal sensory and motor function . Psych:  Responds to questions appropriately with a normal affect.      Labs: Cardiac Enzymes No results for input(s): CKTOTAL, CKMB, TROPONINI in the last 72 hours. CBC Lab Results  Component Value Date   WBC 6.6 09/02/2017   HGB 12.3 09/02/2017   HCT 38.5 09/02/2017   MCV 91.4 09/02/2017   PLT 250 09/02/2017   PROTIME: No results for input(s): LABPROT, INR in the last 72 hours. Chemistry No results for input(s): NA, K, CL, CO2, BUN, CREATININE, CALCIUM, PROT, BILITOT, ALKPHOS, ALT, AST, GLUCOSE in the last 168 hours.  Invalid input(s): LABALBU Lipids No results found  for: CHOL, HDL, LDLCALC, TRIG BNP No results found for: PROBNP Thyroid Function Tests: No results for input(s): TSH, T4TOTAL, T3FREE, THYROIDAB in the last 72 hours.  Invalid input(s): FREET3 Miscellaneous No results found for: DDIMER  Radiology/Studies:  DG Lumbar Spine Complete  Result Date: 06/27/2020 CLINICAL DATA:  Low back pain and bilateral hip pain, initial encounter EXAM: LUMBAR SPINE - COMPLETE 4+ VIEW COMPARISON:  12/13/2017 FINDINGS: Five lumbar type vertebral bodies are well visualized. Vertebral body height is well maintained. No pars defects are noted. Facet hypertrophic changes are seen. Mild degenerative anterolisthesis of L4 on L5 is again noted and stable. No soft tissue abnormality is noted. Bone islands are noted within the right iliac bone stable from the prior exam. IMPRESSION: Degenerative change as described. Electronically Signed   By: Inez Catalina M.D.   On: 06/27/2020 13:37   DG HIPS BILAT WITH PELVIS 3-4 VIEWS  Result Date: 06/27/2020 CLINICAL DATA:  Bilateral hip pain for 1 year, no known injury, initial encounter EXAM: DG HIP (WITH OR WITHOUT PELVIS) 3V BILAT COMPARISON:  None. FINDINGS: Pelvic ring is intact. No acute fracture or dislocation is seen. Very mild degenerative changes of the hip joints are noted. No soft tissue abnormality is seen. IMPRESSION: No acute abnormality noted. Electronically Signed   By: Inez Catalina M.D.   On: 06/27/2020 13:38    EKG: Sinus at 76 Intervals 19/09/38 Q waves lead V1 V2-V3 Frequent runs of nonsustained atrial tachycardia cycle length of approximately 400 ms with variable conduction   Assessment and Plan:   Abnormal ECG  Atrial ectopy-PACs and nonsustained atrial tachycardia  Breast cancer status postmastectomy  Black mold exposure    Patient has an abnormal ECG concerning to her  for anteroseptal MI.  ECG was also abnormal 2019 and echocardiogram at that time showed normal LV function.  However, this is  created a great deal of anxiety.  Hence, excluding a myocardial infarction becomes important.  Options include MRI scanning and/or Myoview scanning.  However, given the fact that she also has frequent atrial ectopy, the burden of which is unquantitated and the potential for LV dysfunction unpredictable, we will undertake Myoview scanning with stress to look at both perfusion as well as evidence of a prior MI.  The absence of symptoms related to ectopy mitigates the need for immediate therapy in the absence of LV dysfunction.     [name]

## 2020-07-04 DIAGNOSIS — Z9011 Acquired absence of right breast and nipple: Secondary | ICD-10-CM | POA: Diagnosis not present

## 2020-07-04 DIAGNOSIS — C50912 Malignant neoplasm of unspecified site of left female breast: Secondary | ICD-10-CM | POA: Diagnosis not present

## 2020-07-09 DIAGNOSIS — C50912 Malignant neoplasm of unspecified site of left female breast: Secondary | ICD-10-CM | POA: Diagnosis not present

## 2020-07-09 DIAGNOSIS — Z9011 Acquired absence of right breast and nipple: Secondary | ICD-10-CM | POA: Diagnosis not present

## 2020-07-16 DIAGNOSIS — Z9011 Acquired absence of right breast and nipple: Secondary | ICD-10-CM | POA: Diagnosis not present

## 2020-07-16 DIAGNOSIS — C50912 Malignant neoplasm of unspecified site of left female breast: Secondary | ICD-10-CM | POA: Diagnosis not present

## 2020-07-18 ENCOUNTER — Encounter (HOSPITAL_COMMUNITY): Payer: Self-pay | Admitting: *Deleted

## 2020-07-18 DIAGNOSIS — C44519 Basal cell carcinoma of skin of other part of trunk: Secondary | ICD-10-CM | POA: Diagnosis not present

## 2020-07-22 ENCOUNTER — Telehealth (HOSPITAL_COMMUNITY): Payer: Self-pay | Admitting: *Deleted

## 2020-07-22 NOTE — Telephone Encounter (Signed)
My Chart letter sent to patient with instructions for upcoming Myocardial Perfusion test.  Whitney [name]

## 2020-07-24 ENCOUNTER — Ambulatory Visit (HOSPITAL_COMMUNITY): Payer: PPO

## 2020-07-24 ENCOUNTER — Other Ambulatory Visit: Payer: Self-pay

## 2020-07-30 ENCOUNTER — Ambulatory Visit (HOSPITAL_COMMUNITY): Payer: PPO | Attending: Cardiovascular Disease

## 2020-07-30 ENCOUNTER — Other Ambulatory Visit: Payer: Self-pay

## 2020-07-30 VITALS — Ht 66.0 in | Wt 122.0 lb

## 2020-07-30 DIAGNOSIS — R002 Palpitations: Secondary | ICD-10-CM | POA: Diagnosis not present

## 2020-07-30 DIAGNOSIS — R9431 Abnormal electrocardiogram [ECG] [EKG]: Secondary | ICD-10-CM | POA: Insufficient documentation

## 2020-07-30 LAB — MYOCARDIAL PERFUSION IMAGING
LV dias vol: 76 mL (ref 46–106)
LV sys vol: 23 mL
Peak HR: 99 {beats}/min
Rest HR: 66 {beats}/min
SDS: 1
SRS: 0
SSS: 1
TID: 1.08

## 2020-07-30 MED ORDER — TECHNETIUM TC 99M TETROFOSMIN IV KIT
9.1000 | PACK | Freq: Once | INTRAVENOUS | Status: AC | PRN
  Administered 2020-07-30: 9.1 via INTRAVENOUS
  Filled 2020-07-30: qty 10

## 2020-07-30 MED ORDER — TECHNETIUM TC 99M TETROFOSMIN IV KIT
31.6000 | PACK | Freq: Once | INTRAVENOUS | Status: AC | PRN
  Administered 2020-07-30: 31.6 via INTRAVENOUS
  Filled 2020-07-30: qty 32

## 2020-07-30 MED ORDER — REGADENOSON 0.4 MG/5ML IV SOLN
0.4000 mg | Freq: Once | INTRAVENOUS | Status: AC
  Administered 2020-07-30: 0.4 mg via INTRAVENOUS

## 2021-01-21 DIAGNOSIS — M25572 Pain in left ankle and joints of left foot: Secondary | ICD-10-CM | POA: Diagnosis not present

## 2021-01-21 DIAGNOSIS — M9908 Segmental and somatic dysfunction of rib cage: Secondary | ICD-10-CM | POA: Diagnosis not present

## 2021-01-21 DIAGNOSIS — M9906 Segmental and somatic dysfunction of lower extremity: Secondary | ICD-10-CM | POA: Diagnosis not present

## 2021-01-21 DIAGNOSIS — M9901 Segmental and somatic dysfunction of cervical region: Secondary | ICD-10-CM | POA: Diagnosis not present

## 2021-01-21 DIAGNOSIS — M5451 Vertebrogenic low back pain: Secondary | ICD-10-CM | POA: Diagnosis not present

## 2021-01-21 DIAGNOSIS — M9907 Segmental and somatic dysfunction of upper extremity: Secondary | ICD-10-CM | POA: Diagnosis not present

## 2021-01-21 DIAGNOSIS — M9902 Segmental and somatic dysfunction of thoracic region: Secondary | ICD-10-CM | POA: Diagnosis not present

## 2021-01-21 DIAGNOSIS — M545 Low back pain, unspecified: Secondary | ICD-10-CM | POA: Diagnosis not present

## 2021-01-21 DIAGNOSIS — M25532 Pain in left wrist: Secondary | ICD-10-CM | POA: Diagnosis not present

## 2021-01-21 DIAGNOSIS — M9900 Segmental and somatic dysfunction of head region: Secondary | ICD-10-CM | POA: Diagnosis not present

## 2021-02-04 DIAGNOSIS — M9905 Segmental and somatic dysfunction of pelvic region: Secondary | ICD-10-CM | POA: Diagnosis not present

## 2021-02-04 DIAGNOSIS — M9901 Segmental and somatic dysfunction of cervical region: Secondary | ICD-10-CM | POA: Diagnosis not present

## 2021-02-04 DIAGNOSIS — M9902 Segmental and somatic dysfunction of thoracic region: Secondary | ICD-10-CM | POA: Diagnosis not present

## 2021-02-04 DIAGNOSIS — M9906 Segmental and somatic dysfunction of lower extremity: Secondary | ICD-10-CM | POA: Diagnosis not present

## 2021-02-04 DIAGNOSIS — M25532 Pain in left wrist: Secondary | ICD-10-CM | POA: Diagnosis not present

## 2021-02-04 DIAGNOSIS — M25572 Pain in left ankle and joints of left foot: Secondary | ICD-10-CM | POA: Diagnosis not present

## 2021-02-04 DIAGNOSIS — M9900 Segmental and somatic dysfunction of head region: Secondary | ICD-10-CM | POA: Diagnosis not present

## 2021-02-04 DIAGNOSIS — M9907 Segmental and somatic dysfunction of upper extremity: Secondary | ICD-10-CM | POA: Diagnosis not present

## 2021-02-04 DIAGNOSIS — M9908 Segmental and somatic dysfunction of rib cage: Secondary | ICD-10-CM | POA: Diagnosis not present

## 2021-02-04 DIAGNOSIS — M542 Cervicalgia: Secondary | ICD-10-CM | POA: Diagnosis not present

## 2021-02-11 DIAGNOSIS — M25541 Pain in joints of right hand: Secondary | ICD-10-CM | POA: Diagnosis not present

## 2021-02-11 DIAGNOSIS — M9901 Segmental and somatic dysfunction of cervical region: Secondary | ICD-10-CM | POA: Diagnosis not present

## 2021-02-11 DIAGNOSIS — M9907 Segmental and somatic dysfunction of upper extremity: Secondary | ICD-10-CM | POA: Diagnosis not present

## 2021-02-11 DIAGNOSIS — M9908 Segmental and somatic dysfunction of rib cage: Secondary | ICD-10-CM | POA: Diagnosis not present

## 2021-02-11 DIAGNOSIS — M25521 Pain in right elbow: Secondary | ICD-10-CM | POA: Diagnosis not present

## 2021-02-11 DIAGNOSIS — M25511 Pain in right shoulder: Secondary | ICD-10-CM | POA: Diagnosis not present

## 2021-02-12 DIAGNOSIS — M25521 Pain in right elbow: Secondary | ICD-10-CM | POA: Diagnosis not present

## 2021-02-12 DIAGNOSIS — M542 Cervicalgia: Secondary | ICD-10-CM | POA: Diagnosis not present

## 2021-02-12 DIAGNOSIS — M4722 Other spondylosis with radiculopathy, cervical region: Secondary | ICD-10-CM | POA: Diagnosis not present

## 2021-02-12 DIAGNOSIS — M25511 Pain in right shoulder: Secondary | ICD-10-CM | POA: Diagnosis not present

## 2021-02-12 DIAGNOSIS — M5451 Vertebrogenic low back pain: Secondary | ICD-10-CM | POA: Diagnosis not present

## 2021-02-18 ENCOUNTER — Other Ambulatory Visit (HOSPITAL_BASED_OUTPATIENT_CLINIC_OR_DEPARTMENT_OTHER): Payer: Self-pay | Admitting: Sports Medicine

## 2021-02-18 DIAGNOSIS — M25521 Pain in right elbow: Secondary | ICD-10-CM

## 2021-02-18 DIAGNOSIS — M542 Cervicalgia: Secondary | ICD-10-CM

## 2021-02-18 DIAGNOSIS — M5451 Vertebrogenic low back pain: Secondary | ICD-10-CM

## 2021-02-19 ENCOUNTER — Ambulatory Visit (HOSPITAL_BASED_OUTPATIENT_CLINIC_OR_DEPARTMENT_OTHER)
Admission: RE | Admit: 2021-02-19 | Discharge: 2021-02-19 | Disposition: A | Discharge status: Home / Self Care | Payer: PPO | Source: Ambulatory Visit | Attending: Sports Medicine | Admitting: Sports Medicine

## 2021-02-19 ENCOUNTER — Other Ambulatory Visit: Payer: Self-pay

## 2021-02-19 DIAGNOSIS — M25521 Pain in right elbow: Secondary | ICD-10-CM | POA: Diagnosis not present

## 2021-02-19 DIAGNOSIS — M5451 Vertebrogenic low back pain: Secondary | ICD-10-CM | POA: Insufficient documentation

## 2021-02-19 DIAGNOSIS — M50322 Other cervical disc degeneration at C5-C6 level: Secondary | ICD-10-CM | POA: Diagnosis not present

## 2021-02-19 DIAGNOSIS — M4182 Other forms of scoliosis, cervical region: Secondary | ICD-10-CM | POA: Diagnosis not present

## 2021-02-19 DIAGNOSIS — M4722 Other spondylosis with radiculopathy, cervical region: Secondary | ICD-10-CM | POA: Diagnosis not present

## 2021-02-19 DIAGNOSIS — M542 Cervicalgia: Secondary | ICD-10-CM

## 2021-02-19 DIAGNOSIS — M9901 Segmental and somatic dysfunction of cervical region: Secondary | ICD-10-CM | POA: Diagnosis not present

## 2021-02-19 DIAGNOSIS — M50321 Other cervical disc degeneration at C4-C5 level: Secondary | ICD-10-CM | POA: Diagnosis not present

## 2021-02-19 DIAGNOSIS — M546 Pain in thoracic spine: Secondary | ICD-10-CM | POA: Diagnosis not present

## 2021-03-07 DIAGNOSIS — R531 Weakness: Secondary | ICD-10-CM | POA: Diagnosis not present

## 2021-03-07 DIAGNOSIS — M25521 Pain in right elbow: Secondary | ICD-10-CM | POA: Diagnosis not present

## 2021-03-07 DIAGNOSIS — M25541 Pain in joints of right hand: Secondary | ICD-10-CM | POA: Diagnosis not present

## 2021-03-10 ENCOUNTER — Other Ambulatory Visit: Payer: Self-pay | Admitting: Sports Medicine

## 2021-03-10 DIAGNOSIS — D0512 Intraductal carcinoma in situ of left breast: Secondary | ICD-10-CM

## 2021-03-10 DIAGNOSIS — M542 Cervicalgia: Secondary | ICD-10-CM

## 2021-03-13 ENCOUNTER — Ambulatory Visit
Admission: RE | Admit: 2021-03-13 | Discharge: 2021-03-13 | Disposition: A | Discharge status: Home / Self Care | Payer: PPO | Source: Ambulatory Visit | Attending: Sports Medicine | Admitting: Sports Medicine

## 2021-03-13 ENCOUNTER — Other Ambulatory Visit: Payer: Self-pay

## 2021-03-13 DIAGNOSIS — D0512 Intraductal carcinoma in situ of left breast: Secondary | ICD-10-CM

## 2021-03-13 DIAGNOSIS — M4802 Spinal stenosis, cervical region: Secondary | ICD-10-CM | POA: Diagnosis not present

## 2021-03-13 DIAGNOSIS — M542 Cervicalgia: Secondary | ICD-10-CM

## 2021-03-24 ENCOUNTER — Other Ambulatory Visit: Payer: Self-pay | Admitting: Sports Medicine

## 2021-03-25 ENCOUNTER — Other Ambulatory Visit: Payer: Self-pay | Admitting: Sports Medicine

## 2021-03-25 DIAGNOSIS — M5412 Radiculopathy, cervical region: Secondary | ICD-10-CM

## 2021-04-09 ENCOUNTER — Ambulatory Visit
Admission: RE | Admit: 2021-04-09 | Discharge: 2021-04-09 | Disposition: A | Discharge status: Home / Self Care | Payer: PPO | Source: Ambulatory Visit | Attending: Sports Medicine | Admitting: Sports Medicine

## 2021-04-09 ENCOUNTER — Other Ambulatory Visit: Payer: Self-pay

## 2021-04-09 DIAGNOSIS — M5412 Radiculopathy, cervical region: Secondary | ICD-10-CM

## 2021-04-09 DIAGNOSIS — M47812 Spondylosis without myelopathy or radiculopathy, cervical region: Secondary | ICD-10-CM | POA: Diagnosis not present

## 2021-04-09 MED ORDER — IOPAMIDOL (ISOVUE-M 300) INJECTION 61%
1.0000 mL | Freq: Once | INTRAMUSCULAR | Status: AC | PRN
  Administered 2021-04-09: 1 mL via EPIDURAL

## 2021-04-09 MED ORDER — TRIAMCINOLONE ACETONIDE 40 MG/ML IJ SUSP (RADIOLOGY)
60.0000 mg | Freq: Once | INTRAMUSCULAR | Status: AC
  Administered 2021-04-09: 60 mg via EPIDURAL

## 2021-04-09 NOTE — Discharge Instructions (Signed)
Post Procedure Spinal Discharge Instruction Sheet  1. You may resume a regular diet and any medications that you routinely take (including pain medications).  2. No driving day of procedure.  3. Light activity throughout the rest of the day.  Do not do any strenuous work, exercise, bending or lifting.  The day following the procedure, you can resume normal physical activity but you should refrain from exercising or physical therapy for at least three days thereafter.   Common Side Effects:   Headaches- take your usual medications as directed by your physician.  Increase your fluid intake.  Caffeinated beverages may be helpful.  Lie flat in bed until your headache resolves.   Restlessness or inability to sleep- you may have trouble sleeping for the next few days.  Ask your referring physician if you need any medication for sleep.   Facial flushing or redness- should subside within a few days.   Increased pain- a temporary increase in pain a day or two following your procedure is not unusual.  Take your pain medication as prescribed by your referring physician.   Leg cramps  Please contact our office at [phone] for the following symptoms:  Fever greater than 100 degrees.  Headaches unresolved with medication after 2-3 days.  Increased swelling, pain, or redness at injection site.   Thank you for visiting [facility] today.  

## 2021-04-23 ENCOUNTER — Other Ambulatory Visit: Payer: Self-pay | Admitting: Obstetrics and Gynecology

## 2021-05-13 DIAGNOSIS — Z1589 Genetic susceptibility to other disease: Secondary | ICD-10-CM | POA: Diagnosis not present

## 2021-05-13 DIAGNOSIS — R350 Frequency of micturition: Secondary | ICD-10-CM | POA: Diagnosis not present

## 2021-05-13 DIAGNOSIS — N950 Postmenopausal bleeding: Secondary | ICD-10-CM | POA: Diagnosis not present

## 2021-05-13 DIAGNOSIS — N83202 Unspecified ovarian cyst, left side: Secondary | ICD-10-CM | POA: Diagnosis not present

## 2021-05-16 ENCOUNTER — Encounter (HOSPITAL_BASED_OUTPATIENT_CLINIC_OR_DEPARTMENT_OTHER): Payer: Self-pay | Admitting: Obstetrics and Gynecology

## 2021-05-16 ENCOUNTER — Other Ambulatory Visit: Payer: Self-pay

## 2021-05-16 NOTE — Progress Notes (Signed)
Spoke w/ via phone for pre-op interview--- Pt Lab needs dos---- CBC, T&S              Lab results------ current ekg in epic/ chart COVID test -----patient states asymptomatic no test needed Arrive at -------  1130 on 05-20-2021 NPO after MN NO Solid Food.  Clear liquids from MN until--- 1030 Med rec completed Medications to take morning of surgery ----- Synthroid, Cytomel Diabetic medication ----- n/a Patient instructed no nail polish to be worn day of surgery Patient instructed to bring photo id and insurance card day of surgery Patient aware to have Driver (ride ) / caregiver for 24 hours after surgery --  husband, Sallye Ober Patient Special Instructions ----- n/a Pre-Op special Istructions -----  Pt request due to recent neck injury please do not hyperextend her neck, ROM is ok. Patient verbalized understanding of instructions that were given at this phone interview. Patient denies shortness of breath, chest pain, fever, cough at this phone interview.

## 2021-05-20 ENCOUNTER — Ambulatory Visit (HOSPITAL_BASED_OUTPATIENT_CLINIC_OR_DEPARTMENT_OTHER): Payer: PPO | Admitting: Anesthesiology

## 2021-05-20 ENCOUNTER — Encounter (HOSPITAL_BASED_OUTPATIENT_CLINIC_OR_DEPARTMENT_OTHER)
Admission: RE | Disposition: A | Discharge status: Home / Self Care | Payer: Self-pay | Source: Home / Self Care | Attending: Obstetrics and Gynecology

## 2021-05-20 ENCOUNTER — Other Ambulatory Visit: Payer: Self-pay

## 2021-05-20 ENCOUNTER — Ambulatory Visit (HOSPITAL_BASED_OUTPATIENT_CLINIC_OR_DEPARTMENT_OTHER)
Admission: RE | Admit: 2021-05-20 | Discharge: 2021-05-20 | Disposition: A | Discharge status: Home / Self Care | Payer: PPO | Attending: Obstetrics and Gynecology | Admitting: Obstetrics and Gynecology

## 2021-05-20 DIAGNOSIS — N83292 Other ovarian cyst, left side: Secondary | ICD-10-CM | POA: Diagnosis not present

## 2021-05-20 DIAGNOSIS — Z803 Family history of malignant neoplasm of breast: Secondary | ICD-10-CM | POA: Diagnosis not present

## 2021-05-20 DIAGNOSIS — Z882 Allergy status to sulfonamides status: Secondary | ICD-10-CM | POA: Diagnosis not present

## 2021-05-20 DIAGNOSIS — Z85828 Personal history of other malignant neoplasm of skin: Secondary | ICD-10-CM | POA: Diagnosis not present

## 2021-05-20 DIAGNOSIS — Z800 Family history of malignant neoplasm of digestive organs: Secondary | ICD-10-CM | POA: Diagnosis not present

## 2021-05-20 DIAGNOSIS — N736 Female pelvic peritoneal adhesions (postinfective): Secondary | ICD-10-CM | POA: Diagnosis not present

## 2021-05-20 DIAGNOSIS — Z853 Personal history of malignant neoplasm of breast: Secondary | ICD-10-CM | POA: Insufficient documentation

## 2021-05-20 DIAGNOSIS — K660 Peritoneal adhesions (postprocedural) (postinfection): Secondary | ICD-10-CM | POA: Insufficient documentation

## 2021-05-20 DIAGNOSIS — E039 Hypothyroidism, unspecified: Secondary | ICD-10-CM | POA: Insufficient documentation

## 2021-05-20 DIAGNOSIS — N838 Other noninflammatory disorders of ovary, fallopian tube and broad ligament: Secondary | ICD-10-CM | POA: Insufficient documentation

## 2021-05-20 DIAGNOSIS — Z87891 Personal history of nicotine dependence: Secondary | ICD-10-CM | POA: Diagnosis not present

## 2021-05-20 DIAGNOSIS — Z1501 Genetic susceptibility to malignant neoplasm of breast: Secondary | ICD-10-CM | POA: Diagnosis not present

## 2021-05-20 DIAGNOSIS — Z808 Family history of malignant neoplasm of other organs or systems: Secondary | ICD-10-CM | POA: Diagnosis not present

## 2021-05-20 DIAGNOSIS — Z801 Family history of malignant neoplasm of trachea, bronchus and lung: Secondary | ICD-10-CM | POA: Diagnosis not present

## 2021-05-20 DIAGNOSIS — N83202 Unspecified ovarian cyst, left side: Secondary | ICD-10-CM | POA: Diagnosis not present

## 2021-05-20 DIAGNOSIS — Z8042 Family history of malignant neoplasm of prostate: Secondary | ICD-10-CM | POA: Insufficient documentation

## 2021-05-20 DIAGNOSIS — Z4002 Encounter for prophylactic removal of ovary: Secondary | ICD-10-CM | POA: Diagnosis not present

## 2021-05-20 DIAGNOSIS — Z8249 Family history of ischemic heart disease and other diseases of the circulatory system: Secondary | ICD-10-CM | POA: Insufficient documentation

## 2021-05-20 HISTORY — PX: ROBOTIC ASSISTED SALPINGO OOPHERECTOMY: SHX6082

## 2021-05-20 HISTORY — DX: Unspecified injury of neck, initial encounter: S19.9XXA

## 2021-05-20 HISTORY — DX: Presence of spectacles and contact lenses: Z97.3

## 2021-05-20 HISTORY — DX: Cardiac arrhythmia, unspecified: I49.9

## 2021-05-20 LAB — CBC
HCT: 40.9 % (ref 36.0–46.0)
Hemoglobin: 13.1 g/dL (ref 12.0–15.0)
MCH: 32.1 pg (ref 26.0–34.0)
MCHC: 32 g/dL (ref 30.0–36.0)
MCV: 100.2 fL — ABNORMAL HIGH (ref 80.0–100.0)
Platelets: 278 10*3/uL (ref 150–400)
RBC: 4.08 MIL/uL (ref 3.87–5.11)
RDW: 12.3 % (ref 11.5–15.5)
WBC: 7.9 10*3/uL (ref 4.0–10.5)
nRBC: 0 % (ref 0.0–0.2)

## 2021-05-20 LAB — TYPE AND SCREEN
ABO/RH(D): A POS
Antibody Screen: NEGATIVE

## 2021-05-20 LAB — ABO/RH: ABO/RH(D): A POS

## 2021-05-20 SURGERY — SALPINGO-OOPHORECTOMY, ROBOT-ASSISTED
Anesthesia: General | Site: Abdomen | Laterality: Bilateral

## 2021-05-20 MED ORDER — 0.9 % SODIUM CHLORIDE (POUR BTL) OPTIME
TOPICAL | Status: DC | PRN
  Administered 2021-05-20: 500 mL

## 2021-05-20 MED ORDER — POVIDONE-IODINE 10 % EX SWAB: 2.0000 "application " | Freq: Once | CUTANEOUS | Status: DC

## 2021-05-20 MED ORDER — BUPIVACAINE HCL (PF) 0.25 % IJ SOLN
INTRAMUSCULAR | Status: DC | PRN
  Administered 2021-05-20: 15 mL

## 2021-05-20 MED ORDER — ONDANSETRON HCL 4 MG/2ML IJ SOLN
INTRAMUSCULAR | Status: DC | PRN
  Administered 2021-05-20: 4 mg via INTRAVENOUS

## 2021-05-20 MED ORDER — ACETAMINOPHEN 10 MG/ML IV SOLN: 1000.0000 mg | Freq: Once | INTRAVENOUS | Status: DC | PRN

## 2021-05-20 MED ORDER — OXYCODONE-ACETAMINOPHEN 5-325 MG PO TABS: 1.0000 | ORAL_TABLET | ORAL | 0 refills | Status: DC | PRN

## 2021-05-20 MED ORDER — ROCURONIUM BROMIDE 10 MG/ML (PF) SYRINGE
PREFILLED_SYRINGE | INTRAVENOUS | Status: DC | PRN
  Administered 2021-05-20: 50 mg via INTRAVENOUS
  Administered 2021-05-20: 20 mg via INTRAVENOUS
  Administered 2021-05-20: 10 mg via INTRAVENOUS

## 2021-05-20 MED ORDER — ROCURONIUM BROMIDE 10 MG/ML (PF) SYRINGE
PREFILLED_SYRINGE | INTRAVENOUS | Status: AC
  Filled 2021-05-20: qty 10

## 2021-05-20 MED ORDER — SODIUM CHLORIDE 0.9 % IV SOLN
INTRAVENOUS | Status: AC
  Filled 2021-05-20: qty 2

## 2021-05-20 MED ORDER — SODIUM CHLORIDE 0.9 % IR SOLN
Status: DC | PRN
  Administered 2021-05-20: 100 mL

## 2021-05-20 MED ORDER — PROPOFOL 10 MG/ML IV BOLUS
INTRAVENOUS | Status: DC | PRN
  Administered 2021-05-20: 120 mg via INTRAVENOUS

## 2021-05-20 MED ORDER — SODIUM CHLORIDE 0.9 % IV SOLN
INTRAVENOUS | Status: DC | PRN
  Administered 2021-05-20: 60 mL

## 2021-05-20 MED ORDER — LACTATED RINGERS IV SOLN
INTRAVENOUS | Status: DC
  Administered 2021-05-20: 1000 mL via INTRAVENOUS

## 2021-05-20 MED ORDER — SUGAMMADEX SODIUM 200 MG/2ML IV SOLN
INTRAVENOUS | Status: DC | PRN
  Administered 2021-05-20: 150 mg via INTRAVENOUS

## 2021-05-20 MED ORDER — MIDAZOLAM HCL 2 MG/2ML IJ SOLN
INTRAMUSCULAR | Status: DC | PRN
  Administered 2021-05-20: 2 mg via INTRAVENOUS

## 2021-05-20 MED ORDER — SODIUM CHLORIDE (PF) 0.9 % IJ SOLN: INTRAMUSCULAR | Status: DC | PRN

## 2021-05-20 MED ORDER — ONDANSETRON HCL 4 MG/2ML IJ SOLN
INTRAMUSCULAR | Status: AC
  Filled 2021-05-20: qty 2

## 2021-05-20 MED ORDER — MIDAZOLAM HCL 2 MG/2ML IJ SOLN
INTRAMUSCULAR | Status: AC
  Filled 2021-05-20: qty 2

## 2021-05-20 MED ORDER — FENTANYL CITRATE (PF) 100 MCG/2ML IJ SOLN: 25.0000 ug | INTRAMUSCULAR | Status: DC | PRN

## 2021-05-20 MED ORDER — AMISULPRIDE (ANTIEMETIC) 5 MG/2ML IV SOLN: 10.0000 mg | Freq: Once | INTRAVENOUS | Status: DC | PRN

## 2021-05-20 MED ORDER — FENTANYL CITRATE (PF) 250 MCG/5ML IJ SOLN
INTRAMUSCULAR | Status: DC | PRN
  Administered 2021-05-20 (×4): 50 ug via INTRAVENOUS

## 2021-05-20 MED ORDER — ONDANSETRON HCL 4 MG/2ML IJ SOLN: 4.0000 mg | Freq: Once | INTRAMUSCULAR | Status: DC | PRN

## 2021-05-20 MED ORDER — SODIUM CHLORIDE 0.9 % IV SOLN
2.0000 g | INTRAVENOUS | Status: AC
  Administered 2021-05-20: 2 g via INTRAVENOUS

## 2021-05-20 MED ORDER — FENTANYL CITRATE (PF) 250 MCG/5ML IJ SOLN
INTRAMUSCULAR | Status: AC
  Filled 2021-05-20: qty 5

## 2021-05-20 MED ORDER — DEXAMETHASONE SODIUM PHOSPHATE 10 MG/ML IJ SOLN
INTRAMUSCULAR | Status: AC
  Filled 2021-05-20: qty 1

## 2021-05-20 MED ORDER — LIDOCAINE HCL (PF) 2 % IJ SOLN
INTRAMUSCULAR | Status: AC
  Filled 2021-05-20: qty 5

## 2021-05-20 MED ORDER — DEXAMETHASONE SODIUM PHOSPHATE 10 MG/ML IJ SOLN
INTRAMUSCULAR | Status: DC | PRN
  Administered 2021-05-20: 5 mg via INTRAVENOUS

## 2021-05-20 MED ORDER — LIDOCAINE 2% (20 MG/ML) 5 ML SYRINGE
INTRAMUSCULAR | Status: DC | PRN
  Administered 2021-05-20: 60 mg via INTRAVENOUS

## 2021-05-20 MED ORDER — OXYCODONE HCL 5 MG PO TABS
ORAL_TABLET | ORAL | Status: AC
  Filled 2021-05-20: qty 2

## 2021-05-20 MED ORDER — PROPOFOL 10 MG/ML IV BOLUS
INTRAVENOUS | Status: AC
  Filled 2021-05-20: qty 20

## 2021-05-20 MED ORDER — OXYCODONE HCL 5 MG PO TABS
10.0000 mg | ORAL_TABLET | ORAL | Status: DC | PRN
  Administered 2021-05-20: 10 mg via ORAL

## 2021-05-20 SURGICAL SUPPLY — 61 items
ADH SKN CLS APL DERMABOND .7 (GAUZE/BANDAGES/DRESSINGS) ×1
BAG SPEC RTRVL LRG 6X4 10 (ENDOMECHANICALS) ×2
BARRIER ADHS 3X4 INTERCEED (GAUZE/BANDAGES/DRESSINGS) IMPLANT
BRR ADH 4X3 ABS CNTRL BYND (GAUZE/BANDAGES/DRESSINGS)
CATH FOLEY 3WAY  5CC 16FR (CATHETERS) ×2
CATH FOLEY 3WAY 5CC 16FR (CATHETERS) ×1 IMPLANT
COVER BACK TABLE 60X90IN (DRAPES) ×2 IMPLANT
COVER TIP SHEARS 8 DVNC (MISCELLANEOUS) ×1 IMPLANT
COVER TIP SHEARS 8MM DA VINCI (MISCELLANEOUS) ×2
DECANTER SPIKE VIAL GLASS SM (MISCELLANEOUS) IMPLANT
DEFOGGER SCOPE WARMER CLEARIFY (MISCELLANEOUS) ×2 IMPLANT
DERMABOND ADVANCED (GAUZE/BANDAGES/DRESSINGS) ×1
DERMABOND ADVANCED .7 DNX12 (GAUZE/BANDAGES/DRESSINGS) ×1 IMPLANT
DRAPE ARM DVNC X/XI (DISPOSABLE) ×4 IMPLANT
DRAPE COLUMN DVNC XI (DISPOSABLE) ×1 IMPLANT
DRAPE DA VINCI XI ARM (DISPOSABLE) ×8
DRAPE DA VINCI XI COLUMN (DISPOSABLE) ×2
DRAPE UTILITY XL STRL (DRAPES) ×2 IMPLANT
DURAPREP 26ML APPLICATOR (WOUND CARE) ×2 IMPLANT
ELECT REM PT RETURN 9FT ADLT (ELECTROSURGICAL) ×2
ELECTRODE REM PT RTRN 9FT ADLT (ELECTROSURGICAL) ×1 IMPLANT
GAUZE 4X4 16PLY ~~LOC~~+RFID DBL (SPONGE) ×6 IMPLANT
GLOVE SURG ENC MOIS LTX SZ7 (GLOVE) IMPLANT
GLOVE SURG ENC MOIS LTX SZ7.5 (GLOVE) ×6 IMPLANT
GLOVE SURG UNDER POLY LF SZ7 (GLOVE) IMPLANT
HOLDER FOLEY CATH W/STRAP (MISCELLANEOUS) ×2 IMPLANT
IRRIG SUCT STRYKERFLOW 2 WTIP (MISCELLANEOUS) ×2
IRRIGATION SUCT STRKRFLW 2 WTP (MISCELLANEOUS) ×1 IMPLANT
KIT TURNOVER CYSTO (KITS) ×2 IMPLANT
NEEDLE INSUFFLATION 150MM (ENDOMECHANICALS) ×2 IMPLANT
NS IRRIG 500ML POUR BTL (IV SOLUTION) ×2 IMPLANT
OBTURATOR OPTICAL STANDARD 8MM (TROCAR) ×2
OBTURATOR OPTICAL STND 8 DVNC (TROCAR) ×1
OBTURATOR OPTICALSTD 8 DVNC (TROCAR) ×1 IMPLANT
OCCLUDER COLPOPNEUMO (BALLOONS) ×2 IMPLANT
PACK ROBOT WH (CUSTOM PROCEDURE TRAY) ×2 IMPLANT
PACK ROBOTIC GOWN (GOWN DISPOSABLE) ×2 IMPLANT
PACK TRENDGUARD 450 HYBRID PRO (MISCELLANEOUS) ×1 IMPLANT
PAD OB MATERNITY 4.3X12.25 (PERSONAL CARE ITEMS) ×2 IMPLANT
PAD PREP 24X48 CUFFED NSTRL (MISCELLANEOUS) ×2 IMPLANT
POUCH SPECIMEN RETRIEVAL 10MM (ENDOMECHANICALS) ×4 IMPLANT
PROTECTOR NERVE ULNAR (MISCELLANEOUS) ×4 IMPLANT
SEAL CANN UNIV 5-8 DVNC XI (MISCELLANEOUS) ×3 IMPLANT
SEAL XI 5MM-8MM UNIVERSAL (MISCELLANEOUS) ×6
SET IRRIG Y TYPE TUR BLADDER L (SET/KITS/TRAYS/PACK) IMPLANT
SET TRI-LUMEN FLTR TB AIRSEAL (TUBING) ×2 IMPLANT
SUT VIC AB 0 CT1 27 (SUTURE) ×2
SUT VIC AB 0 CT1 27XBRD ANBCTR (SUTURE) ×1 IMPLANT
SUT VICRYL 0 UR6 27IN ABS (SUTURE) ×4 IMPLANT
SUT VICRYL RAPIDE 4/0 PS 2 (SUTURE) ×4 IMPLANT
SUT VLOC 180 0 9IN  GS21 (SUTURE) ×2
SUT VLOC 180 0 9IN GS21 (SUTURE) ×1 IMPLANT
TIP RUMI ORANGE 6.7MMX12CM (TIP) IMPLANT
TIP UTERINE 5.1X6CM LAV DISP (MISCELLANEOUS) IMPLANT
TIP UTERINE 6.7X10CM GRN DISP (MISCELLANEOUS) IMPLANT
TIP UTERINE 6.7X6CM WHT DISP (MISCELLANEOUS) IMPLANT
TIP UTERINE 6.7X8CM BLUE DISP (MISCELLANEOUS) IMPLANT
TOWEL OR 17X26 10 PK STRL BLUE (TOWEL DISPOSABLE) ×2 IMPLANT
TRENDGUARD 450 HYBRID PRO PACK (MISCELLANEOUS) ×2
TROCAR PORT AIRSEAL 8X120 (TROCAR) ×2 IMPLANT
WATER STERILE IRR 1000ML POUR (IV SOLUTION) IMPLANT

## 2021-05-20 NOTE — Discharge Instructions (Signed)

## 2021-05-20 NOTE — Anesthesia Postprocedure Evaluation (Signed)
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, MD  Procedure(s) Performed: XI ROBOTIC ASSISTED SALPINGO OOPHORECTOMY (Bilateral: Abdomen)     Patient location during evaluation: PACU Anesthesia Type: General Level of consciousness: awake and alert Pain management: pain level controlled Vital Signs Assessment: post-procedure vital signs reviewed and stable Respiratory status: spontaneous breathing, nonlabored ventilation, respiratory function stable and patient connected to nasal cannula oxygen Cardiovascular status: blood pressure returned to baseline and stable Postop Assessment: no apparent nausea or vomiting Anesthetic complications: no   No notable events documented.  Last Vitals:  Vitals:   05/20/21 1445 05/20/21 1500  BP: 129/60 128/63  Pulse: 62 65  Resp: 14 12  Temp:    SpO2: 99% 100%    Last Pain:  Vitals:   05/20/21 1500  TempSrc:   PainSc: 4                  Aanyah Loa L Nicola Quesnell

## 2021-05-20 NOTE — Op Note (Signed)
[name], [name] MEDICAL RECORD NO: [id number] ACCOUNT NO: [id number] DATE OF BIRTH: 1951/01/26 FACILITY: [facility] LOCATION: [facility] PHYSICIAN: Lovenia Kim, MD  Operative Report   DATE OF PROCEDURE: 05/20/2021  PREOPERATIVE DIAGNOSES: 1.  History of invasive breast cancer. 2.  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  3.  Left ovarian cyst.  PROCEDURES:  Da Vinci-assisted BSO, lysis of left ovarian to the left ovarian fossa and left sigmoid adhesions.  SURGEON:  [name], MD  ASSISTANT:  Law, DO  ESTIMATED BLOOD LOSS:  Less than 50 mL  COMPLICATIONS:  None.  DRAINS:  Foley.  COUNTS:  Correct.  The patient to recovery in good condition.  BRIEF OPERATIVE NOTE:  After being apprised of the risks of anesthesia, infection, bleeding, injury to surrounding organs, possible need for repair, delayed versus immediate complications including bowel and bladder injury, possible need for repair,  inability to cure cancer, the patient was brought to the operating room and was administered general anesthetic without complications.  She was prepped and draped in usual sterile fashion.  Foley catheter placed.  Exam under anesthesia reveals an  anteflexed uterus, left adnexal fullness, no cul-de-sac nodularity.  At this time, Foley catheter placed. RUMI and tenaculum placed vaginally without difficulty.  Infraumbilical incision made with a scalpel.  Veress needle placed.  Opening pressure of  -1, 3.5 liters of CO2 insufflated without difficulty.  Trocar placed atraumatically.  Visualization reveals some evidence of significant omental to anterior abdominal wall adhesions that are cephalad to the umbilical incision.  The liver area and hepatic  flexure are obliterated by these adhesions.  Previous gallbladder surgery noted. Appendectomy previously done.  There are some stringy adhesions to the cecum on the right.  The anterior cul-de-sac is clear.  Uterus has some small calcified  fibroids.   The posterior cul-de-sac is clear.  The left ovary has what appears to be a simple cyst.  No evidence of ovarian excrescences.  Left ovary is slightly adherent into the left ovarian fossa and to the left sigmoid colon.  At this time, a deep Trendelenburg  position has been established.  Two robotic ports were placed on the left and one on the right.  The robot was docked in the standard fashion.  EndoShears and bipolar forceps are placed.  At this time, the left mesosalpinx was undermined and divided at  the uterotubal junction and extracted separately from the right tube.  Same procedure done on the right.  Right tube was also removed.  The retroperitoneal space was entered on the left and the infundibulopelvic ligament is skeletonized, approximately 2  cm distal to the left ovary.  This is after the ureter was identified.  This is cauterized using bipolar cautery to resistance of 0 and divided. The left ovary was subsequently divided from its left adnexal attachments, left adnexal adhesions and left  sigmoid colon sharply and the left ovarian ligament was divided and the ovary was placed intact in the cul-de-sac. On the right side, the retroperitoneal space was entered. The infundibulopelvic ligament on the right is skeletonized and ligated 2 cm  distal to the right ovary.  At this time, good hemostasis was noted.  The right ovary was further detached down to the level of the right ovarian ligament, which is cut and divided after bipolar ligation.  This ovary was also placed intact in the  cul-de-sac.  The robot was then undocked and the camera was placed through the right lateral port.  The umbilical incision was  slightly extended to accommodate a 10 mm EndoCatch bag, which is placed directly, both ovaries placed into the bag, pulled up  through the umbilical incision and ovaries were extracted piecemeal. Rupture of the left ovarian cyst is contained for clear fluid.  No evidence of  excrescences noted.  At this time, the EndoCatch sac was then removed intact. The surgical site was  reinspected and found to be hemostatic.  Ropivacaine solution was placed.  Trocars were then removed under direct visualization.  CO2 was released.  Positive pressure was applied.  Incisions closed with 4-0 Vicryl, 0 Vicryl and Dermabond.  Hulka  tenaculum removed vaginally.  Urine is clear.  The patient tolerated the procedure well and was transferred to recovery in good condition.   SHW D: 05/20/2021 2:31:40 pm T: 05/20/2021 9:06:00 pm  JOB: [id number]/ [id number]

## 2021-05-20 NOTE — Transfer of Care (Signed)
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  Last Pain:  Vitals:   05/20/21 1204  TempSrc: Oral  PainSc: 0-No pain      Patients Stated Pain Goal: 4 (27/61/84 8592)  Complications: No notable events documented.

## 2021-05-20 NOTE — Op Note (Signed)
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  PRE-OPERATIVE DIAGNOSIS:  Prophylactic, History of Breast Cancer  POST-OPERATIVE DIAGNOSIS: SAME AND LEFT OVARIAN SIMPLE CYST  PROCEDURE:  Procedure(s): XI ROBOTIC ASSISTED SALPINGO OOPHORECTOMY LYSIS OF LEFT ADNEXAL ADHESIONS  SURGEON:  Surgeon(s): [name], MD Law, Cassandra A, DO  ASSISTANTS: Law, DO   ANESTHESIA:   local and general  ESTIMATED BLOOD LOSS: 25 mL   DRAINS: Urinary Catheter (Foley)   LOCAL MEDICATIONS USED:  MARCAINE    and Amount: 20 ml  SPECIMEN:  Source of Specimen:  BSO  DISPOSITION OF SPECIMEN:  PATHOLOGY  COUNTS:  YES  DICTATION #: [id number]  PLAN OF CARE: dc home  PATIENT DISPOSITION:  PACU - hemodynamically stable.

## 2021-05-20 NOTE — Anesthesia Preprocedure Evaluation (Signed)
Anesthesia Evaluation  Patient identified by MRN, date of birth, ID band Patient awake    Reviewed: Allergy & Precautions, NPO status , Patient's Chart, lab work & pertinent test results  Airway Mallampati: II  TM Distance: >3 FB Neck ROM: Full    Dental  (+) Teeth Intact   Pulmonary neg pulmonary ROS, former smoker,    Pulmonary exam normal        Cardiovascular negative cardio ROS   Rhythm:Regular Rate:Normal     Neuro/Psych Anxiety    GI/Hepatic negative GI ROS, Neg liver ROS,   Endo/Other  Hypothyroidism   Renal/GU negative Renal ROS  Female GU complaint History of breast Ca, prophylaxis    Musculoskeletal  (+) Arthritis , Osteoarthritis,  Cervical radiculopathy,breast Ca 2018   Abdominal (+)  Abdomen: soft. Bowel sounds: normal.  Peds  Hematology negative hematology ROS (+)   Anesthesia Other Findings   Reproductive/Obstetrics                             Anesthesia Physical Anesthesia Plan  ASA: 2  Anesthesia Plan: General   Post-op Pain Management:    Induction: Intravenous  PONV Risk Score and Plan: 3 and Ondansetron, Dexamethasone and Treatment may vary due to age or medical condition  Airway Management Planned: Mask, Oral ETT and Video Laryngoscope Planned  Additional Equipment: None  Intra-op Plan:   Post-operative Plan: Extubation in OR  Informed Consent: I have reviewed the patients History and Physical, chart, labs and discussed the procedure including the risks, benefits and alternatives for the proposed anesthesia with the patient or authorized representative who has indicated his/her understanding and acceptance.     Dental advisory given  Plan Discussed with: CRNA  Anesthesia Plan Comments: (Will plan for video laryngoscope intubation given patient's cervical radiculopathy and prior request made aware to anesthesia team. )        Anesthesia  Quick Evaluation

## 2021-05-20 NOTE — Anesthesia Procedure Notes (Addendum)
Procedure Name: Intubation Date/Time: 05/20/2021 1:05 PM Performed by: [name], CRNA Pre-anesthesia Checklist: Patient identified, Emergency Drugs available, Suction available, Patient being monitored and Timeout performed Patient Re-evaluated:Patient Re-evaluated prior to induction Oxygen Delivery Method: Circle system utilized Preoxygenation: Pre-oxygenation with 100% oxygen Induction Type: IV induction Ventilation: Mask ventilation without difficulty Laryngoscope Size: Glidescope and 3 Grade View: Grade I Tube type: Oral Tube size: 7.0 mm Number of attempts: 1 Airway Equipment and Method: Rigid stylet and Video-laryngoscopy Placement Confirmation: ETT inserted through vocal cords under direct vision, positive ETCO2 and breath sounds checked- equal and bilateral Secured at: 22 cm Tube secured with: Tape Dental Injury: Teeth and Oropharynx as per pre-operative assessment  Comments: Elective glidescope after discussion with patient preop.

## 2021-05-20 NOTE — H&P (Signed)
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 History of breast ca for prophylactic BSO  Pertinent Gynecological History: Menses: post-menopausal Bleeding: na Contraception: none DES exposure: denies Blood transfusions: none Sexually transmitted diseases: no past history Previous GYN Procedures:  na   Last mammogram: normal Date: 2022 Last pap: normal Date: 20121 OB History: G0, P0   Menstrual History: Menarche age: 12 No LMP recorded. Patient is postmenopausal.    Past Medical History:  Diagnosis Date   Anxiety    Arthritis    Family history of breast cancer    Family history of melanoma    Family history of pancreatic cancer    History of basal cell carcinoma ([place]) excision 2014   left mid back   History of fungal infection 2018   systemic fungal infection due to black mold exposure   Hyperlipidemia    Hypothyroidism    followed by pcp   Irregular heartbeat    per pt asymptomatic ;   last cardiology visit w/ dr Caryl Comes 07-03-2020 for abnormal EKG, Atrial ectopy/ PACs w/ NS atrial tachycardia; work-up results in epic,  event monitor 04/ 2019 showed SR with PACs,  normal echo 04/ 2019, nuclear stress test showed normal no ischemia normal LVF nuclear ef 70%   Malignant neoplasm of upper-outer quadrant of left breast in female, estrogen receptor positive ([place]) 08/2017   oncologist--- dr [name]---  left breast DCIS , ER/PR+;   S/P left mastectomy w/ node dissestion and right prophalox masterectomy 09-06-2017,  no chemo/ radiation   Neck injury    05-16-2021  per pt had neck injury approx. 3 months ago,  had epi injeciton approx one month ago, followed by dr [name],  no rom restriction but do not hyperextend neck   Wears contact lenses     Past Surgical History:  Procedure Laterality Date   [place] Bilateral 09/06/2017   Procedure: LEFT MASTECTOMY WITH LEFT SENTINEL LYMPH NODE BIOPSY AND  RIGHT  PROPHYLACTIC MASTECTOMY;  Surgeon: [name], MD;  Location: [facility];  Service: General;  Laterality: Bilateral;   TONSILLECTOMY AND ADENOIDECTOMY     child    Family History  Problem Relation Age of Onset   Hyperlipidemia Mother    Breast cancer Mother        bilateral mastectomy   Melanoma Mother 64   Hyperlipidemia Father    Prostate cancer Father 78   Lung cancer Father    Birth defects Maternal Grandmother    Breast cancer Maternal Grandmother        dx in her 86s   Birth defects Paternal Grandmother    Congestive Heart Failure Paternal Grandmother    Heart disease Sister        short QT   Other Sister        ATM+   Lung cancer Maternal Grandfather    Heart attack Paternal Grandfather 65   Cancer Sister        Anal cancer   Other Sister        ATM-   Healthy Sister    Other Maternal Aunt        double mastectomy - unsure if had breast cancer   Breast cancer Other    Pancreatic cancer Other 100    Social History:  reports that she quit smoking about 31 years ago. Her smoking use included cigarettes. She has a 10.00 pack-year smoking history. She  has never used smokeless tobacco. She reports current alcohol use. She reports that she does not use drugs.  Allergies:  Allergies  Allergen Reactions   Sulfa Antibiotics Rash    No medications prior to admission.    Review of Systems  Constitutional: Negative.   All other systems reviewed and are negative.  Height _0  (1.676 m), weight 55.3 kg. Physical Exam Vitals and nursing note reviewed.  Constitutional:      Appearance: Normal appearance. She is normal weight.  HENT:     Head: Normocephalic and atraumatic.  Cardiovascular:     Rate and Rhythm: Normal rate and regular rhythm.     Pulses: Normal pulses.     Heart sounds: Normal heart sounds.  Pulmonary:     Effort: Pulmonary effort is normal.     Breath sounds: Normal breath sounds.  Abdominal:     General: Abdomen is flat.     Palpations:  Abdomen is soft.  Genitourinary:    General: Normal vulva.  Musculoskeletal:        General: Normal range of motion.     Cervical back: Normal range of motion and neck supple.  Skin:    General: Skin is warm and dry.  Neurological:     General: No focal deficit present.     Mental Status: She is alert and oriented to person, place, and time.  Psychiatric:        Mood and Affect: Mood normal.        Behavior: Behavior normal.    No results found for this or any previous visit (from the past 24 hour(s)).  No results found.  Assessment/Plan: History of Breast CA Prophylactic BSO Risks of anesthesia, infection, bleeding with injury to surrounding organs with need for repair discussed. Pt acknowledge these risks and wishes to proceed. Consent done.   Zeth Buday J 05/20/2021, 10:05 AM

## 2021-05-21 ENCOUNTER — Encounter (HOSPITAL_BASED_OUTPATIENT_CLINIC_OR_DEPARTMENT_OTHER): Payer: Self-pay | Admitting: Obstetrics and Gynecology

## 2021-05-21 LAB — SURGICAL PATHOLOGY

## 2021-06-11 DIAGNOSIS — E531 Pyridoxine deficiency: Secondary | ICD-10-CM | POA: Diagnosis not present

## 2021-06-11 DIAGNOSIS — R790 Abnormal level of blood mineral: Secondary | ICD-10-CM | POA: Diagnosis not present

## 2021-06-11 DIAGNOSIS — R5383 Other fatigue: Secondary | ICD-10-CM | POA: Diagnosis not present

## 2021-06-11 DIAGNOSIS — D809 Immunodeficiency with predominantly antibody defects, unspecified: Secondary | ICD-10-CM | POA: Diagnosis not present

## 2021-06-11 DIAGNOSIS — R2689 Other abnormalities of gait and mobility: Secondary | ICD-10-CM | POA: Diagnosis not present

## 2021-06-11 DIAGNOSIS — R41840 Attention and concentration deficit: Secondary | ICD-10-CM | POA: Diagnosis not present

## 2021-06-20 DIAGNOSIS — M25532 Pain in left wrist: Secondary | ICD-10-CM | POA: Diagnosis not present

## 2021-06-20 DIAGNOSIS — M9902 Segmental and somatic dysfunction of thoracic region: Secondary | ICD-10-CM | POA: Diagnosis not present

## 2021-06-20 DIAGNOSIS — M25541 Pain in joints of right hand: Secondary | ICD-10-CM | POA: Diagnosis not present

## 2021-06-20 DIAGNOSIS — M542 Cervicalgia: Secondary | ICD-10-CM | POA: Diagnosis not present

## 2021-06-20 DIAGNOSIS — M4722 Other spondylosis with radiculopathy, cervical region: Secondary | ICD-10-CM | POA: Diagnosis not present

## 2021-06-20 DIAGNOSIS — M9907 Segmental and somatic dysfunction of upper extremity: Secondary | ICD-10-CM | POA: Diagnosis not present

## 2021-06-20 DIAGNOSIS — M9901 Segmental and somatic dysfunction of cervical region: Secondary | ICD-10-CM | POA: Diagnosis not present

## 2021-06-30 ENCOUNTER — Other Ambulatory Visit: Payer: Self-pay

## 2021-06-30 DIAGNOSIS — R202 Paresthesia of skin: Secondary | ICD-10-CM

## 2021-07-01 ENCOUNTER — Ambulatory Visit: Payer: PPO | Admitting: Neurology

## 2021-07-01 ENCOUNTER — Other Ambulatory Visit: Payer: Self-pay

## 2021-07-01 DIAGNOSIS — R202 Paresthesia of skin: Secondary | ICD-10-CM

## 2021-07-01 NOTE — Procedures (Signed)
[facility] Neurology  [place], [address]  [place], [place] [address] Tel: [phone] Fax:  [phone] Test Date:  07/01/2021  Patient: Whitney [name] DOB: [birth year] Physician: Narda Amber, DO  Sex: Female Height: '5\' 6"'$  Ref Phys: Teresa Coombs, DO  ID#: [id number]   Technician:    Patient Complaints: This is a 70 year old female referred for evaluation of right hand weakness and abnormal movements.  NCV & EMG Findings: Extensive electrodiagnostic testing of the right upper extremity shows:  All sensory responses including the right median, ulnar, radial, and mixed, studies are within normal limits. Right ulnar motor response shows reduced amplitude at the abductor digit he minimi (6.2 mV) and severe conduction block across the forearm.  Of note, she does have prior soft tissue injury overlying the course of the ulnar nerve in the forearm.  Right median and ulnar motor responses are within normal limits. Chronic motor axonal loss changes are seen affecting the ulnar innervated muscles on the right, without accompanied active denervation.  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  There is an old scar from prior injury overlying the medial forearm, which most likely resulted in these findings. Correlate clinically.  There is no evidence of carpal tunnel syndrome, radial neuropathy, or cervical radiculopathy affecting the right side.  The left upper extremity was not tested, at patients request.   ___________________________ Narda Amber, DO    Nerve Conduction Studies Anti Sensory Summary Table   Stim Site NR Peak (ms) Norm Peak (ms) P-T Amp (V) Norm P-T Amp  Right Median Anti Sensory (2nd Digit)  32C  Wrist    3.1 <3.8 28.1 >10  Right Radial Anti Sensory (Base 1st Digit)  32C  Wrist    2.0 <2.8 35.0 >10  Right Ulnar Anti Sensory (5th Digit)  32C  Wrist    3.0 <3.2 20.9 >5   Motor  Summary Table   Stim Site NR Onset (ms) Norm Onset (ms) O-P Amp (mV) Norm O-P Amp Site1 Site2 Delta-0 (ms) Dist (cm) Vel (m/s) Norm Vel (m/s)  Right Median Motor (Abd Poll Brev)  32C  Wrist    3.4 <4.0 8.1 >5 Elbow Wrist 5.0 26.0 52 >50  Elbow    8.4  7.3         Right Radial Motor (Ext Ind Prop)  32C  7cm    3.0 <3.1 5.5 >5 ACF 7cm 1.1 7.0 64 >50  ACF    4.1  4.9  B-SG ACF 1.0 6.0 60   B-SG    5.1  4.9  A-SG B-SG 0.4 2.0 50   A-SG    5.5  4.6         Right Ulnar Motor (Abd Dig Minimi)  32C  Wrist    3.0 <3.1 6.2 >7 B Elbow Wrist 3.1 19.0 61 >50  B Elbow    6.1  1.1  A Elbow B Elbow 1.6 10.0 62 >50  A Elbow    7.7  1.1         Right Ulnar (FDI) Motor (1st DI)  32C  Wrist    3.8 <4.5 8.8 >7 B Elbow Wrist 3.5 19.0 54 >50  B Elbow    7.3  1.2  A Elbow B Elbow 1.5 10.0 67 >50  A Elbow    8.8  1.2          Comparison Summary Table   Stim Site NR Peak (ms) Norm Peak (ms)  P-T Amp (V) Site1 Site2 Delta-P (ms) Norm Delta (ms)  Right Median/Ulnar Palm Comparison (Wrist - 8cm)  32C  Median Palm    1.9 <2.2 95.3 Median Palm Ulnar Palm 0.0   Ulnar Palm    1.9 <2.2 45.5       EMG   Side Muscle Ins Act Fibs Psw Fasc Number Recrt Dur Dur. Amp Amp. Poly Poly. Comment  Right 1stDorInt Nml Nml Nml Nml 2- Rapid Many 1+ Many 1+ Many 1+ N/A  Right ABD Dig Min Nml Nml Nml Nml SMU Rapid All 1+ All 1+ All 1+ N/A  Right Ext Indicis Nml Nml Nml Nml Nml Nml Nml Nml Nml Nml Nml Nml N/A  Right PronatorTeres Nml Nml Nml Nml Nml Nml Nml Nml Nml Nml Nml Nml N/A  Right Biceps Nml Nml Nml Nml Nml Nml Nml Nml Nml Nml Nml Nml N/A  Right Triceps Nml Nml Nml Nml Nml Nml Nml Nml Nml Nml Nml Nml N/A  Right Deltoid Nml Nml Nml Nml Nml Nml Nml Nml Nml Nml Nml Nml N/A  Right BrachioRad Nml Nml Nml Nml Nml Nml Nml Nml Nml Nml Nml Nml N/A  Right ExtCarUln Nml Nml Nml Nml Nml Nml Nml Nml Nml Nml Nml Nml N/A  Right Ext Digitorum Nml Nml Nml Nml Nml Nml Nml Nml Nml Nml Nml Nml N/A  Right FlexCarpiUln Nml Nml Nml Nml 1-  Rapid Some 1+ Some 1+ Some 1+ N/A      Waveforms:

## 2021-07-16 DIAGNOSIS — L570 Actinic keratosis: Secondary | ICD-10-CM | POA: Diagnosis not present

## 2021-07-16 DIAGNOSIS — L820 Inflamed seborrheic keratosis: Secondary | ICD-10-CM | POA: Diagnosis not present

## 2021-07-16 DIAGNOSIS — D485 Neoplasm of uncertain behavior of skin: Secondary | ICD-10-CM | POA: Diagnosis not present

## 2021-07-16 DIAGNOSIS — L578 Other skin changes due to chronic exposure to nonionizing radiation: Secondary | ICD-10-CM | POA: Diagnosis not present

## 2021-07-16 DIAGNOSIS — L821 Other seborrheic keratosis: Secondary | ICD-10-CM | POA: Diagnosis not present

## 2021-07-16 DIAGNOSIS — C44519 Basal cell carcinoma of skin of other part of trunk: Secondary | ICD-10-CM | POA: Diagnosis not present

## 2021-07-16 DIAGNOSIS — Z85828 Personal history of other malignant neoplasm of skin: Secondary | ICD-10-CM | POA: Diagnosis not present

## 2021-07-16 DIAGNOSIS — Z808 Family history of malignant neoplasm of other organs or systems: Secondary | ICD-10-CM | POA: Diagnosis not present

## 2021-07-16 DIAGNOSIS — D225 Melanocytic nevi of trunk: Secondary | ICD-10-CM | POA: Diagnosis not present

## 2021-07-16 DIAGNOSIS — D239 Other benign neoplasm of skin, unspecified: Secondary | ICD-10-CM | POA: Diagnosis not present

## 2021-07-16 DIAGNOSIS — D2239 Melanocytic nevi of other parts of face: Secondary | ICD-10-CM | POA: Diagnosis not present

## 2021-07-18 DIAGNOSIS — C50912 Malignant neoplasm of unspecified site of left female breast: Secondary | ICD-10-CM | POA: Diagnosis not present

## 2021-07-18 DIAGNOSIS — R790 Abnormal level of blood mineral: Secondary | ICD-10-CM | POA: Diagnosis not present

## 2021-07-18 DIAGNOSIS — D809 Immunodeficiency with predominantly antibody defects, unspecified: Secondary | ICD-10-CM | POA: Diagnosis not present

## 2021-07-18 DIAGNOSIS — Z7729 Contact with and (suspected ) exposure to other hazardous substances: Secondary | ICD-10-CM | POA: Diagnosis not present

## 2021-07-18 DIAGNOSIS — R41840 Attention and concentration deficit: Secondary | ICD-10-CM | POA: Diagnosis not present

## 2021-07-18 DIAGNOSIS — D729 Disorder of white blood cells, unspecified: Secondary | ICD-10-CM | POA: Diagnosis not present

## 2021-07-18 DIAGNOSIS — E639 Nutritional deficiency, unspecified: Secondary | ICD-10-CM | POA: Diagnosis not present

## 2021-07-18 DIAGNOSIS — Z9011 Acquired absence of right breast and nipple: Secondary | ICD-10-CM | POA: Diagnosis not present

## 2021-07-18 DIAGNOSIS — E559 Vitamin D deficiency, unspecified: Secondary | ICD-10-CM | POA: Diagnosis not present

## 2021-07-25 DIAGNOSIS — M25561 Pain in right knee: Secondary | ICD-10-CM | POA: Diagnosis not present

## 2021-07-25 DIAGNOSIS — M9906 Segmental and somatic dysfunction of lower extremity: Secondary | ICD-10-CM | POA: Diagnosis not present

## 2021-07-25 DIAGNOSIS — M25572 Pain in left ankle and joints of left foot: Secondary | ICD-10-CM | POA: Diagnosis not present

## 2021-07-25 DIAGNOSIS — M9907 Segmental and somatic dysfunction of upper extremity: Secondary | ICD-10-CM | POA: Diagnosis not present

## 2021-07-25 DIAGNOSIS — M25541 Pain in joints of right hand: Secondary | ICD-10-CM | POA: Diagnosis not present

## 2021-07-25 DIAGNOSIS — M5451 Vertebrogenic low back pain: Secondary | ICD-10-CM | POA: Diagnosis not present

## 2021-07-25 DIAGNOSIS — M542 Cervicalgia: Secondary | ICD-10-CM | POA: Diagnosis not present

## 2021-07-25 DIAGNOSIS — M9902 Segmental and somatic dysfunction of thoracic region: Secondary | ICD-10-CM | POA: Diagnosis not present

## 2021-07-25 DIAGNOSIS — M9901 Segmental and somatic dysfunction of cervical region: Secondary | ICD-10-CM | POA: Diagnosis not present

## 2021-08-06 DIAGNOSIS — I491 Atrial premature depolarization: Secondary | ICD-10-CM | POA: Insufficient documentation

## 2021-08-06 DIAGNOSIS — I471 Supraventricular tachycardia: Secondary | ICD-10-CM | POA: Insufficient documentation

## 2021-08-07 ENCOUNTER — Ambulatory Visit: Payer: PPO | Admitting: Internal Medicine

## 2021-08-07 ENCOUNTER — Encounter: Payer: Self-pay | Admitting: Internal Medicine

## 2021-08-07 ENCOUNTER — Other Ambulatory Visit: Payer: Self-pay

## 2021-08-07 VITALS — BP 94/58 | HR 65 | Ht 66.0 in | Wt 123.2 lb

## 2021-08-07 DIAGNOSIS — C50912 Malignant neoplasm of unspecified site of left female breast: Secondary | ICD-10-CM | POA: Diagnosis not present

## 2021-08-07 DIAGNOSIS — R9431 Abnormal electrocardiogram [ECG] [EKG]: Secondary | ICD-10-CM | POA: Diagnosis not present

## 2021-08-07 DIAGNOSIS — I491 Atrial premature depolarization: Secondary | ICD-10-CM | POA: Diagnosis not present

## 2021-08-07 DIAGNOSIS — I471 Supraventricular tachycardia: Secondary | ICD-10-CM

## 2021-08-07 DIAGNOSIS — Z9011 Acquired absence of right breast and nipple: Secondary | ICD-10-CM | POA: Diagnosis not present

## 2021-08-07 NOTE — Progress Notes (Signed)
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  Event recorder demonstrated rare PVCs, frequent but not quantitated PACs and nonsustained runs of atrial tachycardia  She brings in her cholesterol data which gives her 10-year risk of 6.8% not withstanding her LDL of 160 or so.  She went to some meeting where multifunction cardiogram told her that she had risk for small vessel disease  EtoH much reduced    DATE TEST EF    4/19 Echo   60-65 %     9/21 Myoview     No perfusion defects    Date Cr K Hgb   5/20 0.83 4.3               History of bilateral mastectomy for breast cancer   Records and Results Reviewed   Past Medical History:  Diagnosis Date   Anxiety    Arthritis    Family history of breast cancer    Family history of melanoma    Family history of pancreatic cancer    History of basal cell carcinoma ([place]) excision 2014   left mid back   History of fungal infection 2018   systemic fungal infection due to black mold exposure   Hyperlipidemia    Hypothyroidism    followed by pcp   Irregular heartbeat    per pt asymptomatic ;   last cardiology visit w/ dr Caryl Comes 07-03-2020 for abnormal EKG, Atrial ectopy/ PACs w/ NS atrial tachycardia; work-up results in epic,  event monitor 04/ 2019 showed SR with PACs,  normal echo 04/ 2019, nuclear stress test showed normal no ischemia normal LVF nuclear ef 70%   Malignant neoplasm of upper-outer quadrant of left breast in female, estrogen receptor positive ([place]) 08/2017   oncologist--- dr [name]---  left breast DCIS , ER/PR+;   S/P left mastectomy w/ node dissestion and right prophalox masterectomy 09-06-2017,  no chemo/ radiation   Neck injury    05-16-2021  per pt had neck injury approx. 3 months ago,  had epi injeciton approx one month ago, followed by dr  [name],  no rom restriction but do not hyperextend neck   Wears contact lenses     Past Surgical History:  Procedure Laterality Date   [place] Bilateral 09/06/2017   Procedure: LEFT MASTECTOMY WITH LEFT SENTINEL LYMPH NODE BIOPSY AND  RIGHT PROPHYLACTIC MASTECTOMY;  Surgeon: [name], MD;  Location: [facility];  Service: General;  Laterality: Bilateral;   OOPHORECTOMY Bilateral    ROBOTIC ASSISTED SALPINGO OOPHERECTOMY Bilateral 05/20/2021   Procedure: XI ROBOTIC ASSISTED SALPINGO OOPHORECTOMY;  Surgeon: [name], MD;  Location: [facility];  Service: Gynecology;  Laterality: Bilateral;   TONSILLECTOMY AND ADENOIDECTOMY     child    Current Meds  Medication Sig   Emollient (DHEA EX) Apply topically as needed. vaginal   levothyroxine (SYNTHROID) 75 MCG tablet Take 75 mcg by mouth every morning.   Multiple Vitamins-Minerals (MULTIVITAMIN WITH MINERALS) tablet Take 1 tablet by mouth daily.   progesterone 200 MG SUPP Place 200 mg vaginally at bedtime.   traZODone (DESYREL) 100 MG tablet Take 1 tablet (100 mg total) by mouth at bedtime.    Allergies  Allergen Reactions   Sulfa  Antibiotics Rash      Review of Systems negative except from HPI and PMH  Physical Exam BP (!) 94/58   Pulse 65   Ht 5\' 6"  (1.676 m)   Wt 123 lb 3.2 oz (55.9 kg)   SpO2 98%   BMI 19.89 kg/m  Well developed and well nourished in no acute distress HENT normal E scleral and icterus clear Neck Supple JVP flat; carotids brisk and full Clear to ausculation  Regular rate and rhythm, no murmurs gallops or rub Soft with active bowel sounds No clubbing cyanosis  Edema Alert and oriented, grossly normal motor and sensory function Skin Warm and Dry  ECG sinus with isolated PAC  Q Waves V1-V3  CrCl cannot be calculated (Patient's most recent lab result is older than the maximum 21 days  allowed.).   Assessment and  Plan  Abnormal ECG  Hypercholesterolemia   Atrial ectopy-PACs and nonsustained atrial tachycardia   Breast cancer status postmastectomy   Black mold exposure  Abnormal electrocardiogram with anterior Q waves was not associated with perfusion defects.  She brings in her cholesterol numbers which gives her a 10-year risk of about 6-1/2%.  She is intermediate between therapy and no therapy.  We discussed the role of calcium scoring for further risk stratification and review these data.  She is agreeable  EtOH intake way down    Current medicines are reviewed at length with the patient today .  The patient does not  have concerns regarding medicines.

## 2021-08-07 NOTE — Patient Instructions (Signed)
Medication Instructions:  Your physician recommends that you continue on your current medications as directed. Please refer to the Current Medication list given to you today.  *If you need a refill on your cardiac medications before your next appointment, please call your pharmacy*   Lab Work: None ordered.  If you have labs (blood work) drawn today and your tests are completely normal, you will receive your results only by: [place] (if you have MyChart) OR A paper copy in the mail If you have any lab test that is abnormal or we need to change your treatment, we will call you to review the results.   Testing/Procedures: Dr Caryl Comes would like for you to have a calcium score CT.  Someone will call you to schedule.   Follow-Up: At [facility], you and your health needs are our priority.  As part of our continuing mission to provide you with exceptional heart care, we have created designated Provider Care Teams.  These Care Teams include your primary Cardiologist (physician) and Advanced Practice Providers (APPs -  Physician Assistants and Nurse Practitioners) who all work together to provide you with the care you need, when you need it.  We recommend signing up for the patient portal called "MyChart".  Sign up information is provided on this After Visit Summary.  MyChart is used to connect with patients for Virtual Visits (Telemedicine).  Patients are able to view lab/test results, encounter notes, upcoming appointments, etc.  Non-urgent messages can be sent to your provider as well.   To learn more about what you can do with MyChart, go to [email or web address].    Your next appointment:   12 month(s)  The format for your next appointment:   In Person  Provider:   [name], MD

## 2021-08-08 ENCOUNTER — Institutional Professional Consult (permissible substitution): Payer: PPO | Admitting: Plastic Surgery

## 2021-08-12 ENCOUNTER — Other Ambulatory Visit: Payer: Self-pay

## 2021-08-12 ENCOUNTER — Ambulatory Visit: Payer: PPO | Admitting: Plastic Surgery

## 2021-08-12 ENCOUNTER — Encounter: Payer: Self-pay | Admitting: Plastic Surgery

## 2021-08-12 DIAGNOSIS — C44519 Basal cell carcinoma of skin of other part of trunk: Secondary | ICD-10-CM | POA: Diagnosis not present

## 2021-08-12 DIAGNOSIS — C4491 Basal cell carcinoma of skin, unspecified: Secondary | ICD-10-CM | POA: Insufficient documentation

## 2021-08-12 NOTE — Progress Notes (Signed)
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   MRN: [id number]   Chief Complaint  Patient presents with   Skin Problem    Patient is a 70 year old female here for evaluation of 2 skin lesions.  She had biopsies done by Dr. Delman Cheadle.  One showed basal cell carcinoma and the other on the abdomen was a dysplastic nevus with severe atypia.  She is otherwise in good health.  She would like to have these areas removed with negative margins.  The areas are healing well with no sign of infection.  She is 5 feet 6 inches tall weighs 121 pounds. On the back is an area of 5 x 10 mm and the abdomen is 5 x 8 mm.   Review of Systems  Constitutional: Negative.   HENT: Negative.    Eyes: Negative.   Respiratory: Negative.    Cardiovascular: Negative.   Endocrine: Negative.   Genitourinary: Negative.   Skin:  Negative for pallor and rash.  Hematological: Negative.   Psychiatric/Behavioral: Negative.     Past Medical History:  Diagnosis Date   Anxiety    Arthritis    Family history of breast cancer    Family history of melanoma    Family history of pancreatic cancer    History of basal cell carcinoma ([place]) excision 2014   left mid back   History of fungal infection 2018   systemic fungal infection due to black mold exposure   Hyperlipidemia    Hypothyroidism    followed by pcp   Irregular heartbeat    per pt asymptomatic ;   last cardiology visit w/ dr Caryl Comes 07-03-2020 for abnormal EKG, Atrial ectopy/ PACs w/ NS atrial tachycardia; work-up results in epic,  event monitor 04/ 2019 showed SR with PACs,  normal echo 04/ 2019, nuclear stress test showed normal no ischemia normal LVF nuclear ef 70%   Malignant neoplasm of upper-outer quadrant of left breast in female, estrogen receptor positive ([place]) 08/2017   oncologist--- dr [name]---  left breast DCIS , ER/PR+;   S/P left mastectomy w/ node dissestion and right prophalox masterectomy 09-06-2017,  no chemo/ radiation    Neck injury    05-16-2021  per pt had neck injury approx. 3 months ago,  had epi injeciton approx one month ago, followed by dr [name],  no rom restriction but do not hyperextend neck   Wears contact lenses     Past Surgical History:  Procedure Laterality Date   [place] Bilateral 09/06/2017   Procedure: LEFT MASTECTOMY WITH LEFT SENTINEL LYMPH NODE BIOPSY AND  RIGHT PROPHYLACTIC MASTECTOMY;  Surgeon: [name], MD;  Location: [facility];  Service: General;  Laterality: Bilateral;   OOPHORECTOMY Bilateral    ROBOTIC ASSISTED SALPINGO OOPHERECTOMY Bilateral 05/20/2021   Procedure: XI ROBOTIC ASSISTED SALPINGO OOPHORECTOMY;  Surgeon: [name], MD;  Location: [facility];  Service: Gynecology;  Laterality: Bilateral;   TONSILLECTOMY AND ADENOIDECTOMY     child      Current Outpatient Medications:    Emollient (DHEA EX), Apply topically as needed. vaginal, Disp: , Rfl:    levothyroxine (SYNTHROID) 75 MCG tablet, Take 75 mcg by mouth every morning., Disp: , Rfl:    Multiple Vitamins-Minerals (MULTIVITAMIN WITH MINERALS) tablet, Take 1 tablet by mouth daily., Disp: , Rfl:    progesterone 200 MG SUPP, Place 200 mg vaginally  at bedtime., Disp: , Rfl:    traZODone (DESYREL) 100 MG tablet, Take 1 tablet (100 mg total) by mouth at bedtime., Disp: , Rfl:    Objective:   Vitals:   08/12/21 1040  BP: 117/68  Pulse: (!) 57  SpO2: 100%    Physical Exam Vitals and nursing note reviewed.  Constitutional:      Appearance: Normal appearance.  HENT:     Head: Normocephalic and atraumatic.  Cardiovascular:     Rate and Rhythm: Normal rate.     Pulses: Normal pulses.  Pulmonary:     Effort: Pulmonary effort is normal.  Abdominal:     Palpations: Abdomen is soft.  Skin:    Capillary Refill: Capillary refill takes less than 2 seconds.     Coloration: Skin is not jaundiced.     Findings: Lesion  present. No bruising.  Neurological:     Mental Status: She is alert and oriented to person, place, and time.  Psychiatric:        Mood and Affect: Mood normal.        Behavior: Behavior normal.        Thought Content: Thought content normal.    Assessment & Plan:  Basal cell carcinoma (BCC) of skin of other part of torso Pictures were obtained of the patient and placed in the chart with the patient's or guardian's permission. Plan for excision of basal cell carcinoma of the back and dysplastic nevus of the abdomen.  [place], DO

## 2021-08-20 ENCOUNTER — Inpatient Hospital Stay: Admission: RE | Admit: 2021-08-20 | Payer: Self-pay | Source: Ambulatory Visit

## 2021-08-29 ENCOUNTER — Other Ambulatory Visit: Payer: Self-pay | Admitting: Sports Medicine

## 2021-08-29 ENCOUNTER — Other Ambulatory Visit: Payer: Self-pay

## 2021-08-29 ENCOUNTER — Ambulatory Visit
Admission: RE | Admit: 2021-08-29 | Discharge: 2021-08-29 | Disposition: A | Discharge status: Home / Self Care | Payer: PPO | Source: Ambulatory Visit | Attending: Sports Medicine | Admitting: Sports Medicine

## 2021-08-29 ENCOUNTER — Telehealth: Payer: Self-pay | Admitting: Internal Medicine

## 2021-08-29 ENCOUNTER — Ambulatory Visit (INDEPENDENT_AMBULATORY_CARE_PROVIDER_SITE_OTHER)
Admission: RE | Admit: 2021-08-29 | Discharge: 2021-08-29 | Disposition: A | Discharge status: Home / Self Care | Payer: Self-pay | Source: Ambulatory Visit | Attending: Internal Medicine | Admitting: Internal Medicine

## 2021-08-29 DIAGNOSIS — M542 Cervicalgia: Secondary | ICD-10-CM

## 2021-08-29 DIAGNOSIS — M79672 Pain in left foot: Secondary | ICD-10-CM

## 2021-08-29 DIAGNOSIS — G8929 Other chronic pain: Secondary | ICD-10-CM | POA: Diagnosis not present

## 2021-08-29 DIAGNOSIS — M2578 Osteophyte, vertebrae: Secondary | ICD-10-CM | POA: Diagnosis not present

## 2021-08-29 DIAGNOSIS — I471 Supraventricular tachycardia: Secondary | ICD-10-CM

## 2021-08-29 DIAGNOSIS — I491 Atrial premature depolarization: Secondary | ICD-10-CM

## 2021-08-29 DIAGNOSIS — M47812 Spondylosis without myelopathy or radiculopathy, cervical region: Secondary | ICD-10-CM | POA: Diagnosis not present

## 2021-08-29 DIAGNOSIS — R9431 Abnormal electrocardiogram [ECG] [EKG]: Secondary | ICD-10-CM

## 2021-08-29 DIAGNOSIS — M4602 Spinal enthesopathy, cervical region: Secondary | ICD-10-CM | POA: Diagnosis not present

## 2021-08-29 NOTE — Telephone Encounter (Signed)
[facility]iology calling with a call report

## 2021-08-29 NOTE — Telephone Encounter (Signed)
Valarie Merino from [facility] Radiology calling to report CT results from scan ordered by Dr. Caryl Comes. Results showed a 21mm sclerotic lesion within left lower posterior rib. Most likely a bone island given history of breast cancer. Metastatic disease can't be excluded but felt less likely. If significant clinical concern bone scan should be performed.

## 2021-09-01 DIAGNOSIS — M5451 Vertebrogenic low back pain: Secondary | ICD-10-CM | POA: Diagnosis not present

## 2021-09-01 DIAGNOSIS — M542 Cervicalgia: Secondary | ICD-10-CM | POA: Diagnosis not present

## 2021-09-01 DIAGNOSIS — M545 Low back pain, unspecified: Secondary | ICD-10-CM | POA: Diagnosis not present

## 2021-09-01 DIAGNOSIS — M9901 Segmental and somatic dysfunction of cervical region: Secondary | ICD-10-CM | POA: Diagnosis not present

## 2021-09-01 DIAGNOSIS — M9906 Segmental and somatic dysfunction of lower extremity: Secondary | ICD-10-CM | POA: Diagnosis not present

## 2021-09-01 DIAGNOSIS — M9905 Segmental and somatic dysfunction of pelvic region: Secondary | ICD-10-CM | POA: Diagnosis not present

## 2021-09-01 DIAGNOSIS — M79672 Pain in left foot: Secondary | ICD-10-CM | POA: Diagnosis not present

## 2021-09-12 NOTE — Telephone Encounter (Signed)
Tried to call pt and left VM to return call and gave her my cell

## 2021-09-15 ENCOUNTER — Telehealth: Payer: Self-pay

## 2021-09-15 DIAGNOSIS — M899 Disorder of bone, unspecified: Secondary | ICD-10-CM

## 2021-09-15 NOTE — Telephone Encounter (Signed)
Per Dr Caryl Comes pt's cardiac CT scoring over read shows:   4 mm sclerotic lesion within the lower left posterior rib. Most likely a bone island. Given the history of breast cancer, metastatic disease cannot be entirely excluded but is felt less likely. If there is significant clinical concern of osseous metastasis, bone scan could be performed.  Pt is aware and requests bone scan.  Order placed per Dr Olin Pia request.

## 2021-09-29 DIAGNOSIS — M9901 Segmental and somatic dysfunction of cervical region: Secondary | ICD-10-CM | POA: Diagnosis not present

## 2021-09-29 DIAGNOSIS — M79652 Pain in left thigh: Secondary | ICD-10-CM | POA: Diagnosis not present

## 2021-09-29 DIAGNOSIS — Z9011 Acquired absence of right breast and nipple: Secondary | ICD-10-CM | POA: Diagnosis not present

## 2021-09-29 DIAGNOSIS — C50912 Malignant neoplasm of unspecified site of left female breast: Secondary | ICD-10-CM | POA: Diagnosis not present

## 2021-09-29 DIAGNOSIS — M9906 Segmental and somatic dysfunction of lower extremity: Secondary | ICD-10-CM | POA: Diagnosis not present

## 2021-09-29 DIAGNOSIS — M545 Low back pain, unspecified: Secondary | ICD-10-CM | POA: Diagnosis not present

## 2021-09-29 DIAGNOSIS — M25552 Pain in left hip: Secondary | ICD-10-CM | POA: Diagnosis not present

## 2021-09-29 DIAGNOSIS — M8920 Other disorders of bone development and growth, unspecified site: Secondary | ICD-10-CM | POA: Diagnosis not present

## 2021-09-30 ENCOUNTER — Ambulatory Visit (HOSPITAL_COMMUNITY)
Admission: RE | Admit: 2021-09-30 | Discharge: 2021-09-30 | Disposition: A | Discharge status: Home / Self Care | Payer: PPO | Source: Ambulatory Visit | Attending: Internal Medicine | Admitting: Internal Medicine

## 2021-09-30 ENCOUNTER — Other Ambulatory Visit: Payer: Self-pay

## 2021-09-30 ENCOUNTER — Encounter (HOSPITAL_COMMUNITY)
Admission: RE | Admit: 2021-09-30 | Discharge: 2021-09-30 | Disposition: A | Discharge status: Home / Self Care | Payer: PPO | Source: Ambulatory Visit | Attending: Internal Medicine | Admitting: Internal Medicine

## 2021-09-30 DIAGNOSIS — C50919 Malignant neoplasm of unspecified site of unspecified female breast: Secondary | ICD-10-CM | POA: Diagnosis not present

## 2021-09-30 DIAGNOSIS — M899 Disorder of bone, unspecified: Secondary | ICD-10-CM | POA: Diagnosis not present

## 2021-09-30 MED ORDER — TECHNETIUM TC 99M MEDRONATE IV KIT
20.0000 | PACK | Freq: Once | INTRAVENOUS | Status: AC | PRN
  Administered 2021-09-30: 21.2 via INTRAVENOUS

## 2021-10-20 DIAGNOSIS — D0512 Intraductal carcinoma in situ of left breast: Secondary | ICD-10-CM | POA: Diagnosis not present

## 2021-10-20 DIAGNOSIS — E639 Nutritional deficiency, unspecified: Secondary | ICD-10-CM | POA: Diagnosis not present

## 2021-10-20 DIAGNOSIS — E039 Hypothyroidism, unspecified: Secondary | ICD-10-CM | POA: Diagnosis not present

## 2021-10-20 DIAGNOSIS — M2550 Pain in unspecified joint: Secondary | ICD-10-CM | POA: Diagnosis not present

## 2021-10-20 DIAGNOSIS — L309 Dermatitis, unspecified: Secondary | ICD-10-CM | POA: Diagnosis not present

## 2021-10-20 DIAGNOSIS — R748 Abnormal levels of other serum enzymes: Secondary | ICD-10-CM | POA: Diagnosis not present

## 2021-10-20 DIAGNOSIS — D809 Immunodeficiency with predominantly antibody defects, unspecified: Secondary | ICD-10-CM | POA: Diagnosis not present

## 2021-10-24 DIAGNOSIS — H18593 Other hereditary corneal dystrophies, bilateral: Secondary | ICD-10-CM | POA: Diagnosis not present

## 2021-10-26 IMAGING — MR MR CERVICAL SPINE W/O CM
4 of 5 series · 26 of 48 positions shown · non-contrast
Comparison: Cervical and thoracic spine radiographs 02/19/2021.

CLINICAL DATA: 69-year-old female with persistent neck pain,
decreased range of motion, weakness and loss of grip in the right
upper extremity following possible golfing injury last month.

EXAM:
MRI CERVICAL SPINE WITHOUT CONTRAST
TECHNIQUE: Multiplanar, multisequence MR imaging of the cervical spine was
performed. No intravenous contrast was administered.

[Series 5: T2 · sagittal · 3.0mm · 0.55mm/px · 6 of 15 slices shown (1 of 2)]
[im 1/15]
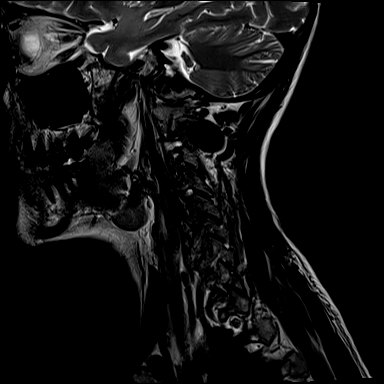
[im 3/15]
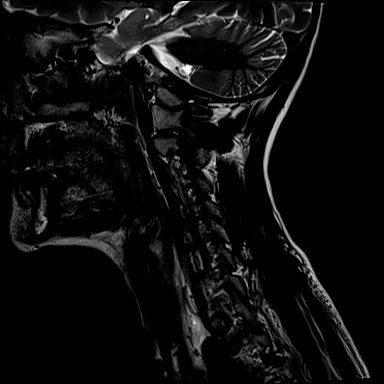
[im 6/15]
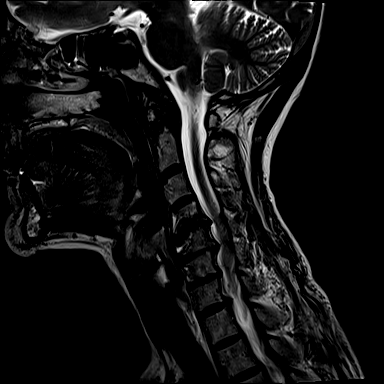
[im 9/15]
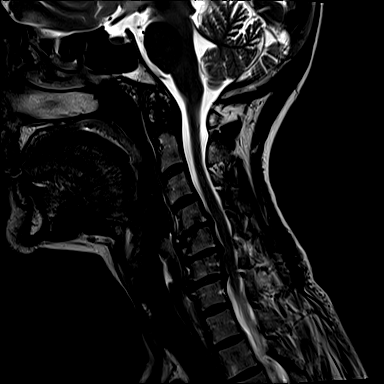
[im 12/15]
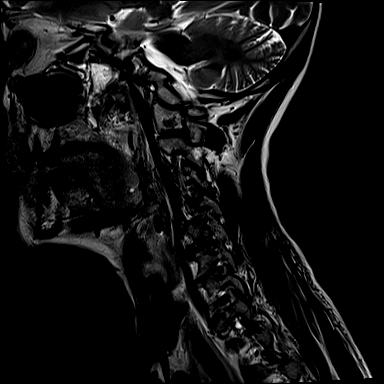
[im 15/15]
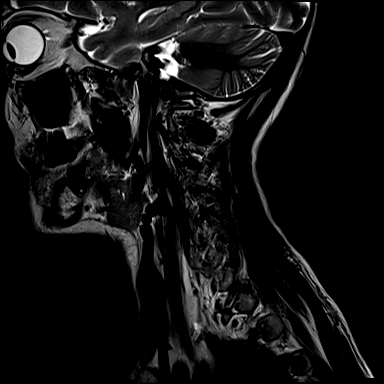

[Series 6: T1 · sagittal · 3.0mm · 0.66mm/px · 7 of 15 slices shown]
[im 1/15]
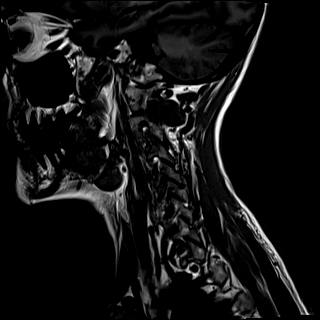
[im 3/15]
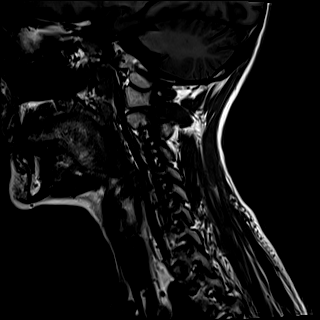
[im 5/15]
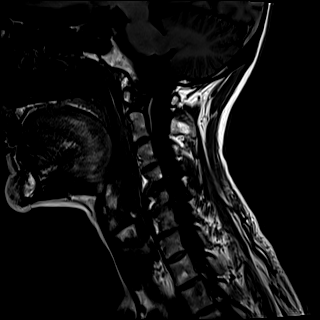
[im 8/15]
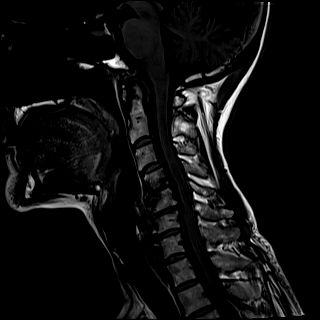
[im 10/15]
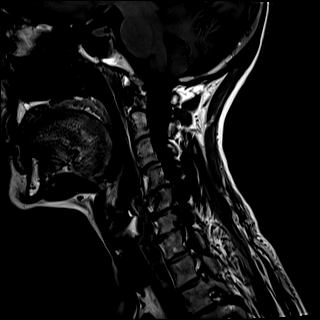
[im 12/15]
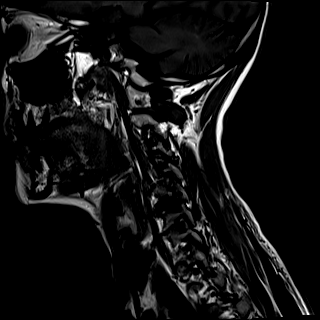
[im 15/15]
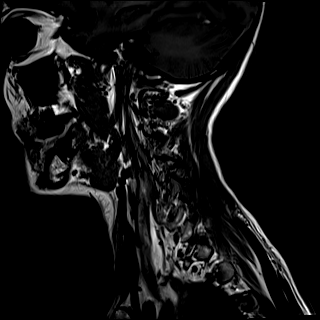

[Series 7: STIR · sagittal · 3.0mm · 0.33mm/px · 5 of 15 slices shown]
[im 1/15]
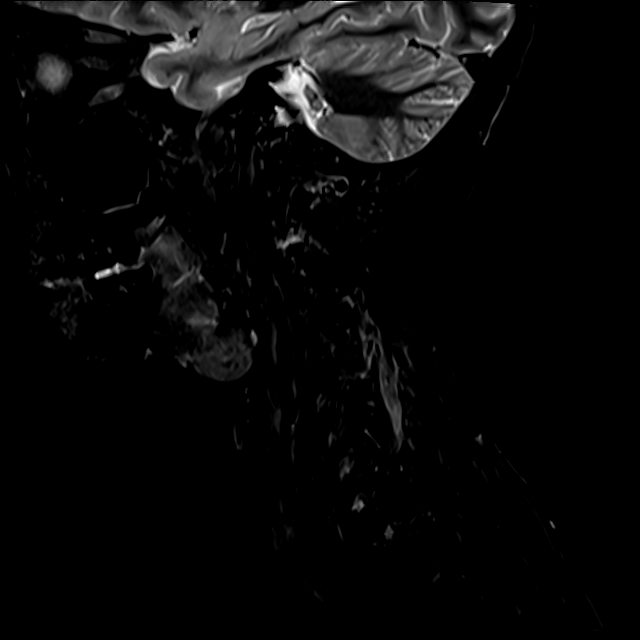
[im 3/15]
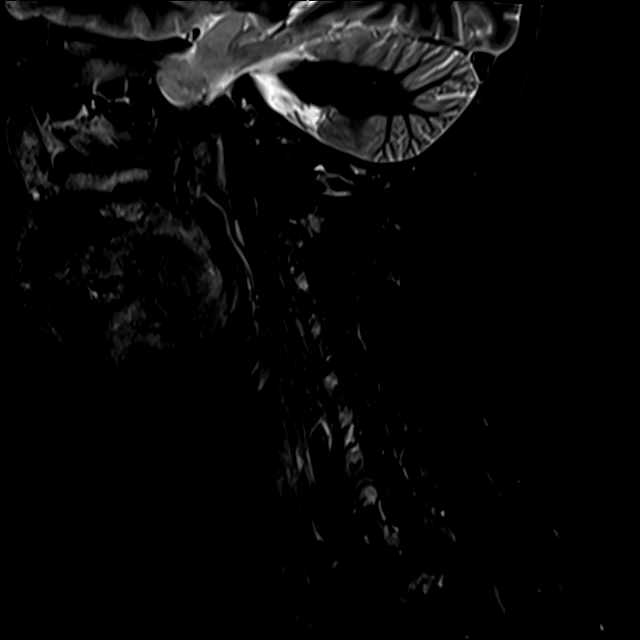
[im 5/15]
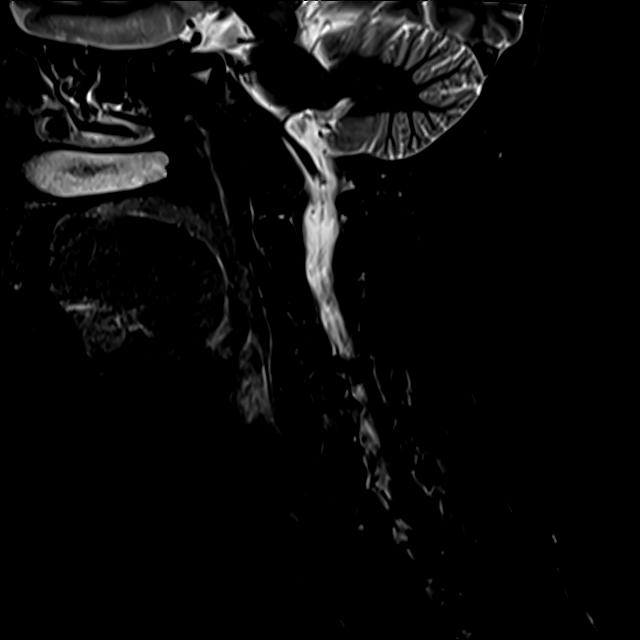
[im 8/15]
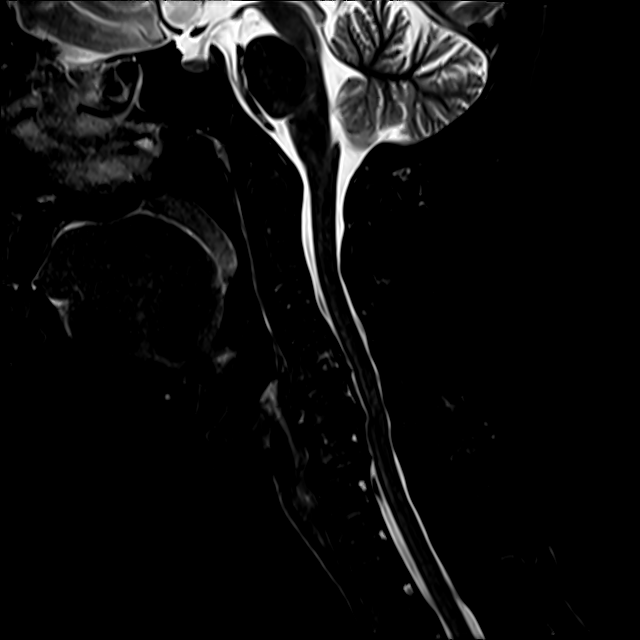
[im 12/15]
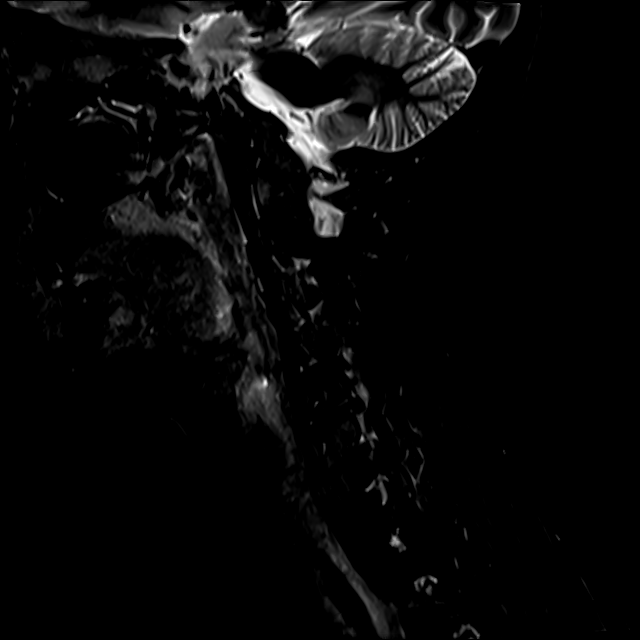

[Series 8: T2 · axial · 3.0mm · 0.50mm/px · z∈[-30,+66]mm · 8 of 32 slices shown (2 of 2)]
[im 1/32]
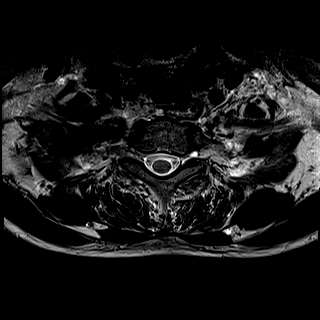
[im 5/32]
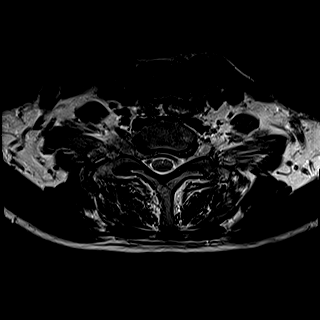
[im 10/32]
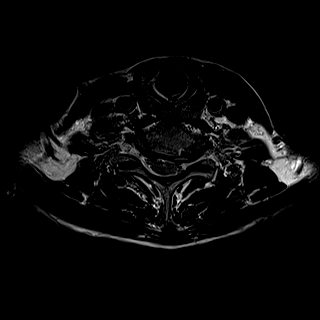
[im 15/32]
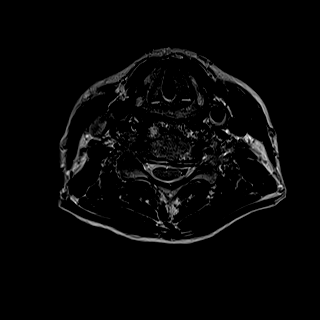
[im 17/32]
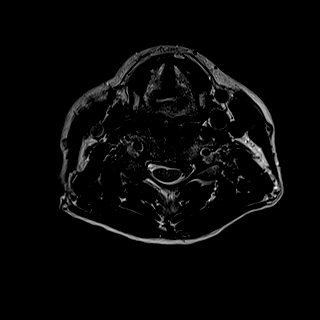
[im 22/32]
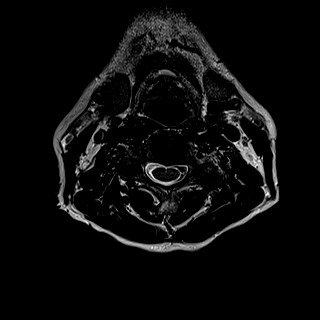
[im 27/32]
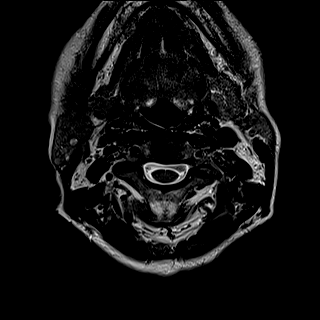
[im 32/32]
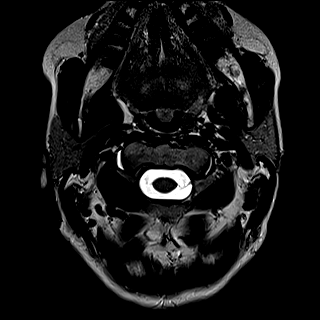

[26 of 48 positions shown; findings below may reference images not displayed]

FINDINGS: Alignment: Straightening, less reversal of cervical lordosis
compared to the radiographs last month. No significant
spondylolisthesis.

Vertebrae: Faint degenerative endplate marrow edema posteriorly on
the right at C4-C5 and C5-C6. See additional degenerative details
below. No other acute osseous abnormality identified. Normal
background bone marrow signal. Small benign appearing sclerotic
lesion in the left C1 ring incidentally noted.

Cord: No spinal cord signal abnormality despite some degenerative
cord mass effect detailed below.

Posterior Fossa, vertebral arteries, paraspinal tissues:
Cervicomedullary junction is within normal limits. Negative visible
posterior fossa and brain parenchyma. Preserved major vascular flow
voids in the neck, the right vertebral artery appears somewhat
dominant. Negative other visible neck soft tissues, lung apices.

Disc levels:

C2-C3: Negative aside from mild to moderate facet hypertrophy on the
left and mild left C3 neural foraminal stenosis.

C3-C4: Moderate facet hypertrophy on the left. Moderate left C4
neural foraminal stenosis.

C4-C5: Severe disc space loss. Circumferential disc osteophyte
complex with broad-based posterior component. Mild ligament flavum
hypertrophy. Mild spinal stenosis and ventral cord mass effect. Mild
to moderate left but moderate to severe right C5 neural foraminal
stenosis.

C5-C6: Severe disc space loss with circumferential disc osteophyte
complex. Broad-based posterior component eccentric to the left. Mild
ligament flavum hypertrophy. Mild spinal stenosis and left hemi cord
mass effect. Moderate to severe bilateral C6 neural foraminal
stenosis.

C6-C7: Disc space loss with circumferential disc osteophyte complex
greater on the left. Mild ligament flavum hypertrophy. Mild spinal
stenosis. No cord mass effect. Moderate left and mild to moderate
right C7 foraminal stenosis.

C7-T1: Negative disc. Mild facet and ligament flavum hypertrophy. No
stenosis.

Upper thoracic facet hypertrophy resulting in mild neural foraminal
stenosis at the T2 nerve level greater on the right.
IMPRESSION: 1. Advanced disc and endplate degeneration at C4-C5 and C5-C6 with
mild spinal stenosis, mild spinal cord mass effect, and moderate to
severe bilateral C5 and C6 neural foraminal stenosis. No spinal cord
signal abnormality.
2. Lesser cervical spine degeneration elsewhere. Borderline to mild
spinal stenosis at C6-C7. Up to moderate left C4 and C7 neural
foraminal stenosis.

## 2021-10-27 DIAGNOSIS — M9901 Segmental and somatic dysfunction of cervical region: Secondary | ICD-10-CM | POA: Diagnosis not present

## 2021-10-27 DIAGNOSIS — M79672 Pain in left foot: Secondary | ICD-10-CM | POA: Diagnosis not present

## 2021-10-27 DIAGNOSIS — M542 Cervicalgia: Secondary | ICD-10-CM | POA: Diagnosis not present

## 2021-10-27 DIAGNOSIS — M9906 Segmental and somatic dysfunction of lower extremity: Secondary | ICD-10-CM | POA: Diagnosis not present

## 2021-10-27 DIAGNOSIS — Z86000 Personal history of in-situ neoplasm of breast: Secondary | ICD-10-CM | POA: Diagnosis not present

## 2021-10-27 DIAGNOSIS — M9903 Segmental and somatic dysfunction of lumbar region: Secondary | ICD-10-CM | POA: Diagnosis not present

## 2021-10-27 DIAGNOSIS — M25552 Pain in left hip: Secondary | ICD-10-CM | POA: Diagnosis not present

## 2021-11-11 ENCOUNTER — Ambulatory Visit: Payer: PPO | Admitting: Plastic Surgery

## 2021-11-11 ENCOUNTER — Other Ambulatory Visit: Payer: Self-pay

## 2021-11-11 ENCOUNTER — Other Ambulatory Visit (HOSPITAL_COMMUNITY)
Admission: RE | Admit: 2021-11-11 | Discharge: 2021-11-11 | Disposition: A | Discharge status: Home / Self Care | Payer: PPO | Source: Ambulatory Visit | Attending: Plastic Surgery | Admitting: Plastic Surgery

## 2021-11-11 VITALS — BP 106/51 | HR 63 | Ht 66.0 in | Wt 122.0 lb

## 2021-11-11 DIAGNOSIS — C44519 Basal cell carcinoma of skin of other part of trunk: Secondary | ICD-10-CM | POA: Diagnosis not present

## 2021-11-11 DIAGNOSIS — L989 Disorder of the skin and subcutaneous tissue, unspecified: Secondary | ICD-10-CM | POA: Insufficient documentation

## 2021-11-11 DIAGNOSIS — D239 Other benign neoplasm of skin, unspecified: Secondary | ICD-10-CM | POA: Diagnosis not present

## 2021-11-11 DIAGNOSIS — C4491 Basal cell carcinoma of skin, unspecified: Secondary | ICD-10-CM | POA: Diagnosis not present

## 2021-11-11 NOTE — Progress Notes (Signed)
Procedure Note  Preoperative Dx:  Abdomen BCC Back dysplastic nevus  Postoperative Dx: Same  Procedure:  Excision of abdomen dysplastic nevus 1 x 1.5  2.   Excision of back BCC 1 x 1.5  Anesthesia: Lidocaine 1% with 1:100,000 epinephrine  Description of Procedure: Risks and complications were explained to the patient.  Consent was confirmed and the patient understands the risks and benefits.  The potential complications and alternatives were explained and the patient consents.  The patient expressed understanding the option of not having the procedure and the risks of a scar.  Time out was called and all information was confirmed to be correct.    Abdomen: The area was prepped and drapped.  Lidocaine 1% with epinepherine was injected in the subcutaneous area.  After waiting several minutes for the local to take affect a #15 blade was used to excise the area 1 x 1.5 in an eliptical pattern with 55mm borders from the biopsy site.  A 5-0 Monocryl was used to close the deep layers with simple interrupted stitches.  The skin edges were reapproximated with 5-0 Monocryl subcuticular running closure.  A dressing was applied.   Back: The area was prepped and drapped.  Lidocaine 1% with epinepherine was injected in the subcutaneous area.  After waiting several minutes for the local to take affect a #15 blade was used to excise the area 1 x 1.5 in an eliptical pattern with 75mm borders from the biopsy site.  A 5-0 Monocryl was used to close the deep layers with simple interrupted stitches.  The skin edges were reapproximated with 5-0 Monocryl subcuticular running closure.  A dressing was applied.   The patient was given instructions on how to care for the area and a follow up appointment.  Whitney [name] tolerated the procedure well and there were no complications. The specimens were sent to pathology.

## 2021-11-13 LAB — SURGICAL PATHOLOGY

## 2021-11-18 ENCOUNTER — Telehealth: Payer: Self-pay | Admitting: *Deleted

## 2021-11-18 NOTE — Telephone Encounter (Signed)
-----   Message from Wallace Going, DO sent at 11/17/2021 10:20 AM EST ----- Please let pt know all clear ----- Message ----- From: Interface, Lab In Three Zero One Sent: 11/13/2021   1:51 PM EST To: Loel Lofty Dillingham, DO

## 2021-11-18 NOTE — Telephone Encounter (Signed)
Called and spoke with the patient and informed her of the recent Surgical Pathology results.  Patient verbalized understanding and agreed.    Patient stated that it has been over 1 week and the bandages have come off and the big tape is still on.  She wants to know about doing strengthening training on Monday, and [place] Room dancing on tomorrow.   Called the patient back and left message informing the patient that Dr. Marla Roe left before I had a chance to ask about her questions.    Informed the patient as soon as I can speak with Dr. Marla Roe regarding her message I will give her a call back.//AB/CMA

## 2021-11-20 NOTE — Telephone Encounter (Signed)
Called and spoke with the patient and informed her that I spoke with Dr. Marla Roe regarding her questions and she stated that it will be okay for her to do her strength training, but she will need to wait on the ball room dancing until Monday.   Also informed the patient to still be careful with her activities.  Patient verbalized understanding and agreed.//AB/CMA

## 2021-11-20 NOTE — Telephone Encounter (Signed)
Called on (11/19/21) and LMOM @ 4:48pm informing the patient that I spoke with Dr. Marla Roe regarding her questions, and I will give her back.//AB/CMA

## 2021-11-21 DIAGNOSIS — Z124 Encounter for screening for malignant neoplasm of cervix: Secondary | ICD-10-CM | POA: Diagnosis not present

## 2021-11-21 DIAGNOSIS — R300 Dysuria: Secondary | ICD-10-CM | POA: Diagnosis not present

## 2021-11-21 DIAGNOSIS — Z681 Body mass index (BMI) 19 or less, adult: Secondary | ICD-10-CM | POA: Diagnosis not present

## 2021-11-21 DIAGNOSIS — Z01419 Encounter for gynecological examination (general) (routine) without abnormal findings: Secondary | ICD-10-CM | POA: Diagnosis not present

## 2021-11-21 DIAGNOSIS — Z90722 Acquired absence of ovaries, bilateral: Secondary | ICD-10-CM | POA: Diagnosis not present

## 2021-11-21 DIAGNOSIS — H18593 Other hereditary corneal dystrophies, bilateral: Secondary | ICD-10-CM | POA: Diagnosis not present

## 2021-11-21 DIAGNOSIS — Z01411 Encounter for gynecological examination (general) (routine) with abnormal findings: Secondary | ICD-10-CM | POA: Diagnosis not present

## 2021-11-25 NOTE — Progress Notes (Signed)
Patient is a 71 year old female with PMH of basal cell carcinoma on back and dysplastic nevus of abdomen both s/p excision performed in clinic by Dr. Marla Roe 11/11/2021 who presents to clinic for postoperative follow-up.  I reviewed the procedural note and both excision sites were repaired with 5-0 Monocryl subcuticular running suture closure.  Pathology was sent and reviewed.  No residual basal cell carcinoma or dysplastic nevus, margins free at each excision site.  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  Removed her sutures from back excision here today.  No surrounding erythema or evidence concerning for infection.  Healing well.  No dehiscence.  She was already aware of pathology results.  Cleared from a postprocedural standpoint.  Recommending scar mitigation creams.  Patient follow-up as needed.

## 2021-11-27 ENCOUNTER — Ambulatory Visit (INDEPENDENT_AMBULATORY_CARE_PROVIDER_SITE_OTHER): Payer: PPO | Admitting: Physician Assistant

## 2021-11-27 ENCOUNTER — Other Ambulatory Visit: Payer: Self-pay

## 2021-11-27 DIAGNOSIS — C44519 Basal cell carcinoma of skin of other part of trunk: Secondary | ICD-10-CM

## 2021-12-08 DIAGNOSIS — M9906 Segmental and somatic dysfunction of lower extremity: Secondary | ICD-10-CM | POA: Diagnosis not present

## 2021-12-08 DIAGNOSIS — M25552 Pain in left hip: Secondary | ICD-10-CM | POA: Diagnosis not present

## 2021-12-08 DIAGNOSIS — M9903 Segmental and somatic dysfunction of lumbar region: Secondary | ICD-10-CM | POA: Diagnosis not present

## 2021-12-08 DIAGNOSIS — M9901 Segmental and somatic dysfunction of cervical region: Secondary | ICD-10-CM | POA: Diagnosis not present

## 2021-12-08 DIAGNOSIS — M542 Cervicalgia: Secondary | ICD-10-CM | POA: Diagnosis not present

## 2021-12-08 DIAGNOSIS — M25561 Pain in right knee: Secondary | ICD-10-CM | POA: Diagnosis not present

## 2021-12-08 DIAGNOSIS — M9908 Segmental and somatic dysfunction of rib cage: Secondary | ICD-10-CM | POA: Diagnosis not present

## 2021-12-08 DIAGNOSIS — M9900 Segmental and somatic dysfunction of head region: Secondary | ICD-10-CM | POA: Diagnosis not present

## 2021-12-08 DIAGNOSIS — M4722 Other spondylosis with radiculopathy, cervical region: Secondary | ICD-10-CM | POA: Diagnosis not present

## 2022-01-05 DIAGNOSIS — M9905 Segmental and somatic dysfunction of pelvic region: Secondary | ICD-10-CM | POA: Diagnosis not present

## 2022-01-05 DIAGNOSIS — M9902 Segmental and somatic dysfunction of thoracic region: Secondary | ICD-10-CM | POA: Diagnosis not present

## 2022-01-05 DIAGNOSIS — M542 Cervicalgia: Secondary | ICD-10-CM | POA: Diagnosis not present

## 2022-01-05 DIAGNOSIS — M9907 Segmental and somatic dysfunction of upper extremity: Secondary | ICD-10-CM | POA: Diagnosis not present

## 2022-01-05 DIAGNOSIS — M9908 Segmental and somatic dysfunction of rib cage: Secondary | ICD-10-CM | POA: Diagnosis not present

## 2022-01-05 DIAGNOSIS — M9906 Segmental and somatic dysfunction of lower extremity: Secondary | ICD-10-CM | POA: Diagnosis not present

## 2022-01-05 DIAGNOSIS — M25552 Pain in left hip: Secondary | ICD-10-CM | POA: Diagnosis not present

## 2022-01-19 DIAGNOSIS — M25572 Pain in left ankle and joints of left foot: Secondary | ICD-10-CM | POA: Diagnosis not present

## 2022-01-19 DIAGNOSIS — M9903 Segmental and somatic dysfunction of lumbar region: Secondary | ICD-10-CM | POA: Diagnosis not present

## 2022-01-19 DIAGNOSIS — M545 Low back pain, unspecified: Secondary | ICD-10-CM | POA: Diagnosis not present

## 2022-01-19 DIAGNOSIS — M9908 Segmental and somatic dysfunction of rib cage: Secondary | ICD-10-CM | POA: Diagnosis not present

## 2022-01-19 DIAGNOSIS — M9906 Segmental and somatic dysfunction of lower extremity: Secondary | ICD-10-CM | POA: Diagnosis not present

## 2022-01-19 DIAGNOSIS — M9901 Segmental and somatic dysfunction of cervical region: Secondary | ICD-10-CM | POA: Diagnosis not present

## 2022-01-19 DIAGNOSIS — M542 Cervicalgia: Secondary | ICD-10-CM | POA: Diagnosis not present

## 2022-02-16 DIAGNOSIS — M9903 Segmental and somatic dysfunction of lumbar region: Secondary | ICD-10-CM | POA: Diagnosis not present

## 2022-02-16 DIAGNOSIS — M79672 Pain in left foot: Secondary | ICD-10-CM | POA: Diagnosis not present

## 2022-02-16 DIAGNOSIS — M9905 Segmental and somatic dysfunction of pelvic region: Secondary | ICD-10-CM | POA: Diagnosis not present

## 2022-02-16 DIAGNOSIS — M9908 Segmental and somatic dysfunction of rib cage: Secondary | ICD-10-CM | POA: Diagnosis not present

## 2022-02-16 DIAGNOSIS — M545 Low back pain, unspecified: Secondary | ICD-10-CM | POA: Diagnosis not present

## 2022-02-16 DIAGNOSIS — M9906 Segmental and somatic dysfunction of lower extremity: Secondary | ICD-10-CM | POA: Diagnosis not present

## 2022-03-16 DIAGNOSIS — M79672 Pain in left foot: Secondary | ICD-10-CM | POA: Diagnosis not present

## 2022-03-16 DIAGNOSIS — M9907 Segmental and somatic dysfunction of upper extremity: Secondary | ICD-10-CM | POA: Diagnosis not present

## 2022-03-16 DIAGNOSIS — M546 Pain in thoracic spine: Secondary | ICD-10-CM | POA: Diagnosis not present

## 2022-03-16 DIAGNOSIS — M25512 Pain in left shoulder: Secondary | ICD-10-CM | POA: Diagnosis not present

## 2022-03-16 DIAGNOSIS — M9901 Segmental and somatic dysfunction of cervical region: Secondary | ICD-10-CM | POA: Diagnosis not present

## 2022-04-09 DIAGNOSIS — E639 Nutritional deficiency, unspecified: Secondary | ICD-10-CM | POA: Diagnosis not present

## 2022-04-09 DIAGNOSIS — E613 Manganese deficiency: Secondary | ICD-10-CM | POA: Diagnosis not present

## 2022-04-09 DIAGNOSIS — E531 Pyridoxine deficiency: Secondary | ICD-10-CM | POA: Diagnosis not present

## 2022-04-09 DIAGNOSIS — E559 Vitamin D deficiency, unspecified: Secondary | ICD-10-CM | POA: Diagnosis not present

## 2022-04-23 DIAGNOSIS — D7289 Other specified disorders of white blood cells: Secondary | ICD-10-CM | POA: Diagnosis not present

## 2022-04-23 DIAGNOSIS — R2689 Other abnormalities of gait and mobility: Secondary | ICD-10-CM | POA: Diagnosis not present

## 2022-04-23 DIAGNOSIS — D809 Immunodeficiency with predominantly antibody defects, unspecified: Secondary | ICD-10-CM | POA: Diagnosis not present

## 2022-04-23 DIAGNOSIS — E7800 Pure hypercholesterolemia, unspecified: Secondary | ICD-10-CM | POA: Diagnosis not present

## 2022-04-23 DIAGNOSIS — E559 Vitamin D deficiency, unspecified: Secondary | ICD-10-CM | POA: Diagnosis not present

## 2022-04-23 DIAGNOSIS — D841 Defects in the complement system: Secondary | ICD-10-CM | POA: Diagnosis not present

## 2022-04-23 DIAGNOSIS — E531 Pyridoxine deficiency: Secondary | ICD-10-CM | POA: Diagnosis not present

## 2022-04-23 DIAGNOSIS — R41840 Attention and concentration deficit: Secondary | ICD-10-CM | POA: Diagnosis not present

## 2022-04-27 DIAGNOSIS — M79672 Pain in left foot: Secondary | ICD-10-CM | POA: Diagnosis not present

## 2022-05-18 DIAGNOSIS — R42 Dizziness and giddiness: Secondary | ICD-10-CM | POA: Diagnosis not present

## 2022-05-18 DIAGNOSIS — R001 Bradycardia, unspecified: Secondary | ICD-10-CM | POA: Diagnosis not present

## 2022-05-18 DIAGNOSIS — R270 Ataxia, unspecified: Secondary | ICD-10-CM | POA: Diagnosis not present

## 2022-05-19 DIAGNOSIS — R55 Syncope and collapse: Secondary | ICD-10-CM | POA: Diagnosis not present

## 2022-05-25 ENCOUNTER — Ambulatory Visit (INDEPENDENT_AMBULATORY_CARE_PROVIDER_SITE_OTHER): Payer: PPO

## 2022-05-25 ENCOUNTER — Encounter: Payer: Self-pay | Admitting: Student

## 2022-05-25 ENCOUNTER — Ambulatory Visit: Payer: PPO | Admitting: Student

## 2022-05-25 VITALS — BP 112/68 | HR 61 | Ht 66.0 in | Wt 121.0 lb

## 2022-05-25 DIAGNOSIS — R42 Dizziness and giddiness: Secondary | ICD-10-CM

## 2022-05-25 DIAGNOSIS — R269 Unspecified abnormalities of gait and mobility: Secondary | ICD-10-CM

## 2022-05-25 NOTE — Patient Instructions (Signed)
Medication Instructions:  Your physician recommends that you continue on your current medications as directed. Please refer to the Current Medication list given to you today.  *If you need a refill on your cardiac medications before your next appointment, please call your pharmacy*   Lab Work: None  If you have labs (blood work) drawn today and your tests are completely normal, you will receive your results only by: [place] (if you have MyChart) OR A paper copy in the mail If you have any lab test that is abnormal or we need to change your treatment, we will call you to review the results.   Follow-Up: At [facility], you and your health needs are our priority.  As part of our continuing mission to provide you with exceptional heart care, we have created designated Provider Care Teams.  These Care Teams include your primary Cardiologist (physician) and Advanced Practice Providers (APPs -  Physician Assistants and Nurse Practitioners) who all work together to provide you with the care you need, when you need it.   Your next appointment:   6 month(s)  The format for your next appointment:   In Person  Provider:   [name], MD{     Other Instructions  ZIO XT- Long Term Monitor Instructions  Your physician has requested you wear a ZIO patch monitor for 7 days.  This is a single patch monitor. [employer] supplies one patch monitor per enrollment. Additional stickers are not available. Please do not apply patch if you will be having a Nuclear Stress Test,  Echocardiogram, Cardiac CT, MRI, or Chest Xray during the period you would be wearing the  monitor. The patch cannot be worn during these tests. You cannot remove and re-apply the  ZIO XT patch monitor.  Your ZIO patch monitor will be mailed 3 day USPS to your address on file. It may take 3-5 days  to receive your monitor after you have been enrolled.  Once you have received your monitor, please review the enclosed  instructions. Your monitor  has already been registered assigning a specific monitor serial # to you.  Billing and Patient Assistance Program Information  We have supplied [employer] with any of your insurance information on file for billing purposes. [employer] offers a sliding scale Patient Assistance Program for patients that do not have  insurance, or whose insurance does not completely cover the cost of the ZIO monitor.  You must apply for the Patient Assistance Program to qualify for this discounted rate.  To apply, please call [employer] at [phone], select option 4, select option 2, ask to apply for  Patient Assistance Program. Theodore Demark will ask your household income, and how many people  are in your household. They will quote your out-of-pocket cost based on that information.  [employer] will also be able to set up a 68-month interest-free payment plan if needed.  Applying the monitor   Shave hair from upper left chest.  Hold abrader disc by orange tab. Rub abrader in 40 strokes over the upper left chest as  indicated in your monitor instructions.  Clean area with 4 enclosed alcohol pads. Let dry.  Apply patch as indicated in monitor instructions. Patch will be placed under collarbone on left  side of chest with arrow pointing upward.  Rub patch adhesive wings for 2 minutes. Remove white label marked "1". Remove the white  label marked "2". Rub patch adhesive wings for 2 additional minutes.  While looking in a mirror, press and release  button in center of patch. A small green light will  flash 3-4 times. This will be your only indicator that the monitor has been turned on.  Do not shower for the first 24 hours. You may shower after the first 24 hours.  Press the button if you feel a symptom. You will hear a small click. Record Date, Time and  Symptom in the Patient Logbook.  When you are ready to remove the patch, follow instructions on the last 2 pages of Patient  Logbook. Stick patch  monitor onto the last page of Patient Logbook.  Place Patient Logbook in the blue and white box. Use locking tab on box and tape box closed  securely. The blue and white box has prepaid postage on it. Please place it in the mailbox as  soon as possible. Your physician should have your test results approximately 7 days after the  monitor has been mailed back to [facility].  Call [employer] at [phone] if you have questions regarding  your ZIO XT patch monitor. Call them immediately if you see an orange light blinking on your  monitor.  If your monitor falls off in less than 4 days, contact our Monitor department at [phone].  If your monitor becomes loose or falls off after 4 days call [employer] at [phone] for  suggestions on securing your monitor

## 2022-05-25 NOTE — Progress Notes (Unsigned)
Enrolled for [employer] to mail a ZIO XT long term holter monitor to the patients address on file.   Dr. Klein to read. 

## 2022-05-25 NOTE — Progress Notes (Signed)
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   Pt was seen in [place] ED last week. Pt states she drank excessively the previous Friday night. She woke up on Saturday with dizziness and dys-equilibrium, but was able to "climb a mountain" and hike with only mild SOB on hills.  She continued to feel "off" through Monday, and went to ED for work up, which was unremarkable, including CT and MRI. She was concerned she may have had paroxysmal AF and thrown a clot.  She has continued to feel as if she is leaning one way. No syncope.  She has a PCP she follows distantly in [place] who is aware of symptoms, but has not seen her. She does not have a neurologist. She has not had prolonged palpitations.   Past Medical History:  Diagnosis Date   Anxiety    Arthritis    Family history of breast cancer    Family history of melanoma    Family history of pancreatic cancer    History of basal cell carcinoma ([place]) excision 2014   left mid back   History of fungal infection 2018   systemic fungal infection due to black mold exposure   Hyperlipidemia    Hypothyroidism    followed by pcp   Irregular heartbeat    per pt asymptomatic ;   last cardiology visit w/ dr Caryl Comes 07-03-2020 for abnormal EKG, Atrial ectopy/ PACs w/ NS atrial tachycardia; work-up results in epic,  event monitor 04/ 2019 showed SR with PACs,  normal echo 04/ 2019, nuclear stress test showed normal no ischemia normal LVF nuclear ef 70%   Malignant neoplasm of upper-outer quadrant of left breast in female, estrogen receptor positive (Whitney [name]) 08/2017   oncologist--- dr [name]---  left breast DCIS , ER/PR+;   S/P left mastectomy w/ node dissestion and right prophalox masterectomy 09-06-2017,  no chemo/ radiation   Neck injury    05-16-2021  per pt had neck injury approx. 3 months ago,  had epi injeciton  approx one month ago, followed by dr [name],  no rom restriction but do not hyperextend neck   Wears contact lenses    Past Surgical History:  Procedure Laterality Date   [place] Bilateral 09/06/2017   Procedure: LEFT MASTECTOMY WITH LEFT SENTINEL LYMPH NODE BIOPSY AND  RIGHT PROPHYLACTIC MASTECTOMY;  Surgeon: [name], MD;  Location: [facility];  Service: General;  Laterality: Bilateral;   OOPHORECTOMY Bilateral    ROBOTIC ASSISTED SALPINGO OOPHERECTOMY Bilateral 05/20/2021   Procedure: XI ROBOTIC ASSISTED SALPINGO OOPHORECTOMY;  Surgeon: [name], MD;  Location: [facility];  Service: Gynecology;  Laterality: Bilateral;   TONSILLECTOMY AND ADENOIDECTOMY     child    Current Outpatient Medications  Medication Sig Dispense Refill   Emollient (DHEA EX) Apply 40 mg topically as needed (2 cc daily). vaginal     levothyroxine (SYNTHROID) 75 MCG tablet Take 75 mcg by mouth every morning.     liothyronine (CYTOMEL) 25 MCG tablet Take 12.5 mcg by mouth daily.     Multiple Vitamins-Minerals (MULTIVITAMIN WITH MINERALS) tablet Take 1 tablet by mouth daily.     traZODone (DESYREL) 100 MG tablet Take 1 tablet (100 mg total) by mouth at bedtime.     voriconazole (  VFEND) 200 MG tablet SMARTSIG:1 Tablet(s) By Mouth     No current facility-administered medications for this visit.    Allergies  Allergen Reactions   Sulfa Antibiotics Rash    Social History   Socioeconomic History   Marital status: Married    Spouse name: Not on file   Number of children: Not on file   Years of education: Not on file   Highest education level: Not on file  Occupational History   Not on file  Tobacco Use   Smoking status: Former    Packs/day: 1.00    Years: 10.00    Total pack years: 10.00    Types: Cigarettes    Quit date: 55    Years since quitting: 32.5   Smokeless tobacco: Never  Vaping Use    Vaping Use: Never used  Substance and Sexual Activity   Alcohol use: Yes    Comment: rarely   Drug use: No   Sexual activity: Not on file  Other Topics Concern   Not on file  Social History Narrative   Former internal medicine primary care provider.     Now currently runs a nutrition supplement business online.   Now essentially medically retired due to black mold infestation.   She lives alone, but has a new significant other.   She has 2 cats that live with her.   She quit smoking in 1991.  She rarely drinks alcohol.   She is quite active.  Was a former runner.  Now that she is recovered from the black mold issues she is now doing cycling and interval training with run-walk..   Social Determinants of Health   Financial Resource Strain: Not on file  Food Insecurity: Not on file  Transportation Needs: Not on file  Physical Activity: Not on file  Stress: Not on file  Social Connections: Not on file  Intimate Partner Violence: Not on file     Review of Systems: All other systems reviewed and are otherwise negative except as noted above.  Physical Exam: Vitals:   05/25/22 0823  BP: 112/68  Pulse: 61  SpO2: 97%  Weight: 121 lb (54.9 kg)  Height: '5\' 6"'$  (1.676 m)    GEN- The patient is well appearing, alert and oriented x 3 today.   HEENT: normocephalic, atraumatic; sclera clear, conjunctiva pink; hearing intact; oropharynx clear; neck supple, no JVP Lymph- no cervical lymphadenopathy Lungs- Clear to ausculation bilaterally, normal work of breathing.  No wheezes, rales, rhonchi Heart- Regular rate and rhythm, no murmurs, rubs or gallops, PMI not laterally displaced GI- soft, non-tender, non-distended, bowel sounds present, no hepatosplenomegaly Extremities- no clubbing, cyanosis, or edema; DP/PT/radial pulses 2+ bilaterally MS- no significant deformity or atrophy Skin- warm and dry, no rash or lesion Psych- euthymic mood, full affect Neuro- strength and sensation are  intact  EKG is ordered. Personal review of EKG from today shows NSR with brief run of atrial tachycardia, overall rate in 60s.   Additional studies reviewed include: Previous EP office notes.   Assessment and Plan:  Abnormal ECG   Hypercholesterolemia   Atrial ectopy-PACs and nonsustained atrial tachycardia   Breast cancer status postmastectomy   Black mold exposure  ETOH over-use Dizziness Abnormal Gait  EKG today with NSR, PACs, and brief AT. She worries she may have had persistent AF and possibly thrown a clot. No persistent AF has yet been noted, thus she presents with desire for monitoring.  Place 7 day Zio.   It is unlikely  gait changes are related to cardiac issue. We will recommend referral to Neurology for further work up of her imbalance and dizziness.   Further cardiac plan pending monitor.   Symptoms manifested in setting of ETOH use. Encouraged ongoing abstinence.   Follow up with Dr. Caryl Comes in 6 months, sooner with abnormal monitor (as long as findings are new and not consistent with her previous PACs/nonsustained AT)  Shirley Friar, PA-C  05/25/22 9:17 AM

## 2022-05-31 DIAGNOSIS — R42 Dizziness and giddiness: Secondary | ICD-10-CM | POA: Diagnosis not present

## 2022-06-09 ENCOUNTER — Other Ambulatory Visit: Payer: Self-pay

## 2022-06-09 DIAGNOSIS — R42 Dizziness and giddiness: Secondary | ICD-10-CM

## 2022-06-09 DIAGNOSIS — R269 Unspecified abnormalities of gait and mobility: Secondary | ICD-10-CM

## 2022-06-12 ENCOUNTER — Encounter: Payer: Self-pay | Admitting: Neurology

## 2022-06-15 DIAGNOSIS — R42 Dizziness and giddiness: Secondary | ICD-10-CM | POA: Diagnosis not present

## 2022-07-17 NOTE — Progress Notes (Unsigned)
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 Although her history is well known to you, please allow [place] to reiterate it for the purpose of our medical record. The patient was accompanied to the clinic by *** who also provides collateral information.     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 ***-handed female with hypothyroidism, left breast cancer (2018) s/p *** presenting for evaluation of imbalance and dizziness.   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  There is an old scar from prior injury overlying the medial forearm, which most likely resulted in these findings. Correlate clinically.   There is no evidence of carpal tunnel syndrome, radial neuropathy, or cervical radiculopathy affecting the right side.   The left upper extremity was not tested, at patients request.   Past Medical History:  Diagnosis Date   Anxiety    Arthritis    Family history of breast cancer    Family history of melanoma    Family history of pancreatic cancer    History of basal cell carcinoma ([place]) excision 2014   left mid back   History of fungal infection 2018   systemic fungal infection due to black mold exposure   Hyperlipidemia    Hypothyroidism    followed by pcp   Irregular heartbeat    per pt asymptomatic ;   last cardiology visit w/ dr Caryl Comes 07-03-2020 for abnormal EKG, Atrial ectopy/ PACs w/ NS atrial tachycardia; work-up results  in epic,  event monitor 04/ 2019 showed SR with PACs,  normal echo 04/ 2019, nuclear stress test showed normal no ischemia normal LVF nuclear ef 70%   Malignant neoplasm of upper-outer quadrant of left breast in female, estrogen receptor positive ([place]) 08/2017   oncologist--- dr [name]---  left breast DCIS , ER/PR+;   S/P left mastectomy w/ node dissestion and right prophalox masterectomy 09-06-2017,  no chemo/ radiation   Neck injury    05-16-2021  per pt had neck injury approx. 3 months ago,  had epi injeciton approx one month ago, followed by dr [name],  no rom restriction but do not hyperextend neck   Wears contact lenses     Past Surgical History:  Procedure Laterality Date   [place] Bilateral 09/06/2017   Procedure: LEFT MASTECTOMY WITH LEFT SENTINEL LYMPH NODE BIOPSY AND  RIGHT PROPHYLACTIC MASTECTOMY;  Surgeon: [name], MD;  Location: [facility];  Service: General;  Laterality: Bilateral;   OOPHORECTOMY Bilateral    ROBOTIC ASSISTED SALPINGO OOPHERECTOMY Bilateral 05/20/2021   Procedure: XI ROBOTIC ASSISTED SALPINGO OOPHORECTOMY;  Surgeon: [name], MD;  Location: [facility];  Service: Gynecology;  Laterality: Bilateral;   TONSILLECTOMY AND ADENOIDECTOMY     child     Medications:  Outpatient Encounter Medications as of 07/20/2022  Medication Sig   Emollient (DHEA EX) Apply 40 mg topically as needed (2 cc daily). vaginal   levothyroxine (SYNTHROID) 75 MCG tablet Take 75 mcg by mouth  every morning.   liothyronine (CYTOMEL) 25 MCG tablet Take 12.5 mcg by mouth daily.   Multiple Vitamins-Minerals (MULTIVITAMIN WITH MINERALS) tablet Take 1 tablet by mouth daily.   traZODone (DESYREL) 100 MG tablet Take 1 tablet (100 mg total) by mouth at bedtime.   voriconazole (VFEND) 200 MG tablet SMARTSIG:1 Tablet(s) By Mouth   No facility-administered encounter medications on file as of  07/20/2022.    Allergies:  Allergies  Allergen Reactions   Sulfa Antibiotics Rash    Family History: Family History  Problem Relation Age of Onset   Hyperlipidemia Mother    Breast cancer Mother        bilateral mastectomy   Melanoma Mother 21   Hyperlipidemia Father    Prostate cancer Father 70   Lung cancer Father    Birth defects Maternal Grandmother    Breast cancer Maternal Grandmother        dx in her 17s   Birth defects Paternal Grandmother    Congestive Heart Failure Paternal Grandmother    Heart disease Sister        short QT   Other Sister        ATM+   Lung cancer Maternal Grandfather    Heart attack Paternal Grandfather 18   Cancer Sister        Anal cancer   Other Sister        ATM-   Healthy Sister    Other Maternal Aunt        double mastectomy - unsure if had breast cancer   Breast cancer Other    Pancreatic cancer Other 100    Social History: Social History   Tobacco Use   Smoking status: Former    Packs/day: 1.00    Years: 10.00    Total pack years: 10.00    Types: Cigarettes    Quit date: 1991    Years since quitting: 32.7   Smokeless tobacco: Never  Vaping Use   Vaping Use: Never used  Substance Use Topics   Alcohol use: Yes    Comment: rarely   Drug use: No   Social History   Social History Narrative   Former internal medicine primary care provider.     Now currently runs a nutrition supplement business online.   Now essentially medically retired due to black mold infestation.   She lives alone, but has a new significant other.   She has 2 cats that live with her.   She quit smoking in 1991.  She rarely drinks alcohol.   She is quite active.  Was a former runner.  Now that she is recovered from the black mold issues she is now doing cycling and interval training with run-walk..    Vital Signs:  There were no vitals taken for this visit.   General Medical Exam:  *** General:  Well appearing, comfortable.   Eyes/ENT: see  cranial nerve examination.   Neck:   No carotid bruits. Respiratory:  Clear to auscultation, good air entry bilaterally.   Cardiac:  Regular rate and rhythm, no murmur.   Extremities:  No deformities, edema, or skin discoloration.  Skin:  No rashes or lesions.  Neurological Exam: MENTAL STATUS including orientation to time, place, person, recent and remote memory, attention span and concentration, language, and fund of knowledge is ***normal.  Speech is not dysarthric.  CRANIAL NERVES: II:  No visual field defects.  Unremarkable fundi.   III-IV-VI: Pupils equal round and reactive to light.  Normal  conjugate, extra-ocular eye movements in all directions of gaze.  No nystagmus.  No ptosis***.   V:  Normal facial sensation.    VII:  Normal facial symmetry and movements.   VIII:  Normal hearing and vestibular function.   IX-X:  Normal palatal movement.   XI:  Normal shoulder shrug and head rotation.   XII:  Normal tongue strength and range of motion, no deviation or fasciculation.  MOTOR:  No atrophy, fasciculations or abnormal movements.  No pronator drift.   Upper Extremity:  Right  Left  Deltoid  5/5   5/5   Biceps  5/5   5/5   Triceps  5/5   5/5   Infraspinatus 5/5  5/5  Medial pectoralis 5/5  5/5  Wrist extensors  5/5   5/5   Wrist flexors  5/5   5/5   Finger extensors  5/5   5/5   Finger flexors  5/5   5/5   Dorsal interossei  5/5   5/5   Abductor pollicis  5/5   5/5   Tone (Ashworth scale)  0  0   Lower Extremity:  Right  Left  Hip flexors  5/5   5/5   Hip extensors  5/5   5/5   Adductor 5/5  5/5  Abductor 5/5  5/5  Knee flexors  5/5   5/5   Knee extensors  5/5   5/5   Dorsiflexors  5/5   5/5   Plantarflexors  5/5   5/5   Toe extensors  5/5   5/5   Toe flexors  5/5   5/5   Tone (Ashworth scale)  0  0   MSRs:  Right        Left                  brachioradialis 2+  2+  biceps 2+  2+  triceps 2+  2+  patellar 2+  2+  ankle jerk 2+  2+  Hoffman no  no  plantar  response down  down   SENSORY:  Normal and symmetric perception of light touch, pinprick, vibration, and proprioception.  Romberg's sign absent.   COORDINATION/GAIT: Normal finger-to- nose-finger and heel-to-shin.  Intact rapid alternating movements bilaterally.  Able to rise from a chair without using arms.  Gait narrow based and stable. Tandem and stressed gait intact.    ***   Thank you for allowing me to participate in patient's care.  If I can answer any additional questions, I would be pleased to do so.    Sincerely,    Ceili Boshers K. Posey Pronto, DO

## 2022-07-20 ENCOUNTER — Encounter: Payer: Self-pay | Admitting: Neurology

## 2022-07-20 ENCOUNTER — Ambulatory Visit: Payer: PPO | Admitting: Neurology

## 2022-07-20 VITALS — BP 120/65 | HR 72 | Ht 66.0 in | Wt 124.0 lb

## 2022-07-20 DIAGNOSIS — H832X9 Labyrinthine dysfunction, unspecified ear: Secondary | ICD-10-CM

## 2022-07-20 NOTE — Patient Instructions (Signed)
Vestibular therapy Try reduce vitamin B6

## 2022-08-10 ENCOUNTER — Encounter: Payer: Self-pay | Admitting: Physical Therapy

## 2022-08-10 ENCOUNTER — Other Ambulatory Visit: Payer: Self-pay

## 2022-08-10 ENCOUNTER — Ambulatory Visit: Payer: PPO | Attending: Neurology | Admitting: Physical Therapy

## 2022-08-10 DIAGNOSIS — R42 Dizziness and giddiness: Secondary | ICD-10-CM | POA: Insufficient documentation

## 2022-08-10 DIAGNOSIS — H832X9 Labyrinthine dysfunction, unspecified ear: Secondary | ICD-10-CM | POA: Insufficient documentation

## 2022-08-10 DIAGNOSIS — R2681 Unsteadiness on feet: Secondary | ICD-10-CM | POA: Insufficient documentation

## 2022-08-10 NOTE — Therapy (Signed)
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 DOB:1951-02-03, 71 y.o., female Today's Date: 08/10/2022  PCP: No current local PCP-pt working to establish REFERRING PROVIDER: [name], DO   PT End of Session - 08/10/22 1750     Visit Number 1    Number of Visits 10    Date for PT Re-Evaluation 09/11/22    Authorization Type [employer]    PT Start Time 1405    PT Stop Time 1452    PT Time Calculation (min) 47 min    Activity Tolerance Patient tolerated treatment well    Behavior During Therapy [facility] for tasks assessed/performed             Past Medical History:  Diagnosis Date   Anxiety    Arthritis    Family history of breast cancer    Family history of melanoma    Family history of pancreatic cancer    History of basal cell carcinoma ([place]) excision 2014   left mid back   History of fungal infection 2018   systemic fungal infection due to black mold exposure   Hyperlipidemia    Hypothyroidism    followed by pcp   Irregular heartbeat    per pt asymptomatic ;   last cardiology visit w/ dr Caryl Comes 07-03-2020 for abnormal EKG, Atrial ectopy/ PACs w/ NS atrial tachycardia; work-up results in epic,  event monitor 04/ 2019 showed SR with PACs,  normal echo 04/ 2019, nuclear stress test showed normal no ischemia normal LVF nuclear ef 70%   Malignant neoplasm of upper-outer quadrant of left breast in female, estrogen receptor positive ([place]) 08/2017   oncologist--- dr [name]---  left breast DCIS , ER/PR+;   S/P left mastectomy w/ node dissestion and right prophalox masterectomy 09-06-2017,  no chemo/ radiation   Neck injury    05-16-2021  per pt had neck injury approx. 3 months ago,  had epi injeciton approx one month ago, followed by dr [name],  no rom restriction but do not hyperextend neck   Wears contact lenses    Past Surgical History:  Procedure Laterality Date   [place] Bilateral 09/06/2017   Procedure: LEFT MASTECTOMY WITH LEFT SENTINEL LYMPH NODE BIOPSY AND  RIGHT PROPHYLACTIC MASTECTOMY;  Surgeon: [name], MD;  Location: [facility];  Service: General;  Laterality: Bilateral;   OOPHORECTOMY Bilateral    ROBOTIC ASSISTED SALPINGO OOPHERECTOMY Bilateral 05/20/2021   Procedure: XI ROBOTIC ASSISTED SALPINGO OOPHORECTOMY;  Surgeon: [name], MD;  Location: [facility];  Service: Gynecology;  Laterality: Bilateral;   TONSILLECTOMY AND ADENOIDECTOMY     child   Patient Active Problem List   Diagnosis Date Noted   Changing skin lesion 11/11/2021   Basal cell carcinoma 08/12/2021   Atrial tachycardia (HCC) - non sustained 08/06/2021   PAC (premature atrial contraction) 08/06/2021   Preop cardiovascular exam 11/14/2019   Hyperlipidemia with target LDL less than 130 11/13/2019   Family history of breast cancer    Family history of melanoma    Family history of pancreatic cancer    Patellofemoral arthritis of right knee 02/28/2018   Palpitations 02/10/2018   Abnormal finding on EKG 02/10/2018   Breast cancer in situ 09/06/2017   Ductal carcinoma in situ (DCIS) of left breast 08/19/2017   Malignant neoplasm of upper-outer quadrant of left breast in female, estrogen  receptor positive ([place]) 08/17/2017   Low back pain 12/02/2016   Left hamstring injury 11/01/2013   Nonallopathic lesion of cervical region 11/01/2013   Nonallopathic lesion of lumbosacral region 11/01/2013   Nonallopathic lesion of thoracic region 11/01/2013    ONSET DATE: 07/20/2022 (MD referral)  REFERRING DIAG: H83.2X9 (ICD-10-CM) - Vestibular disequilibrium, unspecified laterality  THERAPY DIAG:  Dizziness and giddiness  Unsteadiness on feet  Rationale for Evaluation and Treatment Rehabilitation  SUBJECTIVE:   SUBJECTIVE STATEMENT: Enjoys ballroom dancing and have not been able to do this in  about 2 months.  Getting back to doing this.  Still having some dysequilibrium.  Turning too quickly makes me stumble Pt accompanied by: self  PERTINENT HISTORY:  hypothyroidism and left breast cancer (2018), arthritis, anxiety, systemic fungal infection due to black mold exposure   PAIN:  Are you having pain? No  PRECAUTIONS: Fall  WEIGHT BEARING RESTRICTIONS No  FALLS: Has patient fallen in last 6 months? Yes. Number of falls 1  LIVING ENVIRONMENT: Lives with: lives with their spouse Lives in: House/apartment Stairs: Yes: Internal: 13 steps; on right going up and External: 1 steps; none Has following equipment at home: None  PLOF: Independent and Leisure: Enjoys ballroom dancing, hiking, biking; works out regularly  [place] rid of sense of dysequilibrium  OBJECTIVE:   DIAGNOSTIC FINDINGS: CT head 05/18/2022: Mild age-related atrophy.  Calcified extra-axial mass along the LEFT lateral aspect of the frontal falx, 7 x 11 x 10 mm, favor calcified meningioma.  No acute intracranial abnormalities.   COGNITION: Overall cognitive status: Within functional limits for tasks assessed   POSTURE: No Significant postural limitations   Cervical ROM:   WFL for rotation and flexion/extension Active A/PROM (deg) eval  Flexion   Extension   Right lateral flexion   Left lateral flexion   Right rotation   Left rotation   (Blank rows = not tested)   BED MOBILITY:  Independent  TRANSFERS: Independent  GAIT: Gait pattern:  guarded gait pattern, slowed pace Distance walked: clinic distances Assistive device utilized: None Level of assistance: Complete Independence   PATIENT SURVEYS:  FOTO 48 at intake (predicted score 58)   VESTIBULAR ASSESSMENT   GENERAL OBSERVATION: No acute distress, does report some anxiousness about this ongoing disequilibrium    SYMPTOM BEHAVIOR:   Subjective history: "Drank way too much on one evening, and then just didn't feel right  several days later".  No c/o room spinning type dizziness.  Maybe very transiently.  Bending over makes it worse, when I come up   Non-Vestibular symptoms:  None   Type of dizziness: Imbalance (Disequilibrium) and Unsteady with head/body turns   Frequency: Several times per day   Duration: seconds   Aggravating factors: Induced by motion: bending down to the ground, turning body quickly, and turning head quickly   Relieving factors: slow movements and hold onto something   Progression of symptoms: better   OCULOMOTOR EXAM:   Ocular Alignment: normal   Ocular ROM: No Limitations   Spontaneous Nystagmus: absent   Gaze-Induced Nystagmus: absent and (for horizontal); noted nystagmus upon return to center from downward vertical gaze   Smooth Pursuits: intact   Saccades: intact and for horizontal movements; nystagmus upon return to center from downward vertical gaze   Convergence/Divergence: reports seeing shadow; decreased convergence distance       VESTIBULAR - OCULAR REFLEX:    Slow VOR: Normal   VOR Cancellation: Normal   Head-Impulse Test: HIT Right: positive  Dynamic Visual Acuity: Static: Line 6 Dynamic: Line 5    POSITIONAL TESTING: Right Dix-Hallpike: no nystagmus and c/o minor dizziness, goes away quickly Left Dix-Hallpike: no nystagmus Right Roll Test: no nystagmus and no symptoms Left Roll Test: no nystagmus and reports feels different, but no reports of spinning  With L Dix-hallpike, pt reports double vision and appears that R eye has downward rotational beat  M-CTSIB  Condition 1: Firm Surface, EO 30 Sec, Normal Sway  Condition 2: Firm Surface, EC 15 Sec, Severe Sway  Condition 3: Foam Surface, EO 30 Sec, Mild Sway  Condition 4: Foam Surface, EC 30 Sec, Moderate and Severe Sway    MOTION SENSITIVITY:    Motion Sensitivity Quotient  Intensity: 0 = none, 1 = Lightheaded, 2 = Mild, 3 = Moderate, 4 = Severe, 5 = Vomiting  Intensity  1. Sitting to supine   2. Supine  to L side   3. Supine to R side   4. Supine to sitting   5. L Hallpike-Dix   6. Up from L    7. R Hallpike-Dix   8. Up from R    9. Sitting, head  tipped to L knee   10. Head up from L  knee   11. Sitting, head  tipped to R knee   12. Head up from R  knee   13. Sitting head turns x5   14.Sitting head nods x5   15. In stance, 180  turn to L    16. In stance, 180  turn to R        VESTIBULAR TREATMENT:  Gaze Adaptation:   x1 Viewing Horizontal: Position: seated, Time: 30 sec, and Comment: no c/o and x1 Viewing Vertical:  Position: seated, Time: 30 sec, and Comment: no c/o   PATIENT EDUCATION: Education details: PT eval results, POC and initiation of HEP Person educated: Patient Education method: Explanation, Demonstration, Verbal cues, and Handouts Education comprehension: verbalized understanding, returned demonstration, and needs further education  Access Code: [email or web address] URL: [email or web address] Date: 08/10/2022 Prepared by: [facility]  Exercises - Seated Gaze Stabilization with Head Rotation  - 2 x daily - 7 x weekly - 2 sets - 30 sec hold - Seated Gaze Stabilization with Head Nod  - 2 x daily - 7 x weekly - 2 sets - 30 sec hold  GOALS: Goals reviewed with patient? Yes  SHORT TERM GOALS:= LTGs    LONG TERM GOALS: Target date: 09/11/2022   Pt will be independent with HEP for improved balance and decreased c/o dizziness/unsteadiness. Baseline:  Goal status: INITIAL  2.  FOTO score to improve to 58 to demo improved overall functional mobility. Baseline: 48 Goal status: INITIAL  3.  Improve conditions 2 and 4 on MCTSIB to 30 seconds with mild/moderate sway for improved vestibular system use for balance. Baseline: 15-30 sec mod-severe sway Goal status: INITIAL  4.  DGI to be assessed and goal to be written as appropriate. Baseline:  Goal status: INITIAL  ASSESSMENT:  CLINICAL IMPRESSION: Patient  is a 71 y.o. female who was seen today for physical therapy evaluation and treatment for vestibular dysequilibrium.  She has several month history of unsteadiness and dysequilibrium.  She reports more unsteadiness when bending down and returning to upright positioning, with nystagmus noted with this movement today.  Positional testing performed today, with no nystagmus noted, but pt does report double vision with L Dix-Hallpike position.  She demonstrates decreased vestibular system  use for balance, reliance on visual system, with difficulty on MCTSIB test.  She does report that balance and unsteadiness is improving, but this is limiting her from full participation in ballroom dancing and other fitness/outdoor activities.  She will benefit from skilled PT to address deficits related to balance, vestibular system for improved overall return to functional mobility and decreased fall risk.   OBJECTIVE IMPAIRMENTS Abnormal gait, decreased balance, decreased mobility, difficulty walking, and dizziness.   ACTIVITY LIMITATIONS bending, standing, squatting, bed mobility, and locomotion level  PARTICIPATION LIMITATIONS: community activity and leisure activities  PERSONAL FACTORS  see [place] above  are also affecting patient's functional outcome.   REHAB POTENTIAL: Good  CLINICAL DECISION MAKING: Evolving/moderate complexity  EVALUATION COMPLEXITY: Moderate   PLAN: PT FREQUENCY: 2x/week  PT DURATION: other: 5 weeks, including eval week  PLANNED INTERVENTIONS: Therapeutic exercises, Therapeutic activity, Neuromuscular re-education, Balance training, Gait training, Patient/Family education, Self Care, Vestibular training, and Canalith repositioning  PLAN FOR NEXT SESSION: Review and progress HEP for gaze stabilization and habituation;  ask questions for motion sensitivity quotient; assess DGI. Will need to work on compliant surfaces and ankle/hip strategy work   [employer] W., PT 08/10/2022, 5:51 PM

## 2022-08-14 ENCOUNTER — Ambulatory Visit: Payer: PPO | Admitting: Physical Therapy

## 2022-08-14 ENCOUNTER — Encounter: Payer: Self-pay | Admitting: Physical Therapy

## 2022-08-14 DIAGNOSIS — R42 Dizziness and giddiness: Secondary | ICD-10-CM | POA: Diagnosis not present

## 2022-08-14 DIAGNOSIS — R2681 Unsteadiness on feet: Secondary | ICD-10-CM

## 2022-08-14 NOTE — Therapy (Signed)
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 DOB:1951-10-22, 71 y.o., female Today's Date: 08/14/2022  PCP: No current local PCP-pt working to establish REFERRING PROVIDER: [name], DO   PT End of Session - 08/14/22 1059     Visit Number 2    Number of Visits 10    Date for PT Re-Evaluation 09/11/22    Authorization Type [employer]    PT Start Time 1059    PT Stop Time 1143    PT Time Calculation (min) 44 min    Activity Tolerance Patient tolerated treatment well    Behavior During Therapy [facility] for tasks assessed/performed              Past Medical History:  Diagnosis Date   Anxiety    Arthritis    Family history of breast cancer    Family history of melanoma    Family history of pancreatic cancer    History of basal cell carcinoma ([place]) excision 2014   left mid back   History of fungal infection 2018   systemic fungal infection due to black mold exposure   Hyperlipidemia    Hypothyroidism    followed by pcp   Irregular heartbeat    per pt asymptomatic ;   last cardiology visit w/ dr Caryl Comes 07-03-2020 for abnormal EKG, Atrial ectopy/ PACs w/ NS atrial tachycardia; work-up results in epic,  event monitor 04/ 2019 showed SR with PACs,  normal echo 04/ 2019, nuclear stress test showed normal no ischemia normal LVF nuclear ef 70%   Malignant neoplasm of upper-outer quadrant of left breast in female, estrogen receptor positive ([place]) 08/2017   oncologist--- dr [name]---  left breast DCIS , ER/PR+;   S/P left mastectomy w/ node dissestion and right prophalox masterectomy 09-06-2017,  no chemo/ radiation   Neck injury    05-16-2021  per pt had neck injury approx. 3 months ago,  had epi injeciton approx one month ago, followed by dr [name],  no rom restriction but do not hyperextend neck   Wears contact lenses    Past Surgical History:  Procedure Laterality Date   [place] Bilateral 09/06/2017   Procedure: LEFT MASTECTOMY WITH LEFT SENTINEL LYMPH NODE BIOPSY AND  RIGHT PROPHYLACTIC MASTECTOMY;  Surgeon: [name], MD;  Location: [place];  Service: General;  Laterality: Bilateral;   OOPHORECTOMY Bilateral    ROBOTIC ASSISTED SALPINGO OOPHERECTOMY Bilateral 05/20/2021   Procedure: XI ROBOTIC ASSISTED SALPINGO OOPHORECTOMY;  Surgeon: [name], MD;  Location: [place];  Service: Gynecology;  Laterality: Bilateral;   TONSILLECTOMY AND ADENOIDECTOMY     child   Patient Active Problem List   Diagnosis Date Noted   Changing skin lesion 11/11/2021   Basal cell carcinoma 08/12/2021   Atrial tachycardia (HCC) - non sustained 08/06/2021   PAC (premature atrial contraction) 08/06/2021   Preop cardiovascular exam 11/14/2019   Hyperlipidemia with target LDL less than 130 11/13/2019   Family history of breast cancer    Family history of melanoma    Family history of pancreatic cancer    Patellofemoral arthritis of right knee 02/28/2018   Palpitations 02/10/2018   Abnormal finding on EKG 02/10/2018   Breast cancer in situ 09/06/2017   Ductal carcinoma in situ (DCIS) of left breast 08/19/2017   Malignant neoplasm of upper-outer quadrant of left breast in  female, estrogen receptor positive ([place]) 08/17/2017   Low back pain 12/02/2016   Left hamstring injury 11/01/2013   Nonallopathic lesion of cervical region 11/01/2013   Nonallopathic lesion of lumbosacral region 11/01/2013   Nonallopathic lesion of thoracic region 11/01/2013    ONSET DATE: 07/20/2022 (MD referral)  REFERRING DIAG: H83.2X9 (ICD-10-CM) - Vestibular disequilibrium, unspecified laterality  THERAPY DIAG:  Dizziness and giddiness  Unsteadiness on feet  Rationale for Evaluation and Treatment Rehabilitation  SUBJECTIVE:   SUBJECTIVE STATEMENT: Have noticed that when I roll out of the bed to the left, I'm  more unsteady.  Did do the exercises. Pt accompanied by: self  PERTINENT HISTORY:  hypothyroidism and left breast cancer (2018), arthritis, anxiety, systemic fungal infection due to black mold exposure   PAIN:  Are you having pain? No  PRECAUTIONS: Fall  WEIGHT BEARING RESTRICTIONS No  FALLS: Has patient fallen in last 6 months? Yes. Number of falls 1  LIVING ENVIRONMENT: Lives with: lives with their spouse Lives in: House/apartment Stairs: Yes: Internal: 13 steps; on right going up and External: 1 steps; none Has following equipment at home: None  PLOF: Independent and Leisure: Enjoys ballroom dancing, hiking, biking; works out regularly  [place] rid of sense of dysequilibrium  OBJECTIVE:   TODAY'S TREATMENT: 08/14/2022 Activity Comments  FGA 15/30 See below (Scores <22/30 indicate increased fall risk)  Reviewed HEP from last visit Pt return demo understanding; no symptoms in vertical direction  Brandt-Daroff x 3 reps to L side Rates as 3/10 1st trial>2/10  Standing gaze stabilization horizontal and vertical, x 30 seconds Rates as 2-3/10 dysequilibrium  Quarter turns to look to target, x 3 reps; then head turn>body turn to target x 3 reps Less unsteadiness with head>body turn  Gait straight line 200 ft, looking at target ahead    Motion Senstivity Quotient questions asked-see below   [facility] PT Assessment - 08/14/22 0001       Functional Gait  Assessment   Gait assessed  Yes    Gait Level Surface Walks 20 ft, slow speed, abnormal gait pattern, evidence for imbalance or deviates 10-15 in outside of the 12 in walkway width. Requires more than 7 sec to ambulate 20 ft.   7.56   Change in Gait Speed Able to change speed, demonstrates mild gait deviations, deviates 6-10 in outside of the 12 in walkway width, or no gait deviations, unable to achieve a major change in velocity, or uses a change in velocity, or uses an assistive device.   feels moving to the left   Gait with  Horizontal Head Turns Performs head turns with moderate changes in gait velocity, slows down, deviates 10-15 in outside 12 in walkway width but recovers, can continue to walk.   11.45 sec   Gait with Vertical Head Turns Performs task with moderate change in gait velocity, slows down, deviates 10-15 in outside 12 in walkway width but recovers, can continue to walk.   8.44 sec   Gait and Pivot Turn Turns slowly, requires verbal cueing, or requires several small steps to catch balance following turn and stop   3.06   Step Over Obstacle Is able to step over one shoe box (4.5 in total height) but must slow down and adjust steps to clear box safely. May require verbal cueing.    Gait with Narrow Base of Support Is able to ambulate for 10 steps heel to toe with no staggering.    Gait with Eyes Closed Walks 20 ft,  slow speed, abnormal gait pattern, evidence for imbalance, deviates 10-15 in outside 12 in walkway width. Requires more than 9 sec to ambulate 20 ft.   16.84   Ambulating Backwards Walks 20 ft, uses assistive device, slower speed, mild gait deviations, deviates 6-10 in outside 12 in walkway width.   20.28   Steps Alternating feet, must use rail.    Total Score 15    FGA comment: Scores <22/30 indicate increased fall risk             Access Code: [email or web address] URL: [email or web address] Date: 08/14/2022 Prepared by: [facility]  Exercises - Seated Gaze Stabilization with Head Rotation  - 2 x daily - 7 x weekly - 2 sets - 30 sec hold - Brandt-Daroff Vestibular Exercise  - 1-2 x daily - 7 x weekly - 1 sets - 3 reps - Standing Gaze Stabilization with Head Rotation  - 2 x daily - 7 x weekly - 1-2 sets - 30 sec hold - Standing Gaze Stabilization with Head Nod  - 2 x daily - 7 x weekly - 1-2 sets - 30 sec hold  PATIENT EDUCATION: Education details: HEP additions Person educated: Patient Education method: Explanation, Demonstration, and  Handouts Education comprehension: verbalized understanding and returned demonstration  ------------------------------------------------------------------------ Objective measures below from evaluation: DIAGNOSTIC FINDINGS: CT head 05/18/2022: Mild age-related atrophy.  Calcified extra-axial mass along the LEFT lateral aspect of the frontal falx, 7 x 11 x 10 mm, favor calcified meningioma.  No acute intracranial abnormalities.   COGNITION: Overall cognitive status: Within functional limits for tasks assessed   POSTURE: No Significant postural limitations   Cervical ROM:   WFL for rotation and flexion/extension Active A/PROM (deg) eval  Flexion   Extension   Right lateral flexion   Left lateral flexion   Right rotation   Left rotation   (Blank rows = not tested)   BED MOBILITY:  Independent  TRANSFERS: Independent  GAIT: Gait pattern:  guarded gait pattern, slowed pace Distance walked: clinic distances Assistive device utilized: None Level of assistance: Complete Independence   PATIENT SURVEYS:  FOTO 48 at intake (predicted score 58)   VESTIBULAR ASSESSMENT   GENERAL OBSERVATION: No acute distress, does report some anxiousness about this ongoing disequilibrium    SYMPTOM BEHAVIOR:   Subjective history: "Drank way too much on one evening, and then just didn't feel right several days later".  No c/o room spinning type dizziness.  Maybe very transiently.  Bending over makes it worse, when I come up   Non-Vestibular symptoms:  None   Type of dizziness: Imbalance (Disequilibrium) and Unsteady with head/body turns   Frequency: Several times per day   Duration: seconds   Aggravating factors: Induced by motion: bending down to the ground, turning body quickly, and turning head quickly   Relieving factors: slow movements and hold onto something   Progression of symptoms: better   OCULOMOTOR EXAM:   Ocular Alignment: normal   Ocular ROM: No Limitations   Spontaneous  Nystagmus: absent   Gaze-Induced Nystagmus: absent and (for horizontal); noted nystagmus upon return to center from downward vertical gaze   Smooth Pursuits: intact   Saccades: intact and for horizontal movements; nystagmus upon return to center from downward vertical gaze   Convergence/Divergence: reports seeing shadow; decreased convergence distance       VESTIBULAR - OCULAR REFLEX:    Slow VOR: Normal   VOR Cancellation: Normal   Head-Impulse Test: HIT Right: positive  Dynamic Visual Acuity: Static: Line 6 Dynamic: Line 5    POSITIONAL TESTING: Right Dix-Hallpike: no nystagmus and c/o minor dizziness, goes away quickly Left Dix-Hallpike: no nystagmus Right Roll Test: no nystagmus and no symptoms Left Roll Test: no nystagmus and reports feels different, but no reports of spinning  With L Dix-hallpike, pt reports double vision and appears that R eye has downward rotational beat  M-CTSIB  Condition 1: Firm Surface, EO 30 Sec, Normal Sway  Condition 2: Firm Surface, EC 15 Sec, Severe Sway  Condition 3: Foam Surface, EO 30 Sec, Mild Sway  Condition 4: Foam Surface, EC 30 Sec, Moderate and Severe Sway    MOTION SENSITIVITY: (Assessed 08/14/2022)    Motion Sensitivity Quotient  Intensity: 0 = none, 1 = Lightheaded, 2 = Mild, 3 = Moderate, 4 = Severe, 5 = Vomiting  Intensity  1. Sitting to supine 0  2. Supine to L side 2  3. Supine to R side 1  4. Supine to sitting 3  5. L Hallpike-Dix 0  6. Up from L  3  7. R Hallpike-Dix 0  8. Up from R  0  9. Sitting, head  tipped to L knee 0  10. Head up from L  knee 0  11. Sitting, head  tipped to R knee 0  12. Head up from R  knee 1  13. Sitting head turns x5 2  14.Sitting head nods x5 2  15. In stance, 180  turn to L  2  16. In stance, 180  turn to R 2       VESTIBULAR TREATMENT:  Gaze Adaptation:   x1 Viewing Horizontal: Position: seated, Time: 30 sec, and Comment: no c/o and x1 Viewing Vertical:  Position: seated,  Time: 30 sec, and Comment: no c/o   PATIENT EDUCATION: Education details: PT eval results, POC and initiation of HEP Person educated: Patient Education method: Explanation, Demonstration, Verbal cues, and Handouts Education comprehension: verbalized understanding, returned demonstration, and needs further education  Access Code: [email or web address] URL: [email or web address] Date: 08/10/2022 Prepared by: [facility]  Exercises - Seated Gaze Stabilization with Head Rotation  - 2 x daily - 7 x weekly - 2 sets - 30 sec hold - Seated Gaze Stabilization with Head Nod  - 2 x daily - 7 x weekly - 2 sets - 30 sec hold  GOALS: Goals reviewed with patient? Yes  SHORT TERM GOALS:= LTGs    LONG TERM GOALS: Target date: 09/11/2022   Pt will be independent with HEP for improved balance and decreased c/o dizziness/unsteadiness. Baseline:  Goal status: IN PROGRESS  2.  FOTO score to improve to 58 to demo improved overall functional mobility. Baseline: 48 Goal status: IN PROGRESS  3.  Improve conditions 2 and 4 on MCTSIB to 30 seconds with mild/moderate sway for improved vestibular system use for balance. Baseline: 15-30 sec mod-severe sway Goal status: IN PROGRESS  4.  DGI to be assessed and goal to be written as appropriate.  FGA score to improve to 20/30 for decreased fall risk. Baseline: 15/30 08/14/2022 Goal status: IN PROGRESS  ASSESSMENT:  CLINICAL IMPRESSION: Assessed FGA today, with score 15/30 indicating increased fall risk; increased unsteadiness noted with pt reporting "pull towards left" with head motions, narrowed BOS and turns.  Also, pt reports unsteadiness with coming up to sit from L sidelying position.  Addressed these with habituation and gaze stabilization progression exercises, with pt tolerating well, c/o symptoms 2-3/10.  She will continue to benefit from skilled PT towards goals for improved overall functional mobility and  decreased fall risk.  OBJECTIVE IMPAIRMENTS Abnormal gait, decreased balance, decreased mobility, difficulty walking, and dizziness.   ACTIVITY LIMITATIONS bending, standing, squatting, bed mobility, and locomotion level  PARTICIPATION LIMITATIONS: community activity and leisure activities  PERSONAL FACTORS  see [place] above  are also affecting patient's functional outcome.   REHAB POTENTIAL: Good  CLINICAL DECISION MAKING: Evolving/moderate complexity  EVALUATION COMPLEXITY: Moderate   PLAN: PT FREQUENCY: 2x/week  PT DURATION: other: 5 weeks, including eval week  PLANNED INTERVENTIONS: Therapeutic exercises, Therapeutic activity, Neuromuscular re-education, Balance training, Gait training, Patient/Family education, Self Care, Vestibular training, and [place] repositioning  PLAN FOR NEXT SESSION: Review progression of HEP for gaze stabilization and habituation; continue to work on turns.  Will need to work on compliant surfaces and ankle/hip strategy work   [employer] W., PT 08/14/2022, 11:58 AM   [place] at [facility], [place],  [address] Phone # [phone] Fax # [phone]

## 2022-08-17 ENCOUNTER — Ambulatory Visit: Payer: PPO | Admitting: Physical Therapy

## 2022-08-17 ENCOUNTER — Encounter: Payer: Self-pay | Admitting: Physical Therapy

## 2022-08-17 DIAGNOSIS — R2681 Unsteadiness on feet: Secondary | ICD-10-CM

## 2022-08-17 DIAGNOSIS — R42 Dizziness and giddiness: Secondary | ICD-10-CM | POA: Diagnosis not present

## 2022-08-17 NOTE — Therapy (Signed)
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 DOB:[birth year], 71 y.o., female Today's Date: 08/17/2022  PCP: No current local PCP-pt working to establish REFERRING PROVIDER: [name], DO   PT End of Session - 08/17/22 0759     Visit Number 3    Number of Visits 10    Date for PT Re-Evaluation 09/11/22    Authorization Type [employer]    PT Start Time 0800    PT Stop Time 0842    PT Time Calculation (min) 42 min    Activity Tolerance Patient tolerated treatment well    Behavior During Therapy [facility] for tasks assessed/performed               Past Medical History:  Diagnosis Date   Anxiety    Arthritis    Family history of breast cancer    Family history of melanoma    Family history of pancreatic cancer    History of basal cell carcinoma ([place]) excision 2014   left mid back   History of fungal infection 2018   systemic fungal infection due to black mold exposure   Hyperlipidemia    Hypothyroidism    followed by pcp   Irregular heartbeat    per pt asymptomatic ;   last cardiology visit w/ dr Caryl Comes 07-03-2020 for abnormal EKG, Atrial ectopy/ PACs w/ NS atrial tachycardia; work-up results in epic,  event monitor 04/ 2019 showed SR with PACs,  normal echo 04/ 2019, nuclear stress test showed normal no ischemia normal LVF nuclear ef 70%   Malignant neoplasm of upper-outer quadrant of left breast in female, estrogen receptor positive ([place]) 08/2017   oncologist--- dr [name]---  left breast DCIS , ER/PR+;   S/P left mastectomy w/ node dissestion and right prophalox masterectomy 09-06-2017,  no chemo/ radiation   Neck injury    05-16-2021  per pt had neck injury approx. 3 months ago,  had epi injeciton approx one month ago, followed by dr [name],  no rom restriction but do not hyperextend neck   Wears contact lenses    Past Surgical History:  Procedure Laterality Date   [place] Bilateral 09/06/2017   Procedure: LEFT MASTECTOMY WITH LEFT SENTINEL LYMPH NODE BIOPSY AND  RIGHT PROPHYLACTIC MASTECTOMY;  Surgeon: [name], MD;  Location: [place];  Service: General;  Laterality: Bilateral;   OOPHORECTOMY Bilateral    ROBOTIC ASSISTED SALPINGO OOPHERECTOMY Bilateral 05/20/2021   Procedure: XI ROBOTIC ASSISTED SALPINGO OOPHORECTOMY;  Surgeon: [name], MD;  Location: [place];  Service: Gynecology;  Laterality: Bilateral;   TONSILLECTOMY AND ADENOIDECTOMY     child   Patient Active Problem List   Diagnosis Date Noted   Changing skin lesion 11/11/2021   Basal cell carcinoma 08/12/2021   Atrial tachycardia (HCC) - non sustained 08/06/2021   PAC (premature atrial contraction) 08/06/2021   Preop cardiovascular exam 11/14/2019   Hyperlipidemia with target LDL less than 130 11/13/2019   Family history of breast cancer    Family history of melanoma    Family history of pancreatic cancer    Patellofemoral arthritis of right knee 02/28/2018   Palpitations 02/10/2018   Abnormal finding on EKG 02/10/2018   Breast cancer in situ 09/06/2017   Ductal carcinoma in situ (DCIS) of left breast 08/19/2017   Malignant neoplasm of upper-outer quadrant of left breast  in female, estrogen receptor positive ([place]) 08/17/2017   Low back pain 12/02/2016   Left hamstring injury 11/01/2013   Nonallopathic lesion of cervical region 11/01/2013   Nonallopathic lesion of lumbosacral region 11/01/2013   Nonallopathic lesion of thoracic region 11/01/2013    ONSET DATE: 07/20/2022 (MD referral)  REFERRING DIAG: H83.2X9 (ICD-10-CM) - Vestibular disequilibrium, unspecified laterality  THERAPY DIAG:  Unsteadiness on feet  Dizziness and giddiness  Rationale for Evaluation and Treatment Rehabilitation  SUBJECTIVE:   SUBJECTIVE STATEMENT: Having a good day.  Did dancing Friday night and didn't have any  trouble.  Feel like I'm slowly getting better.   Pt accompanied by: self  PERTINENT HISTORY:  hypothyroidism and left breast cancer (2018), arthritis, anxiety, systemic fungal infection due to black mold exposure   PAIN:  Are you having pain? No  PRECAUTIONS: Fall  WEIGHT BEARING RESTRICTIONS No  FALLS: Has patient fallen in last 6 months? Yes. Number of falls 1  LIVING ENVIRONMENT: Lives with: lives with their spouse Lives in: House/apartment Stairs: Yes: Internal: 13 steps; on right going up and External: 1 steps; none Has following equipment at home: None  PLOF: Independent and Leisure: Enjoys ballroom dancing, hiking, biking; works out regularly  [place] rid of sense of dysequilibrium  OBJECTIVE:    TODAY'S TREATMENT: 08/17/2022 Activity Comments  Reviewed HEP additions from last visit, with pt return demo understanding Rates symptoms as 3/10 with horizontal gaze stabilization, resolves in 5-10 sec  Standing Romberg stance, gaze stabilization to target horizontal and vertical, 30 sec each "Little unsteadiness with head motions"  Standing on Airex cushion:  feet apart/feet together, EO head turns/nods x 10,    Ankle/hip strategy on solid surface-ant/posterior, then on rockerboard; on rockerboard, performed lateral hip/ankle strategy with min guard/min assist for limits of stability Decreased hip strategy, needs tactile and verbal cues for technique  Wall bumps to encourage hip strategy in anterior/posterior direction, 10 reps VCs for technique  Head turn>body turns, quarter turns R and L x 5 reps (90 degrees), then progressed to 180 degrees     Access Code: [email or web address] URL: [email or web address] Date: 08/17/2022 Prepared by: [facility]  Program Notes Wall bumps:  stand about 1-2 ft from the wall and stick out your hips to bump the wall, then bring shoulders back to the wall.  Return to standing, bringing your  shoulders forward, then your hips.  Repeat 5-10 times.  Exercises - Seated Gaze Stabilization with Head Rotation  - 2 x daily - 7 x weekly - 2 sets - 30 sec hold - Brandt-Daroff Vestibular Exercise  - 1-2 x daily - 7 x weekly - 1 sets - 3 reps - Standing Gaze Stabilization with Head Rotation  - 2 x daily - 7 x weekly - 1-2 sets - 30 sec hold - Standing Gaze Stabilization with Head Nod  - 2 x daily - 7 x weekly - 1-2 sets - 30 sec hold - Standing Quarter Turn with Counter Support  - 1 x daily - 7 x weekly - 1 sets - 5 reps  PATIENT EDUCATION: Education details: HEP additions Person educated: Patient Education method: Explanation, Demonstration, and Handouts Education comprehension: verbalized understanding and returned demonstration  ------------------------------------------------------------------------ Objective measures below from evaluation: DIAGNOSTIC FINDINGS: CT head 05/18/2022: Mild age-related atrophy.  Calcified extra-axial mass along the LEFT lateral aspect of the frontal falx, 7 x 11 x 10 mm, favor calcified meningioma.  No acute intracranial abnormalities.  COGNITION: Overall cognitive status: Within functional limits for tasks assessed   POSTURE: No Significant postural limitations   Cervical ROM:   WFL for rotation and flexion/extension Active A/PROM (deg) eval  Flexion   Extension   Right lateral flexion   Left lateral flexion   Right rotation   Left rotation   (Blank rows = not tested)   BED MOBILITY:  Independent  TRANSFERS: Independent  GAIT: Gait pattern:  guarded gait pattern, slowed pace Distance walked: clinic distances Assistive device utilized: None Level of assistance: Complete Independence   PATIENT SURVEYS:  FOTO 48 at intake (predicted score 58)   VESTIBULAR ASSESSMENT   GENERAL OBSERVATION: No acute distress, does report some anxiousness about this ongoing disequilibrium    SYMPTOM BEHAVIOR:   Subjective history: "Drank way  too much on one evening, and then just didn't feel right several days later".  No c/o room spinning type dizziness.  Maybe very transiently.  Bending over makes it worse, when I come up   Non-Vestibular symptoms:  None   Type of dizziness: Imbalance (Disequilibrium) and Unsteady with head/body turns   Frequency: Several times per day   Duration: seconds   Aggravating factors: Induced by motion: bending down to the ground, turning body quickly, and turning head quickly   Relieving factors: slow movements and hold onto something   Progression of symptoms: better   OCULOMOTOR EXAM:   Ocular Alignment: normal   Ocular ROM: No Limitations   Spontaneous Nystagmus: absent   Gaze-Induced Nystagmus: absent and (for horizontal); noted nystagmus upon return to center from downward vertical gaze   Smooth Pursuits: intact   Saccades: intact and for horizontal movements; nystagmus upon return to center from downward vertical gaze   Convergence/Divergence: reports seeing shadow; decreased convergence distance       VESTIBULAR - OCULAR REFLEX:    Slow VOR: Normal   VOR Cancellation: Normal   Head-Impulse Test: HIT Right: positive   Dynamic Visual Acuity: Static: Line 6 Dynamic: Line 5    POSITIONAL TESTING: Right Dix-Hallpike: no nystagmus and c/o minor dizziness, goes away quickly Left Dix-Hallpike: no nystagmus Right Roll Test: no nystagmus and no symptoms Left Roll Test: no nystagmus and reports feels different, but no reports of spinning  With L Dix-hallpike, pt reports double vision and appears that R eye has downward rotational beat  M-CTSIB  Condition 1: Firm Surface, EO 30 Sec, Normal Sway  Condition 2: Firm Surface, EC 15 Sec, Severe Sway  Condition 3: Foam Surface, EO 30 Sec, Mild Sway  Condition 4: Foam Surface, EC 30 Sec, Moderate and Severe Sway    MOTION SENSITIVITY: (Assessed 08/14/2022)    Motion Sensitivity Quotient  Intensity: 0 = none, 1 = Lightheaded, 2 = Mild, 3 =  Moderate, 4 = Severe, 5 = Vomiting  Intensity  1. Sitting to supine 0  2. Supine to L side 2  3. Supine to R side 1  4. Supine to sitting 3  5. L Hallpike-Dix 0  6. Up from L  3  7. R Hallpike-Dix 0  8. Up from R  0  9. Sitting, head  tipped to L knee 0  10. Head up from L  knee 0  11. Sitting, head  tipped to R knee 0  12. Head up from R  knee 1  13. Sitting head turns x5 2  14.Sitting head nods x5 2  15. In stance, 180  turn to L  2  16. In stance, 180  turn to R 2       VESTIBULAR TREATMENT:  Gaze Adaptation:   x1 Viewing Horizontal: Position: seated, Time: 30 sec, and Comment: no c/o and x1 Viewing Vertical:  Position: seated, Time: 30 sec, and Comment: no c/o   PATIENT EDUCATION: Education details: PT eval results, POC and initiation of HEP Person educated: Patient Education method: Explanation, Demonstration, Verbal cues, and Handouts Education comprehension: verbalized understanding, returned demonstration, and needs further education  Access Code: [email or web address] URL: [email or web address] Date: 08/10/2022 Prepared by: [facility]  Exercises - Seated Gaze Stabilization with Head Rotation  - 2 x daily - 7 x weekly - 2 sets - 30 sec hold - Seated Gaze Stabilization with Head Nod  - 2 x daily - 7 x weekly - 2 sets - 30 sec hold  GOALS: Goals reviewed with patient? Yes  SHORT TERM GOALS:= LTGs    LONG TERM GOALS: Target date: 09/11/2022   Pt will be independent with HEP for improved balance and decreased c/o dizziness/unsteadiness. Baseline:  Goal status: IN PROGRESS  2.  FOTO score to improve to 58 to demo improved overall functional mobility. Baseline: 48 Goal status: IN PROGRESS  3.  Improve conditions 2 and 4 on MCTSIB to 30 seconds with mild/moderate sway for improved vestibular system use for balance. Baseline: 15-30 sec mod-severe sway Goal status: IN PROGRESS  4.  DGI to be assessed and goal  to be written as appropriate.  FGA score to improve to 20/30 for decreased fall risk. Baseline: 15/30 08/14/2022 Goal status: IN PROGRESS  ASSESSMENT:  CLINICAL IMPRESSION: Skilled PT session today focused on progression of standing gaze stabilization, turns, and compliant surfaces.  Pt presents today reporting feeling improvement in balance and unsteadiness symptoms.  She appears steadier with turns compared to last session.  With hip/ankle strategy work, pt demo decreased preference/use of hip strategy, needing extra cueing to adequately use hip strategy.  She will continue to benefit from skilled PT towards goals for improved balance and functional mobility.  OBJECTIVE IMPAIRMENTS Abnormal gait, decreased balance, decreased mobility, difficulty walking, and dizziness.   ACTIVITY LIMITATIONS bending, standing, squatting, bed mobility, and locomotion level  PARTICIPATION LIMITATIONS: community activity and leisure activities  PERSONAL FACTORS  see [place] above  are also affecting patient's functional outcome.   REHAB POTENTIAL: Good  CLINICAL DECISION MAKING: Evolving/moderate complexity  EVALUATION COMPLEXITY: Moderate   PLAN: PT FREQUENCY: 2x/week  PT DURATION: other: 5 weeks, including eval week  PLANNED INTERVENTIONS: Therapeutic exercises, Therapeutic activity, Neuromuscular re-education, Balance training, Gait training, Patient/Family education, Self Care, Vestibular training, and Canal[place] repositioning  PLAN FOR NEXT SESSION: Review progression of HEP for gaze stabilization and habituation; continue to work on turns.  Continue work on compliant surfaces and ankle/hip strategy work   [employer] W., PT 08/17/2022, 9:27 AM   [place] [facility] [address], [place], [place] [address] Phone # [phone] Fax # [phone]

## 2022-08-21 ENCOUNTER — Ambulatory Visit: Payer: PPO | Admitting: Physical Therapy

## 2022-08-21 ENCOUNTER — Encounter: Payer: Self-pay | Admitting: Physical Therapy

## 2022-08-21 DIAGNOSIS — R42 Dizziness and giddiness: Secondary | ICD-10-CM

## 2022-08-21 DIAGNOSIS — R2681 Unsteadiness on feet: Secondary | ICD-10-CM

## 2022-08-21 NOTE — Therapy (Signed)
OUTPATIENT PHYSICAL THERAPY VESTIBULAR TREATMENT NOTE     Patient Name: Whitney Eugenio, MD MRN: [id number] DOB:1951-02-27, 71 y.o., female Today's Date: 08/21/2022  PCP: No current local PCP-pt working to establish REFERRING PROVIDER: [name], DO   PT End of Session - 08/21/22 1020     Visit Number 4    Number of Visits 10    Date for PT Re-Evaluation 09/11/22    Authorization Type [employer]    PT Start Time 1022    PT Stop Time 1100    PT Time Calculation (min) 38 min    Equipment Utilized During Treatment Gait belt    Activity Tolerance Patient tolerated treatment well    Behavior During Therapy WFL for tasks assessed/performed                Past Medical History:  Diagnosis Date   Anxiety    Arthritis    Family history of breast cancer    Family history of melanoma    Family history of pancreatic cancer    History of basal cell carcinoma ([place]) excision 2014   left mid back   History of fungal infection 2018   systemic fungal infection due to black mold exposure   Hyperlipidemia    Hypothyroidism    followed by pcp   Irregular heartbeat    per pt asymptomatic ;   last cardiology visit w/ dr Caryl Comes 07-03-2020 for abnormal EKG, Atrial ectopy/ PACs w/ NS atrial tachycardia; work-up results in epic,  event monitor 04/ 2019 showed SR with PACs,  normal echo 04/ 2019, nuclear stress test showed normal no ischemia normal LVF nuclear ef 70%   Malignant neoplasm of upper-outer quadrant of left breast in female, estrogen receptor positive ([place]) 08/2017   oncologist--- dr [name]---  left breast DCIS , ER/PR+;   S/P left mastectomy w/ node dissestion and right prophalox masterectomy 09-06-2017,  no chemo/ radiation   Neck injury    05-16-2021  per pt had neck injury approx. 3 months ago,  had epi injeciton approx one month ago, followed by dr [name],  no rom restriction but do not hyperextend neck   Wears contact lenses    Past Surgical History:   Procedure Laterality Date   [place] Bilateral 09/06/2017   Procedure: LEFT MASTECTOMY WITH LEFT SENTINEL LYMPH NODE BIOPSY AND  RIGHT PROPHYLACTIC MASTECTOMY;  Surgeon: [name], MD;  Location: [facility];  Service: General;  Laterality: Bilateral;   OOPHORECTOMY Bilateral    ROBOTIC ASSISTED SALPINGO OOPHERECTOMY Bilateral 05/20/2021   Procedure: XI ROBOTIC ASSISTED SALPINGO OOPHORECTOMY;  Surgeon: [name], MD;  Location: [facility];  Service: Gynecology;  Laterality: Bilateral;   TONSILLECTOMY AND ADENOIDECTOMY     child   Patient Active Problem List   Diagnosis Date Noted   Changing skin lesion 11/11/2021   Basal cell carcinoma 08/12/2021   Atrial tachycardia ([place]) - non sustained 08/06/2021   PAC (premature atrial contraction) 08/06/2021   Preop cardiovascular exam 11/14/2019   Hyperlipidemia with target LDL less than 130 11/13/2019   Family history of breast cancer    Family history of melanoma    Family history of pancreatic cancer    Patellofemoral arthritis of right knee 02/28/2018   Palpitations 02/10/2018   Abnormal finding on EKG 02/10/2018   Breast cancer in situ 09/06/2017   Ductal carcinoma in situ (DCIS) of left breast 08/19/2017  Malignant neoplasm of upper-outer quadrant of left breast in female, estrogen receptor positive ([place]) 08/17/2017   Low back pain 12/02/2016   Left hamstring injury 11/01/2013   Nonallopathic lesion of cervical region 11/01/2013   Nonallopathic lesion of lumbosacral region 11/01/2013   Nonallopathic lesion of thoracic region 11/01/2013    ONSET DATE: 07/20/2022 (MD referral)  REFERRING DIAG: H83.2X9 (ICD-10-CM) - Vestibular disequilibrium, unspecified laterality  THERAPY DIAG:  Unsteadiness on feet  Dizziness and giddiness  Rationale for Evaluation and Treatment Rehabilitation  SUBJECTIVE:   SUBJECTIVE STATEMENT: Been very  good about doing my exercises.  No complaints.  Not getting as much dizziness with the Brandt-Daroff.  Pt reports she feels she is about 60% back to normal. Pt accompanied by: self  PERTINENT HISTORY:  hypothyroidism and left breast cancer (2018), arthritis, anxiety, systemic fungal infection due to black mold exposure   PAIN:  Are you having pain? No  PRECAUTIONS: Fall  WEIGHT BEARING RESTRICTIONS No  FALLS: Has patient fallen in last 6 months? Yes. Number of falls 1  LIVING ENVIRONMENT: Lives with: lives with their spouse Lives in: House/apartment Stairs: Yes: Internal: 13 steps; on right going up and External: 1 steps; none Has following equipment at home: None  PLOF: Independent and Leisure: Enjoys ballroom dancing, hiking, biking; works out regularly  [place] rid of sense of dysequilibrium  OBJECTIVE:    TODAY'S TREATMENT: 08/21/2022 Activity Comments  Worked on habituation of seated forward lean with combined head turn Mild c/o dizziness upon return to sit (1-2/10 to R side; 2-3/10 to L side)  3 reps seated forward lean with head turns to R/L upon going down For habituation, alternating sides, 15 seconds to resolve upon coming up from right; 30 sec to resolve upon coming up from L;improved time for symptoms to resolve, less intense reports of symptoms  Reviewed wall bumps and turning added to HEP last visit Return demo understanding  Standing on Airex cushion in corner:  feet apart/feet together, EO head turns/nods x 10, then EC head steady x 10 sec Increased sway with EC feet together  Standing on Airex in parallel bars:  marching in place x 10 reps, alt forward step/return to midline x 10, alt back step/return to midline x 10, alt side step/return to midline x 10; heel/toe raises x 10 reps Intermittent UE support  Gait 30 ft x 2 with head turns, then with head nods Mild sway   Access Code: [email or web address] URL: [email or web address] Date:  08/21/2022 Prepared by: [facility]  Program Notes Wall bumps:  stand about 1-2 ft from the wall and stick out your hips to bump the wall, then bring shoulders back to the wall.  Return to standing, bringing your shoulders forward, then your hips.  Repeat 5-10 times.Habituation exercise:  sitting with forward lean and look to the side, hold for 10 sec; then return to upright sitting until symptoms resolve plus 15-30 seconds.  Repeat 3 reps, 1-2 times per day.  Exercises - Seated Gaze Stabilization with Head Rotation  - 2 x daily - 7 x weekly - 2 sets - 30 sec hold - Brandt-Daroff Vestibular Exercise  - 1-2 x daily - 7 x weekly - 1 sets - 3 reps - Standing Gaze Stabilization with Head Rotation  - 2 x daily - 7 x weekly - 1-2 sets - 30 sec hold - Standing Gaze Stabilization with Head Nod  - 2 x daily - 7 x weekly -  1-2 sets - 30 sec hold - Standing Quarter Turn with Counter Support  - 1 x daily - 7 x weekly - 1 sets - 5 reps  PATIENT EDUCATION: Education details: HEP additions-habituation Person educated: Patient Education method: Explanation, Demonstration, and Handouts Education comprehension: verbalized understanding and returned demonstration   ------------------------------------------------------------------------ Objective measures below from evaluation: DIAGNOSTIC FINDINGS: CT head 05/18/2022: Mild age-related atrophy.  Calcified extra-axial mass along the LEFT lateral aspect of the frontal falx, 7 x 11 x 10 mm, favor calcified meningioma.  No acute intracranial abnormalities.   COGNITION: Overall cognitive status: Within functional limits for tasks assessed   POSTURE: No Significant postural limitations   Cervical ROM:   WFL for rotation and flexion/extension Active A/PROM (deg) eval  Flexion   Extension   Right lateral flexion   Left lateral flexion   Right rotation   Left rotation   (Blank rows = not tested)   BED MOBILITY:   Independent  TRANSFERS: Independent  GAIT: Gait pattern:  guarded gait pattern, slowed pace Distance walked: clinic distances Assistive device utilized: None Level of assistance: Complete Independence   PATIENT SURVEYS:  FOTO 48 at intake (predicted score 58)   VESTIBULAR ASSESSMENT   GENERAL OBSERVATION: No acute distress, does report some anxiousness about this ongoing disequilibrium    SYMPTOM BEHAVIOR:   Subjective history: "Drank way too much on one evening, and then just didn't feel right several days later".  No c/o room spinning type dizziness.  Maybe very transiently.  Bending over makes it worse, when I come up   Non-Vestibular symptoms:  None   Type of dizziness: Imbalance (Disequilibrium) and Unsteady with head/body turns   Frequency: Several times per day   Duration: seconds   Aggravating factors: Induced by motion: bending down to the ground, turning body quickly, and turning head quickly   Relieving factors: slow movements and hold onto something   Progression of symptoms: better   OCULOMOTOR EXAM:   Ocular Alignment: normal   Ocular ROM: No Limitations   Spontaneous Nystagmus: absent   Gaze-Induced Nystagmus: absent and (for horizontal); noted nystagmus upon return to center from downward vertical gaze   Smooth Pursuits: intact   Saccades: intact and for horizontal movements; nystagmus upon return to center from downward vertical gaze   Convergence/Divergence: reports seeing shadow; decreased convergence distance       VESTIBULAR - OCULAR REFLEX:    Slow VOR: Normal   VOR Cancellation: Normal   Head-Impulse Test: HIT Right: positive   Dynamic Visual Acuity: Static: Line 6 Dynamic: Line 5    POSITIONAL TESTING: Right Dix-Hallpike: no nystagmus and c/o minor dizziness, goes away quickly Left Dix-Hallpike: no nystagmus Right Roll Test: no nystagmus and no symptoms Left Roll Test: no nystagmus and reports feels different, but no reports of  spinning  With L Dix-hallpike, pt reports double vision and appears that R eye has downward rotational beat  M-CTSIB  Condition 1: Firm Surface, EO 30 Sec, Normal Sway  Condition 2: Firm Surface, EC 15 Sec, Severe Sway  Condition 3: Foam Surface, EO 30 Sec, Mild Sway  Condition 4: Foam Surface, EC 30 Sec, Moderate and Severe Sway    MOTION SENSITIVITY: (Assessed 08/14/2022)    Motion Sensitivity Quotient  Intensity: 0 = none, 1 = Lightheaded, 2 = Mild, 3 = Moderate, 4 = Severe, 5 = Vomiting  Intensity  1. Sitting to supine 0  2. Supine to L side 2  3. Supine to R side 1  4. Supine to sitting 3  5. L Hallpike-Dix 0  6. Up from L  3  7. R Hallpike-Dix 0  8. Up from R  0  9. Sitting, head  tipped to L knee 0  10. Head up from L  knee 0  11. Sitting, head  tipped to R knee 0  12. Head up from R  knee 1  13. Sitting head turns x5 2  14.Sitting head nods x5 2  15. In stance, 180  turn to L  2  16. In stance, 180  turn to R 2       VESTIBULAR TREATMENT:  Gaze Adaptation:   x1 Viewing Horizontal: Position: seated, Time: 30 sec, and Comment: no c/o and x1 Viewing Vertical:  Position: seated, Time: 30 sec, and Comment: no c/o   PATIENT EDUCATION: Education details: PT eval results, POC and initiation of HEP Person educated: Patient Education method: Explanation, Demonstration, Verbal cues, and Handouts Education comprehension: verbalized understanding, returned demonstration, and needs further education  Access Code: [email or web address] URL: [email or web address] Date: 08/10/2022 Prepared by: [facility]  Exercises - Seated Gaze Stabilization with Head Rotation  - 2 x daily - 7 x weekly - 2 sets - 30 sec hold - Seated Gaze Stabilization with Head Nod  - 2 x daily - 7 x weekly - 2 sets - 30 sec hold  GOALS: Goals reviewed with patient? Yes  SHORT TERM GOALS:= LTGs    LONG TERM GOALS: Target date: 09/11/2022   Pt will  be independent with HEP for improved balance and decreased c/o dizziness/unsteadiness. Baseline:  Goal status: IN PROGRESS  2.  FOTO score to improve to 58 to demo improved overall functional mobility. Baseline: 48 Goal status: IN PROGRESS  3.  Improve conditions 2 and 4 on MCTSIB to 30 seconds with mild/moderate sway for improved vestibular system use for balance. Baseline: 15-30 sec mod-severe sway Goal status: IN PROGRESS  4.  DGI to be assessed and goal to be written as appropriate.  FGA score to improve to 20/30 for decreased fall risk. Baseline: 15/30 08/14/2022 Goal status: IN PROGRESS  ASSESSMENT:  CLINICAL IMPRESSION: Continued progress of balance exercises on compliant surfaces and addressing vestibular system use for balance and balance strategies.  With short distance gait activities with head turns, pt demo decreased sway and lateral excursion compared to when FGA performed last week.  Based on pt's report of seated forward bend with head turns bringing on some dizziness, worked in this position for habituation exercise (added to HEP), with pt noting less intensity and duration of symptoms with repeated movements in session today.  She is making good progress towards goals and will continue to benefit from skilled PT towards goals for improved balance and functional mobility.  OBJECTIVE IMPAIRMENTS Abnormal gait, decreased balance, decreased mobility, difficulty walking, and dizziness.   ACTIVITY LIMITATIONS bending, standing, squatting, bed mobility, and locomotion level  PARTICIPATION LIMITATIONS: community activity and leisure activities  PERSONAL FACTORS  see [place] above  are also affecting patient's functional outcome.   REHAB POTENTIAL: Good  CLINICAL DECISION MAKING: Evolving/moderate complexity  EVALUATION COMPLEXITY: Moderate   PLAN: PT FREQUENCY: 2x/week  PT DURATION: other: 5 weeks, including eval week  PLANNED INTERVENTIONS: Therapeutic exercises,  Therapeutic activity, Neuromuscular re-education, Balance training, Gait training, Patient/Family education, Self Care, Vestibular training, and [place] repositioning  PLAN FOR NEXT SESSION: Continue towards LTGs.  Review progression of HEP for habituation; continue to work on  head turns/nods with gait.  Continue work on compliant surfaces and ankle/hip strategy work   [employer] W., PT 08/21/2022, 11:06 AM   [facility] [place], [address] [place], [place] [address] Phone # [phone] Fax # [phone]

## 2022-08-24 ENCOUNTER — Ambulatory Visit: Payer: PPO | Admitting: Physical Therapy

## 2022-08-24 ENCOUNTER — Encounter: Payer: Self-pay | Admitting: Physical Therapy

## 2022-08-24 DIAGNOSIS — R42 Dizziness and giddiness: Secondary | ICD-10-CM

## 2022-08-24 DIAGNOSIS — R2681 Unsteadiness on feet: Secondary | ICD-10-CM

## 2022-08-24 NOTE — Therapy (Signed)
OUTPATIENT PHYSICAL THERAPY VESTIBULAR TREATMENT NOTE     Patient Name: Whitney Maurin, MD MRN: [id number] DOB:1951-03-05, 71 y.o., female Today's Date: 08/24/2022  PCP: No current local PCP-pt working to establish REFERRING PROVIDER: [name], DO   PT End of Session - 08/24/22 1023     Visit Number 5    Number of Visits 10    Date for PT Re-Evaluation 09/11/22    Authorization Type [employer]    PT Start Time 1020    PT Stop Time 1058    PT Time Calculation (min) 38 min    Equipment Utilized During Treatment --    Activity Tolerance Patient tolerated treatment well    Behavior During Therapy WFL for tasks assessed/performed                 Past Medical History:  Diagnosis Date   Anxiety    Arthritis    Family history of breast cancer    Family history of melanoma    Family history of pancreatic cancer    History of basal cell carcinoma ([place]) excision 2014   left mid back   History of fungal infection 2018   systemic fungal infection due to black mold exposure   Hyperlipidemia    Hypothyroidism    followed by pcp   Irregular heartbeat    per pt asymptomatic ;   last cardiology visit w/ dr Caryl Comes 07-03-2020 for abnormal EKG, Atrial ectopy/ PACs w/ NS atrial tachycardia; work-up results in epic,  event monitor 04/ 2019 showed SR with PACs,  normal echo 04/ 2019, nuclear stress test showed normal no ischemia normal LVF nuclear ef 70%   Malignant neoplasm of upper-outer quadrant of left breast in female, estrogen receptor positive ([place]) 08/2017   oncologist--- dr [name]---  left breast DCIS , ER/PR+;   S/P left mastectomy w/ node dissestion and right prophalox masterectomy 09-06-2017,  no chemo/ radiation   Neck injury    05-16-2021  per pt had neck injury approx. 3 months ago,  had epi injeciton approx one month ago, followed by dr [name],  no rom restriction but do not hyperextend neck   Wears contact lenses    Past Surgical History:   Procedure Laterality Date   [place] Bilateral 09/06/2017   Procedure: LEFT MASTECTOMY WITH LEFT SENTINEL LYMPH NODE BIOPSY AND  RIGHT PROPHYLACTIC MASTECTOMY;  Surgeon: [name], MD;  Location: [facility];  Service: General;  Laterality: Bilateral;   OOPHORECTOMY Bilateral    ROBOTIC ASSISTED SALPINGO OOPHERECTOMY Bilateral 05/20/2021   Procedure: XI ROBOTIC ASSISTED SALPINGO OOPHORECTOMY;  Surgeon: [name], MD;  Location: [facility];  Service: Gynecology;  Laterality: Bilateral;   TONSILLECTOMY AND ADENOIDECTOMY     child   Patient Active Problem List   Diagnosis Date Noted   Changing skin lesion 11/11/2021   Basal cell carcinoma 08/12/2021   Atrial tachycardia ([place]) - non sustained 08/06/2021   PAC (premature atrial contraction) 08/06/2021   Preop cardiovascular exam 11/14/2019   Hyperlipidemia with target LDL less than 130 11/13/2019   Family history of breast cancer    Family history of melanoma    Family history of pancreatic cancer    Patellofemoral arthritis of right knee 02/28/2018   Palpitations 02/10/2018   Abnormal finding on EKG 02/10/2018   Breast cancer in situ 09/06/2017   Ductal carcinoma in situ (DCIS) of left breast 08/19/2017  Malignant neoplasm of upper-outer quadrant of left breast in female, estrogen receptor positive ([place]) 08/17/2017   Low back pain 12/02/2016   Left hamstring injury 11/01/2013   Nonallopathic lesion of cervical region 11/01/2013   Nonallopathic lesion of lumbosacral region 11/01/2013   Nonallopathic lesion of thoracic region 11/01/2013    ONSET DATE: 07/20/2022 (MD referral)  REFERRING DIAG: H83.2X9 (ICD-10-CM) - Vestibular disequilibrium, unspecified laterality  THERAPY DIAG:  Unsteadiness on feet  Dizziness and giddiness  Rationale for Evaluation and Treatment Rehabilitation  SUBJECTIVE:   SUBJECTIVE STATEMENT: Doing well.   The exercises are bringing on just a little dizziness, just for a few seconds.  Overall, feel doing better.  Would like to consider going down to 1x/wk.  Pt accompanied by: self  PERTINENT HISTORY:  hypothyroidism and left breast cancer (2018), arthritis, anxiety, systemic fungal infection due to black mold exposure   PAIN:  Are you having pain? No  PRECAUTIONS: Fall  WEIGHT BEARING RESTRICTIONS No  FALLS: Has patient fallen in last 6 months? Yes. Number of falls 1  LIVING ENVIRONMENT: Lives with: lives with their spouse Lives in: House/apartment Stairs: Yes: Internal: 13 steps; on right going up and External: 1 steps; none Has following equipment at home: None  PLOF: Independent and Leisure: Enjoys ballroom dancing, hiking, biking; works out regularly  [place] rid of sense of dysequilibrium  OBJECTIVE:    TODAY'S TREATMENT: 08/24/2022 Activity Comments  Standing on Airex cushion in corner:  feet apart/feet together, EO head turns/nods x 10, then EC head steady x 15 sec Mild sway with head turns, with EC  Standing on Airex in parallel bars:  marching in place x 10 reps, alt forward step/return to midline x 10, alt back step/return to midline x 10, alt side step/return to midline x 10; heel/toe raises x 10 reps Able to perform without UE support  Gait with head turns, head nods 30 ft, multiple reps; with head turns, more unsteadiness.   Able to improve steadiness with head turns with focus on target.  Gait with wider BOS 30 ft x 4 reps   Gait with EC, 2 reps <10 sec, veers to R side       M-CTSIB  Condition 1: Firm Surface, EO 30 Sec, Normal Sway  Condition 2: Firm Surface, EC 30 Sec, Normal Sway  Condition 3: Foam Surface, EO 30 Sec, Normal Sway  Condition 4: Foam Surface, EC 30 Sec, Mild Sway   Access Code: [email or web address] URL: [email or web address] Date: 08/24/2022-most recent update Prepared by: [facility]  Program Notes Wall bumps:  stand about 1-2 ft from the wall and stick out your hips to bump the wall, then bring shoulders back to the wall.  Return to standing, bringing your shoulders forward, then your hips.  Repeat 5-10 times.Habituation exercise:  sitting with forward lean and look to the side, hold for 10 sec; then return to upright sitting until symptoms resolve plus 15-30 seconds.  Repeat 3 reps, 1-2 times per day.  Exercises - Seated Gaze Stabilization with Head Rotation  - 2 x daily - 7 x weekly - 2 sets - 30 sec hold - Brandt-Daroff Vestibular Exercise  - 1-2 x daily - 7 x weekly - 1 sets - 3 reps - Standing Gaze Stabilization with Head Rotation  - 2 x daily - 7 x weekly - 1-2 sets - 30 sec hold - Standing Gaze Stabilization with Head Nod  - 2 x daily -  7 x weekly - 1-2 sets - 30 sec hold - Standing Quarter Turn with Counter Support  - 1 x daily - 7 x weekly - 1 sets - 5 reps - Standing on Foam Pad  - 1 x daily - 5 x weekly - 1 sets - 10 reps - Walking with Head Rotation  - 1-2 x daily - 5 x weekly - 1 sets - 3-5 reps  PATIENT EDUCATION: Education details: HEP additions, going down to 1x/wk Person educated: Patient Education method: Explanation, Demonstration, and Handouts Education comprehension: verbalized understanding and returned demonstration   ------------------------------------------------------------------------ Objective measures below from evaluation: DIAGNOSTIC FINDINGS: CT head 05/18/2022: Mild age-related atrophy.  Calcified extra-axial mass along the LEFT lateral aspect of the frontal falx, 7 x 11 x 10 mm, favor calcified meningioma.  No acute intracranial abnormalities.   COGNITION: Overall cognitive status: Within functional limits for tasks assessed   POSTURE: No Significant postural limitations   Cervical ROM:   WFL for rotation and flexion/extension Active A/PROM (deg) eval  Flexion   Extension   Right lateral flexion   Left lateral  flexion   Right rotation   Left rotation   (Blank rows = not tested)   BED MOBILITY:  Independent  TRANSFERS: Independent  GAIT: Gait pattern:  guarded gait pattern, slowed pace Distance walked: clinic distances Assistive device utilized: None Level of assistance: Complete Independence   PATIENT SURVEYS:  FOTO 48 at intake (predicted score 58)   VESTIBULAR ASSESSMENT   GENERAL OBSERVATION: No acute distress, does report some anxiousness about this ongoing disequilibrium    SYMPTOM BEHAVIOR:   Subjective history: "Drank way too much on one evening, and then just didn't feel right several days later".  No c/o room spinning type dizziness.  Maybe very transiently.  Bending over makes it worse, when I come up   Non-Vestibular symptoms:  None   Type of dizziness: Imbalance (Disequilibrium) and Unsteady with head/body turns   Frequency: Several times per day   Duration: seconds   Aggravating factors: Induced by motion: bending down to the ground, turning body quickly, and turning head quickly   Relieving factors: slow movements and hold onto something   Progression of symptoms: better   OCULOMOTOR EXAM:   Ocular Alignment: normal   Ocular ROM: No Limitations   Spontaneous Nystagmus: absent   Gaze-Induced Nystagmus: absent and (for horizontal); noted nystagmus upon return to center from downward vertical gaze   Smooth Pursuits: intact   Saccades: intact and for horizontal movements; nystagmus upon return to center from downward vertical gaze   Convergence/Divergence: reports seeing shadow; decreased convergence distance       VESTIBULAR - OCULAR REFLEX:    Slow VOR: Normal   VOR Cancellation: Normal   Head-Impulse Test: HIT Right: positive   Dynamic Visual Acuity: Static: Line 6 Dynamic: Line 5    POSITIONAL TESTING: Right Dix-Hallpike: no nystagmus and c/o minor dizziness, goes away quickly Left Dix-Hallpike: no nystagmus Right Roll Test: no nystagmus and no  symptoms Left Roll Test: no nystagmus and reports feels different, but no reports of spinning  With L Dix-hallpike, pt reports double vision and appears that R eye has downward rotational beat  M-CTSIB  Condition 1: Firm Surface, EO 30 Sec, Normal Sway  Condition 2: Firm Surface, EC 15 Sec, Severe Sway  Condition 3: Foam Surface, EO 30 Sec, Mild Sway  Condition 4: Foam Surface, EC 30 Sec, Moderate and Severe Sway    MOTION SENSITIVITY: (Assessed 08/14/2022)  Motion Sensitivity Quotient  Intensity: 0 = none, 1 = Lightheaded, 2 = Mild, 3 = Moderate, 4 = Severe, 5 = Vomiting  Intensity  1. Sitting to supine 0  2. Supine to L side 2  3. Supine to R side 1  4. Supine to sitting 3  5. L Hallpike-Dix 0  6. Up from L  3  7. R Hallpike-Dix 0  8. Up from R  0  9. Sitting, head  tipped to L knee 0  10. Head up from L  knee 0  11. Sitting, head  tipped to R knee 0  12. Head up from R  knee 1  13. Sitting head turns x5 2  14.Sitting head nods x5 2  15. In stance, 180  turn to L  2  16. In stance, 180  turn to R 2       VESTIBULAR TREATMENT:  Gaze Adaptation:   x1 Viewing Horizontal: Position: seated, Time: 30 sec, and Comment: no c/o and x1 Viewing Vertical:  Position: seated, Time: 30 sec, and Comment: no c/o   PATIENT EDUCATION: Education details: PT eval results, POC and initiation of HEP Person educated: Patient Education method: Explanation, Demonstration, Verbal cues, and Handouts Education comprehension: verbalized understanding, returned demonstration, and needs further education  Access Code: [email or web address] URL: [email or web address] Date: 08/10/2022 Prepared by: [facility]  Exercises - Seated Gaze Stabilization with Head Rotation  - 2 x daily - 7 x weekly - 2 sets - 30 sec hold - Seated Gaze Stabilization with Head Nod  - 2 x daily - 7 x weekly - 2 sets - 30 sec hold  GOALS: Goals reviewed with patient?  Yes  SHORT TERM GOALS:= LTGs    LONG TERM GOALS: Target date: 09/11/2022   Pt will be independent with HEP for improved balance and decreased c/o dizziness/unsteadiness. Baseline:  Goal status: IN PROGRESS  2.  FOTO score to improve to 58 to demo improved overall functional mobility. Baseline: 48 Goal status: IN PROGRESS  3.  Improve conditions 2 and 4 on MCTSIB to 30 seconds with mild/moderate sway for improved vestibular system use for balance. Baseline: 15-30 sec mod-severe sway Goal status: IN PROGRESS  4.  DGI to be assessed and goal to be written as appropriate.  FGA score to improve to 20/30 for decreased fall risk. Baseline: 15/30 08/14/2022 Goal status: IN PROGRESS  ASSESSMENT:  CLINICAL IMPRESSION: Pt continues to report improvement dizziness and unsteadiness.  She reports that exercises for habituation (Brandt-Daroff and seated trunk flexion with head turns) continue to bring on dizziness, but very slight and only several seconds, which is improved from last visit.  With horizontal head turns standing on compliant surface and with gait, pt has increased instability, but again, improved from initial visits.  Overall, she reports good improvements and would like to go down in frequency to 1x/wk.  She is demonstrating and reporting good compliance iwht HEP.  She continues to be making good progress towards goals and will continue to benefit from skilled PT towards goals for improved balance and functional mobility.  OBJECTIVE IMPAIRMENTS Abnormal gait, decreased balance, decreased mobility, difficulty walking, and dizziness.   ACTIVITY LIMITATIONS bending, standing, squatting, bed mobility, and locomotion level  PARTICIPATION LIMITATIONS: community activity and leisure activities  PERSONAL FACTORS  see [place] above  are also affecting patient's functional outcome.   REHAB POTENTIAL: Good  CLINICAL DECISION MAKING: Evolving/moderate complexity  EVALUATION COMPLEXITY:  Moderate  PLAN: PT FREQUENCY: 2x/week  PT DURATION: other: 5 weeks, including eval week  PLANNED INTERVENTIONS: Therapeutic exercises, Therapeutic activity, Neuromuscular re-education, Balance training, Gait training, Patient/Family education, Self Care, Vestibular training, and [place] repositioning  PLAN FOR NEXT SESSION: Decreasing frequency to 1x/wk based on pt's progression.  Continue towards LTGs.  Review progression of HEP; continue to work on head turns/nods with gait.  Continue work on compliant surfaces and ankle/hip strategy work.  May want to look at goals next week (last scheduled week) and consider d/c if pt feeling well.   Frazier Butt., PT 08/24/2022, 10:58 AM   [facility] [address], [place], [place] [address] Phone # [phone] Fax # [phone]

## 2022-08-28 ENCOUNTER — Ambulatory Visit: Payer: PPO | Admitting: Physical Therapy

## 2022-08-31 ENCOUNTER — Encounter: Payer: Self-pay | Admitting: Physical Therapy

## 2022-08-31 ENCOUNTER — Ambulatory Visit: Payer: PPO | Admitting: Physical Therapy

## 2022-08-31 DIAGNOSIS — R42 Dizziness and giddiness: Secondary | ICD-10-CM | POA: Diagnosis not present

## 2022-08-31 DIAGNOSIS — R2681 Unsteadiness on feet: Secondary | ICD-10-CM

## 2022-08-31 NOTE — Therapy (Signed)
OUTPATIENT PHYSICAL THERAPY VESTIBULAR TREATMENT NOTE     Patient Name: Whitney Makela, MD MRN: [id number] DOB:Mar 21, 1951, 71 y.o., female Today's Date: 08/31/2022  PCP: No current local PCP-pt working to establish REFERRING PROVIDER: [name], DO   PT End of Session - 08/31/22 1018     Visit Number 6    Number of Visits 10    Date for PT Re-Evaluation 09/11/22    Authorization Type [employer]    PT Start Time 1022    PT Stop Time 1100    PT Time Calculation (min) 38 min    Activity Tolerance Patient tolerated treatment well    Behavior During Therapy WFL for tasks assessed/performed                  Past Medical History:  Diagnosis Date   Anxiety    Arthritis    Family history of breast cancer    Family history of melanoma    Family history of pancreatic cancer    History of basal cell carcinoma ([place]) excision 2014   left mid back   History of fungal infection 2018   systemic fungal infection due to black mold exposure   Hyperlipidemia    Hypothyroidism    followed by pcp   Irregular heartbeat    per pt asymptomatic ;   last cardiology visit w/ dr Caryl Comes 07-03-2020 for abnormal EKG, Atrial ectopy/ PACs w/ NS atrial tachycardia; work-up results in epic,  event monitor 04/ 2019 showed SR with PACs,  normal echo 04/ 2019, nuclear stress test showed normal no ischemia normal LVF nuclear ef 70%   Malignant neoplasm of upper-outer quadrant of left breast in female, estrogen receptor positive ([place]) 08/2017   oncologist--- dr [name]---  left breast DCIS , ER/PR+;   S/P left mastectomy w/ node dissestion and right prophalox masterectomy 09-06-2017,  no chemo/ radiation   Neck injury    05-16-2021  per pt had neck injury approx. 3 months ago,  had epi injeciton approx one month ago, followed by dr [name],  no rom restriction but do not hyperextend neck   Wears contact lenses    Past Surgical History:  Procedure Laterality Date   [place] Bilateral 09/06/2017   Procedure: LEFT MASTECTOMY WITH LEFT SENTINEL LYMPH NODE BIOPSY AND  RIGHT PROPHYLACTIC MASTECTOMY;  Surgeon: [name], MD;  Location: [facility];  Service: General;  Laterality: Bilateral;   OOPHORECTOMY Bilateral    ROBOTIC ASSISTED SALPINGO OOPHERECTOMY Bilateral 05/20/2021   Procedure: XI ROBOTIC ASSISTED SALPINGO OOPHORECTOMY;  Surgeon: [name], MD;  Location: [facility];  Service: Gynecology;  Laterality: Bilateral;   TONSILLECTOMY AND ADENOIDECTOMY     child   Patient Active Problem List   Diagnosis Date Noted   Changing skin lesion 11/11/2021   Basal cell carcinoma 08/12/2021   Atrial tachycardia (HCC) - non sustained 08/06/2021   PAC (premature atrial contraction) 08/06/2021   Preop cardiovascular exam 11/14/2019   Hyperlipidemia with target LDL less than 130 11/13/2019   Family history of breast cancer    Family history of melanoma    Family history of pancreatic cancer    Patellofemoral arthritis of right knee 02/28/2018   Palpitations 02/10/2018   Abnormal finding on EKG 02/10/2018   Breast cancer in situ 09/06/2017   Ductal carcinoma in situ (DCIS) of left breast 08/19/2017   Malignant neoplasm of upper-outer quadrant  of left breast in female, estrogen receptor positive ([place]) 08/17/2017   Low back pain 12/02/2016   Left hamstring injury 11/01/2013   Nonallopathic lesion of cervical region 11/01/2013   Nonallopathic lesion of lumbosacral region 11/01/2013   Nonallopathic lesion of thoracic region 11/01/2013    ONSET DATE: 07/20/2022 (MD referral)  REFERRING DIAG: H83.2X9 (ICD-10-CM) - Vestibular disequilibrium, unspecified laterality  THERAPY DIAG:  Dizziness and giddiness  Unsteadiness on feet  Rationale for Evaluation and Treatment Rehabilitation  SUBJECTIVE:   SUBJECTIVE STATEMENT: Doing okay.  Dizziness continues to get better.  Did have some  turns while I was dancing on Wednesday and had some dizziness-it settled and then went back without difficulty.  Pt accompanied by: self  PERTINENT HISTORY:  hypothyroidism and left breast cancer (2018), arthritis, anxiety, systemic fungal infection due to black mold exposure   PAIN:  Are you having pain? No  PRECAUTIONS: Fall  WEIGHT BEARING RESTRICTIONS No  FALLS: Has patient fallen in last 6 months? Yes. Number of falls 1  LIVING ENVIRONMENT: Lives with: lives with their spouse Lives in: House/apartment Stairs: Yes: Internal: 13 steps; on right going up and External: 1 steps; none Has following equipment at home: None  PLOF: Independent and Leisure: Enjoys ballroom dancing, hiking, biking; works out regularly  [place] rid of sense of dysequilibrium  OBJECTIVE:    TODAY'S TREATMENT: 08/31/2022 Activity Comments  Reviewed HEP additions from last visit: Feet apart/feet together EO head turns/nods, EC head steady Pt return demo understanding  EC on foam with head turns/nods feet apart and together Mild unsteadiness, able to maintain balance throughout  Gait with head turns, nods x 3 reps; gait with quick 180 turn and stop Mild unsteadiness with turn and stop-pt with narrow BOS with turn and stop  Gait with quick stop/starts, forward/back    Figure-8 turns x 2 reps No c/o dizziness  MMT R and L ankle inversion 4+/5, eversion 3+/5; Seated ankle eversion resisted with red/green theraband x 5-8 reps each, cues to avoid hip external rotation compensation In response to pt asking about L foot turning in at times.  Resisted sidestepping 4 x 8 ft, then resisted monster walk forward 4 x 8 ft with green band Pt notes fatigue in gluts   Access Code: [email or web address] URL: [email or web address] Date: 08/31/2022-additions Prepared by: [facility]  Program Notes Wall bumps:  stand about 1-2 ft from the wall and stick out your hips to  bump the wall, then bring shoulders back to the wall.  Return to standing, bringing your shoulders forward, then your hips.  Repeat 5-10 times.Habituation exercise:  sitting with forward lean and look to the side, hold for 10 sec; then return to upright sitting until symptoms resolve plus 15-30 seconds.  Repeat 3 reps, 1-2 times per day.08/31/22:  Walk and then turn and stop.  Make sure to keep your feet wide when you turn and stop.  Repeat 3-5 times  Exercises - Seated Gaze Stabilization with Head Rotation  - 2 x daily - 7 x weekly - 2 sets - 30 sec hold - Brandt-Daroff Vestibular Exercise  - 1-2 x daily - 7 x weekly - 1 sets - 3 reps - Standing Gaze Stabilization with Head Rotation  - 2 x daily - 7 x weekly - 1-2 sets - 30 sec hold - Standing Gaze Stabilization with Head Nod  - 2 x daily - 7 x weekly - 1-2 sets - 30 sec hold -  Standing Quarter Turn with Counter Support  - 1 x daily - 7 x weekly - 1 sets - 5 reps - Standing on Foam Pad  - 1 x daily - 5 x weekly - 1 sets - 10 reps - Walking with Head Rotation  - 1-2 x daily - 5 x weekly - 1 sets - 3-5 reps - Seated Ankle Eversion with Resistance  - 1 x daily - 5 x weekly - 3 sets - 10 reps  Education details: HEP additions Education method: Explanation, Demonstration, and Handouts Education comprehension: verbalized understanding and returned demonstration   ------------------------------------------------------------------------ Objective measures below from evaluation: DIAGNOSTIC FINDINGS: CT head 05/18/2022: Mild age-related atrophy.  Calcified extra-axial mass along the LEFT lateral aspect of the frontal falx, 7 x 11 x 10 mm, favor calcified meningioma.  No acute intracranial abnormalities.   COGNITION: Overall cognitive status: Within functional limits for tasks assessed   POSTURE: No Significant postural limitations   Cervical ROM:   WFL for rotation and flexion/extension Active A/PROM (deg) eval  Flexion   Extension   Right  lateral flexion   Left lateral flexion   Right rotation   Left rotation   (Blank rows = not tested)   BED MOBILITY:  Independent  TRANSFERS: Independent  GAIT: Gait pattern:  guarded gait pattern, slowed pace Distance walked: clinic distances Assistive device utilized: None Level of assistance: Complete Independence   PATIENT SURVEYS:  FOTO 48 at intake (predicted score 58)   VESTIBULAR ASSESSMENT   GENERAL OBSERVATION: No acute distress, does report some anxiousness about this ongoing disequilibrium    SYMPTOM BEHAVIOR:   Subjective history: "Drank way too much on one evening, and then just didn't feel right several days later".  No c/o room spinning type dizziness.  Maybe very transiently.  Bending over makes it worse, when I come up   Non-Vestibular symptoms:  None   Type of dizziness: Imbalance (Disequilibrium) and Unsteady with head/body turns   Frequency: Several times per day   Duration: seconds   Aggravating factors: Induced by motion: bending down to the ground, turning body quickly, and turning head quickly   Relieving factors: slow movements and hold onto something   Progression of symptoms: better   OCULOMOTOR EXAM:   Ocular Alignment: normal   Ocular ROM: No Limitations   Spontaneous Nystagmus: absent   Gaze-Induced Nystagmus: absent and (for horizontal); noted nystagmus upon return to center from downward vertical gaze   Smooth Pursuits: intact   Saccades: intact and for horizontal movements; nystagmus upon return to center from downward vertical gaze   Convergence/Divergence: reports seeing shadow; decreased convergence distance       VESTIBULAR - OCULAR REFLEX:    Slow VOR: Normal   VOR Cancellation: Normal   Head-Impulse Test: HIT Right: positive   Dynamic Visual Acuity: Static: Line 6 Dynamic: Line 5    POSITIONAL TESTING: Right Dix-Hallpike: no nystagmus and c/o minor dizziness, goes away quickly Left Dix-Hallpike: no nystagmus Right Roll  Test: no nystagmus and no symptoms Left Roll Test: no nystagmus and reports feels different, but no reports of spinning  With L Dix-hallpike, pt reports double vision and appears that R eye has downward rotational beat  M-CTSIB  Condition 1: Firm Surface, EO 30 Sec, Normal Sway  Condition 2: Firm Surface, EC 15 Sec, Severe Sway  Condition 3: Foam Surface, EO 30 Sec, Mild Sway  Condition 4: Foam Surface, EC 30 Sec, Moderate and Severe Sway    MOTION SENSITIVITY: (Assessed 08/14/2022)  Motion Sensitivity Quotient  Intensity: 0 = none, 1 = Lightheaded, 2 = Mild, 3 = Moderate, 4 = Severe, 5 = Vomiting  Intensity  1. Sitting to supine 0  2. Supine to L side 2  3. Supine to R side 1  4. Supine to sitting 3  5. L Hallpike-Dix 0  6. Up from L  3  7. R Hallpike-Dix 0  8. Up from R  0  9. Sitting, head  tipped to L knee 0  10. Head up from L  knee 0  11. Sitting, head  tipped to R knee 0  12. Head up from R  knee 1  13. Sitting head turns x5 2  14.Sitting head nods x5 2  15. In stance, 180  turn to L  2  16. In stance, 180  turn to R 2       VESTIBULAR TREATMENT:  Gaze Adaptation:   x1 Viewing Horizontal: Position: seated, Time: 30 sec, and Comment: no c/o and x1 Viewing Vertical:  Position: seated, Time: 30 sec, and Comment: no c/o   PATIENT EDUCATION: Education details: PT eval results, POC and initiation of HEP Person educated: Patient Education method: Explanation, Demonstration, Verbal cues, and Handouts Education comprehension: verbalized understanding, returned demonstration, and needs further education  Access Code: [email or web address] URL: [email or web address] Date: 08/10/2022 Prepared by: [facility]  Exercises - Seated Gaze Stabilization with Head Rotation  - 2 x daily - 7 x weekly - 2 sets - 30 sec hold - Seated Gaze Stabilization with Head Nod  - 2 x daily - 7 x weekly - 2 sets - 30 sec hold  GOALS: Goals  reviewed with patient? Yes  SHORT TERM GOALS:= LTGs    LONG TERM GOALS: Target date: 09/11/2022   Pt will be independent with HEP for improved balance and decreased c/o dizziness/unsteadiness. Baseline:  Goal status: IN PROGRESS  2.  FOTO score to improve to 58 to demo improved overall functional mobility. Baseline: 48 Goal status: IN PROGRESS  3.  Improve conditions 2 and 4 on MCTSIB to 30 seconds with mild/moderate sway for improved vestibular system use for balance. Baseline: 15-30 sec mod-severe sway Goal status: IN PROGRESS  4.  DGI to be assessed and goal to be written as appropriate.  FGA score to improve to 20/30 for decreased fall risk. Baseline: 15/30 08/14/2022 Goal status: IN PROGRESS  ASSESSMENT:  CLINICAL IMPRESSION: Pt does want to continue through current POC, as she still feels the occasional unsteadiness and wobbliness with quick motions, head turns, and spinning with dancing.  Continued to work on exercises that bring on slight unsteadiness and cue for wider BOS for turns, UE support as needed for compliant surfaces and continue performance of HEP to continue to habituate for unsteadiness.  In today's session, quick turns and stops bring on slight unsteadiness which was improved with wider BOS.  She does mention the occasional toeing in with gait, and assessed ankle strength, noting bilateral decreased ankle eversion strength.  Addressed this with exercises.  She continues to progressing towards goals and will continue to benefit from skilled PT towards goals for improved balance and functional mobility.  OBJECTIVE IMPAIRMENTS Abnormal gait, decreased balance, decreased mobility, difficulty walking, and dizziness.   ACTIVITY LIMITATIONS bending, standing, squatting, bed mobility, and locomotion level  PARTICIPATION LIMITATIONS: community activity and leisure activities  PERSONAL FACTORS  see [place] above  are also affecting patient's functional outcome.   REHAB  POTENTIAL: Good  CLINICAL DECISION MAKING: Evolving/moderate complexity  EVALUATION COMPLEXITY: Moderate   PLAN: PT FREQUENCY: 2x/week  PT DURATION: other: 5 weeks, including eval week  PLANNED INTERVENTIONS: Therapeutic exercises, Therapeutic activity, Neuromuscular re-education, Balance training, Gait training, Patient/Family education, Self Care, Vestibular training, and Canal[place] repositioning  PLAN FOR NEXT SESSION: Check LTGs and discuss d/c versus renew after next week.   Frazier Butt., PT 08/31/2022, 11:56 AM   [facility] at [facility] [address], [address] [place], [place] [address] Phone # [phone] Fax # [phone]

## 2022-09-04 ENCOUNTER — Ambulatory Visit: Payer: PPO | Admitting: Physical Therapy

## 2022-09-07 ENCOUNTER — Ambulatory Visit: Payer: PPO | Admitting: Physical Therapy

## 2022-09-07 ENCOUNTER — Encounter: Payer: Self-pay | Admitting: Physical Therapy

## 2022-09-07 DIAGNOSIS — R2681 Unsteadiness on feet: Secondary | ICD-10-CM

## 2022-09-07 DIAGNOSIS — R42 Dizziness and giddiness: Secondary | ICD-10-CM

## 2022-09-07 NOTE — Therapy (Signed)
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       PT End of Session - 09/07/22 0848     Visit Number 7    Number of Visits 9    Date for PT Re-Evaluation 10/09/22    Authorization Type [employer]    Progress Note Due on Visit --   completed at visit 7   PT Start Time 0848    PT Stop Time 0929    PT Time Calculation (min) 41 min    Activity Tolerance Patient tolerated treatment well    Behavior During Therapy [facility] for tasks assessed/performed                   Past Medical History:  Diagnosis Date   Anxiety    Arthritis    Family history of breast cancer    Family history of melanoma    Family history of pancreatic cancer    History of basal cell carcinoma ([place]) excision 2014   left mid back   History of fungal infection 2018   systemic fungal infection due to black mold exposure   Hyperlipidemia    Hypothyroidism    followed by pcp   Irregular heartbeat    per pt asymptomatic ;   last cardiology visit w/ dr Caryl Comes 07-03-2020 for abnormal EKG, Atrial ectopy/ PACs w/ NS atrial tachycardia; work-up results in epic,  event monitor 04/ 2019 showed SR with PACs,  normal echo 04/ 2019, nuclear stress test showed normal no ischemia normal LVF nuclear ef 70%   Malignant neoplasm of upper-outer quadrant of left breast in female, estrogen receptor positive ([place]) 08/2017   oncologist--- dr [name]---  left breast DCIS , ER/PR+;   S/P left mastectomy w/ node dissestion and right prophalox masterectomy 09-06-2017,  no chemo/ radiation   Neck injury    05-16-2021  per pt had neck injury approx. 3 months  ago,  had epi injeciton approx one month ago, followed by dr [name],  no rom restriction but do not hyperextend neck   Wears contact lenses    Past Surgical History:  Procedure Laterality Date   [place] Bilateral 09/06/2017   Procedure: LEFT MASTECTOMY WITH LEFT SENTINEL LYMPH NODE BIOPSY AND  RIGHT PROPHYLACTIC MASTECTOMY;  Surgeon: [name], MD;  Location: [place];  Service: General;  Laterality: Bilateral;   OOPHORECTOMY Bilateral    ROBOTIC ASSISTED SALPINGO OOPHERECTOMY Bilateral 05/20/2021   Procedure: XI ROBOTIC ASSISTED SALPINGO OOPHORECTOMY;  Surgeon: [name], MD;  Location: [place];  Service: Gynecology;  Laterality: Bilateral;   TONSILLECTOMY AND ADENOIDECTOMY     child   Patient Active Problem List   Diagnosis Date Noted   Changing skin lesion 11/11/2021   Basal cell carcinoma 08/12/2021   Atrial tachycardia (HCC) - non sustained 08/06/2021   PAC (premature atrial contraction) 08/06/2021   Preop cardiovascular exam 11/14/2019   Hyperlipidemia with target LDL less than 130 11/13/2019   Family history of breast cancer    Family history of melanoma    Family history of pancreatic cancer    Patellofemoral arthritis of  right knee 02/28/2018   Palpitations 02/10/2018   Abnormal finding on EKG 02/10/2018   Breast cancer in situ 09/06/2017   Ductal carcinoma in situ (DCIS) of left breast 08/19/2017   Malignant neoplasm of upper-outer quadrant of left breast in female, estrogen receptor positive ([place]) 08/17/2017   Low back pain 12/02/2016   Left hamstring injury 11/01/2013   Nonallopathic lesion of cervical region 11/01/2013   Nonallopathic lesion of lumbosacral region 11/01/2013   Nonallopathic lesion of thoracic region 11/01/2013    ONSET DATE: 07/20/2022 (MD referral)  REFERRING DIAG: H83.2X9 (ICD-10-CM) - Vestibular disequilibrium, unspecified laterality  THERAPY  DIAG:  Unsteadiness on feet  Dizziness and giddiness  Rationale for Evaluation and Treatment Rehabilitation  SUBJECTIVE:   SUBJECTIVE STATEMENT: Have had a rough week.  Not so much with dizziness, but with the moves/houses.  Noticed that with walking, I have trouble with depth perception.  No questions on exercises. Pt accompanied by: self  PERTINENT HISTORY:  hypothyroidism and left breast cancer (2018), arthritis, anxiety, systemic fungal infection due to black mold exposure   PAIN:  Are you having pain? No  PRECAUTIONS: Fall  WEIGHT BEARING RESTRICTIONS No  FALLS: Has patient fallen in last 6 months? Yes. Number of falls 1  LIVING ENVIRONMENT: Lives with: lives with their spouse Lives in: House/apartment Stairs: Yes: Internal: 13 steps; on right going up and External: 1 steps; none Has following equipment at home: None  PLOF: Independent and Leisure: Enjoys ballroom dancing, hiking, biking; works out regularly  [place] rid of sense of dysequilibrium  OBJECTIVE:    TODAY'S TREATMENT: 09/07/2022 Activity Comments  Stair negotiation:  3-4 reps of stairs, using rail, step-through pattern, cues for deliberate foot clearance/step placement with descending steps   FGA score:  23/30 (see below) Improved from 15/30  Standing on [place]:  head turns/nods EO, then EC, feet apart and feet together Mild sway with EC head motions  Gait with diagonal head motions Slowed pace, no LOB           M-CTSIB  Condition 1: Firm Surface, EO 30 Sec, Normal Sway  Condition 2: Firm Surface, EC 30 Sec, Normal Sway  Condition 3: Foam Surface, EO 30 Sec, Normal Sway  Condition 4: Foam Surface, EC 30 Sec, Mild Sway     [facility] PT Assessment - 09/07/22 0001       Functional Gait  Assessment   Gait assessed  Yes    Gait Level Surface Walks 20 ft in less than 7 sec but greater than 5.5 sec, uses assistive device, slower speed, mild gait deviations, or deviates 6-10 in outside of the  12 in walkway width.    Change in Gait Speed Able to smoothly change walking speed without loss of balance or gait deviation. Deviate no more than 6 in outside of the 12 in walkway width.    Gait with Horizontal Head Turns Performs head turns smoothly with slight change in gait velocity (eg, minor disruption to smooth gait path), deviates 6-10 in outside 12 in walkway width, or uses an assistive device.   9   Gait with Vertical Head Turns Performs task with slight change in gait velocity (eg, minor disruption to smooth gait path), deviates 6 - 10 in outside 12 in walkway width or uses assistive device   8 sec   Gait and Pivot Turn Pivot turns safely within 3 sec and stops quickly with no loss of balance.   1.75 sec   Step Over Obstacle  Is able to step over one shoe box (4.5 in total height) without changing gait speed. No evidence of imbalance.    Gait with Narrow Base of Support Is able to ambulate for 10 steps heel to toe with no staggering.    Gait with Eyes Closed Walks 20 ft, slow speed, abnormal gait pattern, evidence for imbalance, deviates 10-15 in outside 12 in walkway width. Requires more than 9 sec to ambulate 20 ft.   10   Ambulating Backwards Walks 20 ft, no assistive devices, good speed, no evidence for imbalance, normal gait   12.28 sec   Steps Alternating feet, must use rail.    Total Score 23    FGA comment: Improved from 15/30            Access Code: [email or web address] URL: [email or web address] Date: 09/07/2022-most recent updates Prepared by: [facility]  Program Notes Wall bumps:  stand about 1-2 ft from the wall and stick out your hips to bump the wall, then bring shoulders back to the wall.  Return to standing, bringing your shoulders forward, then your hips.  Repeat 5-10 times.Habituation exercise:  sitting with forward lean and look to the side, hold for 10 sec; then return to upright sitting until symptoms resolve plus 15-30  seconds.  Repeat 3 reps, 1-2 times per day.08/31/22:  Walk and then turn and stop.  Make sure to keep your feet wide when you turn and stop.  Exercises - Seated Gaze Stabilization with Head Rotation  - 2 x daily - 7 x weekly - 2 sets - 30 sec hold - Brandt-Daroff Vestibular Exercise  - 1-2 x daily - 7 x weekly - 1 sets - 3 reps - Standing Gaze Stabilization with Head Rotation  - 2 x daily - 7 x weekly - 1-2 sets - 30 sec hold - Standing Gaze Stabilization with Head Nod  - 2 x daily - 7 x weekly - 1-2 sets - 30 sec hold - Standing Quarter Turn with Counter Support  - 1 x daily - 7 x weekly - 1 sets - 5 reps - Standing on Foam Pad  - 1 x daily - 5 x weekly - 1 sets - 10 reps  -EC head motions - Walking with Head Rotation  - 1-2 x daily - 5 x weekly - 1 sets - 3-5 reps  With diagonal head motions - Seated Ankle Eversion with Resistance  - 1 x daily - 5 x weekly - 3 sets - 10 reps  PATIENT EDUCATION: Education details: HEP addittions, updates to POC Person educated: Patient Education method: Explanation, Demonstration, and Handouts Education comprehension: verbalized understanding and returned demonstration  ------------------------------------------------------------------------ Objective measures below from evaluation: DIAGNOSTIC FINDINGS: CT head 05/18/2022: Mild age-related atrophy.  Calcified extra-axial mass along the LEFT lateral aspect of the frontal falx, 7 x 11 x 10 mm, favor calcified meningioma.  No acute intracranial abnormalities.   COGNITION: Overall cognitive status: Within functional limits for tasks assessed   POSTURE: No Significant postural limitations   Cervical ROM:   WFL for rotation and flexion/extension Active A/PROM (deg) eval  Flexion   Extension   Right lateral flexion   Left lateral flexion   Right rotation   Left rotation   (Blank rows = not tested)   BED MOBILITY:  Independent  TRANSFERS: Independent  GAIT: Gait pattern:  guarded gait  pattern, slowed pace Distance walked: clinic distances Assistive device utilized: None Level of assistance: Complete  Independence   PATIENT SURVEYS:  FOTO 48 at intake (predicted score 58)   VESTIBULAR ASSESSMENT   GENERAL OBSERVATION: No acute distress, does report some anxiousness about this ongoing disequilibrium    SYMPTOM BEHAVIOR:   Subjective history: "Drank way too much on one evening, and then just didn't feel right several days later".  No c/o room spinning type dizziness.  Maybe very transiently.  Bending over makes it worse, when I come up   Non-Vestibular symptoms:  None   Type of dizziness: Imbalance (Disequilibrium) and Unsteady with head/body turns   Frequency: Several times per day   Duration: seconds   Aggravating factors: Induced by motion: bending down to the ground, turning body quickly, and turning head quickly   Relieving factors: slow movements and hold onto something   Progression of symptoms: better   OCULOMOTOR EXAM:   Ocular Alignment: normal   Ocular ROM: No Limitations   Spontaneous Nystagmus: absent   Gaze-Induced Nystagmus: absent and (for horizontal); noted nystagmus upon return to center from downward vertical gaze   Smooth Pursuits: intact   Saccades: intact and for horizontal movements; nystagmus upon return to center from downward vertical gaze   Convergence/Divergence: reports seeing shadow; decreased convergence distance       VESTIBULAR - OCULAR REFLEX:    Slow VOR: Normal   VOR Cancellation: Normal   Head-Impulse Test: HIT Right: positive   Dynamic Visual Acuity: Static: Line 6 Dynamic: Line 5    POSITIONAL TESTING: Right Dix-Hallpike: no nystagmus and c/o minor dizziness, goes away quickly Left Dix-Hallpike: no nystagmus Right Roll Test: no nystagmus and no symptoms Left Roll Test: no nystagmus and reports feels different, but no reports of spinning  With L Dix-hallpike, pt reports double vision and appears that R eye has  downward rotational beat  M-CTSIB  Condition 1: Firm Surface, EO 30 Sec, Normal Sway  Condition 2: Firm Surface, EC 15 Sec, Severe Sway  Condition 3: Foam Surface, EO 30 Sec, Mild Sway  Condition 4: Foam Surface, EC 30 Sec, Moderate and Severe Sway    MOTION SENSITIVITY: (Assessed 08/14/2022)    Motion Sensitivity Quotient  Intensity: 0 = none, 1 = Lightheaded, 2 = Mild, 3 = Moderate, 4 = Severe, 5 = Vomiting  Intensity  1. Sitting to supine 0  2. Supine to L side 2  3. Supine to R side 1  4. Supine to sitting 3  5. L Hallpike-Dix 0  6. Up from L  3  7. R Hallpike-Dix 0  8. Up from R  0  9. Sitting, head  tipped to L knee 0  10. Head up from L  knee 0  11. Sitting, head  tipped to R knee 0  12. Head up from R  knee 1  13. Sitting head turns x5 2  14.Sitting head nods x5 2  15. In stance, 180  turn to L  2  16. In stance, 180  turn to R 2       VESTIBULAR TREATMENT:  Gaze Adaptation:   x1 Viewing Horizontal: Position: seated, Time: 30 sec, and Comment: no c/o and x1 Viewing Vertical:  Position: seated, Time: 30 sec, and Comment: no c/o   PATIENT EDUCATION: Education details: PT eval results, POC and initiation of HEP Person educated: Patient Education method: Explanation, Demonstration, Verbal cues, and Handouts Education comprehension: verbalized understanding, returned demonstration, and needs further education  Access Code: [email or web address] URL: [email or web address] Date: 08/10/2022 Prepared by: [facility] - Outpatient  Rehab -  [facility]  Exercises - Seated Gaze Stabilization with Head Rotation  - 2 x daily - 7 x weekly - 2 sets - 30 sec hold - Seated Gaze Stabilization with Head Nod  - 2 x daily - 7 x weekly - 2 sets - 30 sec hold  GOALS: Goals reviewed with patient? Yes  SHORT TERM GOALS:= LTGs    LONG TERM GOALS: Target date: 09/11/2022   Pt will be independent with HEP for improved balance and decreased c/o  dizziness/unsteadiness. Baseline: updates provided 09/07/2022 Goal status: IN PROGRESS  2.  FOTO score to improve to 58 to demo improved overall functional mobility. Baseline: 48>52 09/07/2022 Goal status: IN PROGRESS  3.  Improve conditions 2 and 4 on MCTSIB to 30 seconds with mild/moderate sway for improved vestibular system use for balance. Baseline: normal to mild sway Goal status: 09/07/2022 GOAL MET  4.  DGI to be assessed and goal to be written as appropriate.  FGA score to improve to 20/30 for decreased fall risk. Baseline: 15/30 08/14/2022> 23/30 09/07/2022 Goal status: GOAL MET  LONG TERM GOALS: Updated Target date: 10/09/2022  Pt will be independent with progressive HEP for improved balance and decreased c/o dizziness/unsteadiness. Baseline:  Goal status: IN PROGRESS  2.  FOTO score to improve to 58 to demo improved overall functional mobility. Baseline: 52 09/07/2022 Goal status: IN PROGRESS  3.  Pt will improve FGA to at least 26/30 for decreased fall risk. Baseline: 23/30 09/07/2022 Goal status: INITIAL   ASSESSMENT:  CLINICAL IMPRESSION: Progress Note/recert completed today.  Pt subjectively reports improved overall balance and unsteadiness, but she continues to report difficulty with ambulating in dark and unsteadiness with head motions with gait.  Objective measures today:  FGA 23/30, improved from 15/30; FOTO 52 (improved from 48 at eval); mild sway with condition 4 on MCTSIB (improved from moderate/severe sway).  With LTGs, goals 1 and 2 are in progress, with pt progressing towards goals, just not yet met.  LTG 3 and 4 met.  Pt feels and PT recommends continued skilled PT towards updated LTGs to continue to address high level balance, vestibular system use for balance.    OBJECTIVE IMPAIRMENTS Abnormal gait, decreased balance, decreased mobility, difficulty walking, and dizziness.   ACTIVITY LIMITATIONS bending, standing, squatting, bed mobility, and locomotion  level  PARTICIPATION LIMITATIONS: community activity and leisure activities  PERSONAL FACTORS  see [place] above  are also affecting patient's functional outcome.   REHAB POTENTIAL: Good  CLINICAL DECISION MAKING: Evolving/moderate complexity  EVALUATION COMPLEXITY: Moderate   PLAN: PT FREQUENCY: 1x/wk every other week  PT DURATION: 2 visits, per recert 30/03/1101  PLANNED INTERVENTIONS: Therapeutic exercises, Therapeutic activity, Neuromuscular re-education, Balance training, Gait training, Patient/Family education, Self Care, Vestibular training, and [place] repositioning  PLAN FOR NEXT SESSION: Check updates to HEP; work on compliant mat surface, eyes closed, ramp/incline/decline, sidestepping with head motions  Stephana Morell W., PT 09/07/2022, 9:32 AM   [facility] [place] [address] [place], [place] [address] Phone # [phone] Fax # [phone]

## 2022-09-14 ENCOUNTER — Encounter: Payer: PPO | Admitting: Physical Therapy

## 2022-09-21 ENCOUNTER — Encounter: Payer: Self-pay | Admitting: Physical Therapy

## 2022-09-21 ENCOUNTER — Ambulatory Visit: Payer: PPO | Attending: Neurology | Admitting: Physical Therapy

## 2022-09-21 DIAGNOSIS — R2681 Unsteadiness on feet: Secondary | ICD-10-CM | POA: Insufficient documentation

## 2022-09-21 DIAGNOSIS — R42 Dizziness and giddiness: Secondary | ICD-10-CM | POA: Diagnosis not present

## 2022-09-21 NOTE — Therapy (Signed)
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 Patient goals were met. Patient is being discharged due to meeting the stated rehab goals.  [name], PT 09/21/22 8:31 AM Phone: [phone] Fax: [phone]     PT End of Session - 09/21/22 0759     Visit Number 8    Number of Visits 9    Date for PT Re-Evaluation 10/09/22    Authorization Type [employer]    Progress Note Due on Visit --   completed at visit 7   PT Start Time 0800    PT Stop Time 0825    PT Time Calculation (min) 25 min    Activity Tolerance Patient tolerated treatment well    Behavior During Therapy [facility] for tasks assessed/performed                    Past Medical History:  Diagnosis Date   Anxiety    Arthritis    Family history of breast cancer    Family history of melanoma    Family history of pancreatic cancer    History of basal cell carcinoma ([place]) excision 2014   left mid back   History of fungal infection 2018   systemic fungal infection due to black mold exposure   Hyperlipidemia    Hypothyroidism    followed by pcp   Irregular heartbeat    per pt asymptomatic ;   last cardiology visit w/ dr Caryl Comes 07-03-2020 for abnormal EKG, Atrial ectopy/ PACs w/ NS atrial tachycardia; work-up results in epic,  event monitor 04/ 2019 showed SR with PACs,  normal echo 04/ 2019, nuclear stress test showed normal no ischemia  normal LVF nuclear ef 70%   Malignant neoplasm of upper-outer quadrant of left breast in female, estrogen receptor positive ([name]) 08/2017   oncologist--- dr [name]---  left breast DCIS , ER/PR+;   S/P left mastectomy w/ node dissestion and right prophalox masterectomy 09-06-2017,  no chemo/ radiation   Neck injury    05-16-2021  per pt had neck injury approx. 3 months ago,  had epi injeciton approx one month ago, followed by dr [name],  no rom restriction but do not hyperextend neck   Wears contact lenses    Past Surgical History:  Procedure Laterality Date   [place] Bilateral 09/06/2017   Procedure: LEFT MASTECTOMY WITH LEFT SENTINEL LYMPH NODE BIOPSY AND  RIGHT PROPHYLACTIC MASTECTOMY;  Surgeon: Whitney Kussmaul, MD;  Location: [place];  Service: General;  Laterality: Bilateral;   OOPHORECTOMY Bilateral    ROBOTIC ASSISTED SALPINGO OOPHERECTOMY Bilateral 05/20/2021   Procedure: XI ROBOTIC ASSISTED SALPINGO OOPHORECTOMY;  Surgeon: Whitney Few, MD;  Location: [place];  Service: Gynecology;  Laterality: Bilateral;   TONSILLECTOMY AND ADENOIDECTOMY     child   Patient Active Problem List   Diagnosis Date Noted   Changing skin lesion 11/11/2021   Basal cell  carcinoma 08/12/2021   Atrial tachycardia (HCC) - non sustained 08/06/2021   PAC (premature atrial contraction) 08/06/2021   Preop cardiovascular exam 11/14/2019   Hyperlipidemia with target LDL less than 130 11/13/2019   Family history of breast cancer    Family history of melanoma    Family history of pancreatic cancer    Patellofemoral arthritis of right knee 02/28/2018   Palpitations 02/10/2018   Abnormal finding on EKG 02/10/2018   Breast cancer in situ 09/06/2017   Ductal carcinoma in situ (DCIS) of left breast 08/19/2017   Malignant neoplasm of upper-outer quadrant of left breast in female, estrogen receptor positive ([name])  08/17/2017   Low back pain 12/02/2016   Left hamstring injury 11/01/2013   Nonallopathic lesion of cervical region 11/01/2013   Nonallopathic lesion of lumbosacral region 11/01/2013   Nonallopathic lesion of thoracic region 11/01/2013    ONSET DATE: 07/20/2022 (MD referral)  REFERRING DIAG: H83.2X9 (ICD-10-CM) - Vestibular disequilibrium, unspecified laterality  THERAPY DIAG:  Unsteadiness on feet  Dizziness and giddiness  Rationale for Evaluation and Treatment Rehabilitation  SUBJECTIVE:   SUBJECTIVE STATEMENT: [facility] the diagonal head motions.  Doing the steps with rail.  Still feel a little wobbly when I get out of bed, but much, much better. Pt accompanied by: self  PERTINENT HISTORY:  hypothyroidism and left breast cancer (2018), arthritis, anxiety, systemic fungal infection due to black mold exposure   PAIN:  Are you having pain? No  PRECAUTIONS: Fall  WEIGHT BEARING RESTRICTIONS No  FALLS: Has patient fallen in last 6 months? Yes. Number of falls 1  LIVING ENVIRONMENT: Lives with: lives with their spouse Lives in: House/apartment Stairs: Yes: Internal: 13 steps; on right going up and External: 1 steps; none Has following equipment at home: None  PLOF: Independent and Leisure: Enjoys ballroom dancing, hiking, biking; works out regularly  [place] rid of sense of dysequilibrium  OBJECTIVE:    TODAY'S TREATMENT: 09/21/2022 Activity Comments  Reviewed HEP from last visit:  Standing on foam with EC head motions Walking with diagonal head motions Pt reports   Sidestepping with head motions 15 ft each direction No unsteadiness or LOB.  FGA :  26/30 Improved from 23/30 last session  FOTO:  score 61, improved from 54 last visit           [facility] PT Assessment - 09/21/22 0001       Functional Gait  Assessment   Gait assessed  Yes    Gait Level Surface Walks 20 ft in less than 7 sec but greater than 5.5 sec, uses assistive device,  slower speed, mild gait deviations, or deviates 6-10 in outside of the 12 in walkway width.   6.03 sec   Change in Gait Speed Able to smoothly change walking speed without loss of balance or gait deviation. Deviate no more than 6 in outside of the 12 in walkway width.    Gait with Horizontal Head Turns Performs head turns smoothly with no change in gait. Deviates no more than 6 in outside 12 in walkway width    Gait with Vertical Head Turns Performs head turns with no change in gait. Deviates no more than 6 in outside 12 in walkway width.    Gait and Pivot Turn Pivot turns safely within 3 sec and stops quickly with no loss of balance.    Step Over Obstacle Is able to step over one shoe box (4.5 in total height) without  changing gait speed. No evidence of imbalance.    Gait with Narrow Base of Support Is able to ambulate for 10 steps heel to toe with no staggering.    Gait with Eyes Closed Walks 20 ft, uses assistive device, slower speed, mild gait deviations, deviates 6-10 in outside 12 in walkway width. Ambulates 20 ft in less than 9 sec but greater than 7 sec.    Ambulating Backwards Walks 20 ft, no assistive devices, good speed, no evidence for imbalance, normal gait    Steps Alternating feet, must use rail.    Total Score 26    FGA comment: Improved from 26/30              Access Code: [email or web address] URL: [email or web address] Date: 09/07/2022-most recent updates Prepared by: [facility]  Program Notes Wall bumps:  stand about 1-2 ft from the wall and stick out your hips to bump the wall, then bring shoulders back to the wall.  Return to standing, bringing your shoulders forward, then your hips.  Repeat 5-10 times.Habituation exercise:  sitting with forward lean and look to the side, hold for 10 sec; then return to upright sitting until symptoms resolve plus 15-30 seconds.  Repeat 3 reps, 1-2 times per day.08/31/22:  Walk and then turn and  stop.  Make sure to keep your feet wide when you turn and stop.  Exercises - Seated Gaze Stabilization with Head Rotation  - 2 x daily - 7 x weekly - 2 sets - 30 sec hold - Brandt-Daroff Vestibular Exercise  - 1-2 x daily - 7 x weekly - 1 sets - 3 reps - Standing Gaze Stabilization with Head Rotation  - 2 x daily - 7 x weekly - 1-2 sets - 30 sec hold - Standing Gaze Stabilization with Head Nod  - 2 x daily - 7 x weekly - 1-2 sets - 30 sec hold - Standing Quarter Turn with Counter Support  - 1 x daily - 7 x weekly - 1 sets - 5 reps - Standing on Foam Pad  - 1 x daily - 5 x weekly - 1 sets - 10 reps  -EC head motions - Walking with Head Rotation  - 1-2 x daily - 5 x weekly - 1 sets - 3-5 reps  With diagonal head motions - Seated Ankle Eversion with Resistance  - 1 x daily - 5 x weekly - 3 sets - 10 reps  PATIENT EDUCATION: Education details: HEP addittions, updates to POC Person educated: Patient Education method: Explanation, Demonstration, and Handouts Education comprehension: verbalized understanding and returned demonstration  ------------------------------------------------------------------------ Objective measures below from evaluation: DIAGNOSTIC FINDINGS: CT head 05/18/2022: Mild age-related atrophy.  Calcified extra-axial mass along the LEFT lateral aspect of the frontal falx, 7 x 11 x 10 mm, favor calcified meningioma.  No acute intracranial abnormalities.   COGNITION: Overall cognitive status: Within functional limits for tasks assessed   POSTURE: No Significant postural limitations   Cervical ROM:   WFL for rotation and flexion/extension Active A/PROM (deg) eval  Flexion   Extension   Right lateral flexion   Left lateral flexion   Right rotation   Left rotation   (Blank rows = not tested)   BED MOBILITY:  Independent  TRANSFERS: Independent  GAIT: Gait pattern:  guarded gait pattern, slowed pace Distance walked: clinic distances Assistive device  utilized: None Level of assistance: Complete Independence   PATIENT SURVEYS:  FOTO 48 at intake (predicted score  58)   VESTIBULAR ASSESSMENT   GENERAL OBSERVATION: No acute distress, does report some anxiousness about this ongoing disequilibrium    SYMPTOM BEHAVIOR:   Subjective history: "Drank way too much on one evening, and then just didn't feel right several days later".  No c/o room spinning type dizziness.  Maybe very transiently.  Bending over makes it worse, when I come up   Non-Vestibular symptoms:  None   Type of dizziness: Imbalance (Disequilibrium) and Unsteady with head/body turns   Frequency: Several times per day   Duration: seconds   Aggravating factors: Induced by motion: bending down to the ground, turning body quickly, and turning head quickly   Relieving factors: slow movements and hold onto something   Progression of symptoms: better   OCULOMOTOR EXAM:   Ocular Alignment: normal   Ocular ROM: No Limitations   Spontaneous Nystagmus: absent   Gaze-Induced Nystagmus: absent and (for horizontal); noted nystagmus upon return to center from downward vertical gaze   Smooth Pursuits: intact   Saccades: intact and for horizontal movements; nystagmus upon return to center from downward vertical gaze   Convergence/Divergence: reports seeing shadow; decreased convergence distance       VESTIBULAR - OCULAR REFLEX:    Slow VOR: Normal   VOR Cancellation: Normal   Head-Impulse Test: HIT Right: positive   Dynamic Visual Acuity: Static: Line 6 Dynamic: Line 5    POSITIONAL TESTING: Right Dix-Hallpike: no nystagmus and c/o minor dizziness, goes away quickly Left Dix-Hallpike: no nystagmus Right Roll Test: no nystagmus and no symptoms Left Roll Test: no nystagmus and reports feels different, but no reports of spinning  With L Dix-hallpike, pt reports double vision and appears that R eye has downward rotational beat  M-CTSIB  Condition 1: Firm Surface, EO 30 Sec,  Normal Sway  Condition 2: Firm Surface, EC 15 Sec, Severe Sway  Condition 3: Foam Surface, EO 30 Sec, Mild Sway  Condition 4: Foam Surface, EC 30 Sec, Moderate and Severe Sway    MOTION SENSITIVITY: (Assessed 08/14/2022)    Motion Sensitivity Quotient  Intensity: 0 = none, 1 = Lightheaded, 2 = Mild, 3 = Moderate, 4 = Severe, 5 = Vomiting  Intensity  1. Sitting to supine 0  2. Supine to L side 2  3. Supine to R side 1  4. Supine to sitting 3  5. L Hallpike-Dix 0  6. Up from L  3  7. R Hallpike-Dix 0  8. Up from R  0  9. Sitting, head  tipped to L knee 0  10. Head up from L  knee 0  11. Sitting, head  tipped to R knee 0  12. Head up from R  knee 1  13. Sitting head turns x5 2  14.Sitting head nods x5 2  15. In stance, 180  turn to L  2  16. In stance, 180  turn to R 2       VESTIBULAR TREATMENT:  Gaze Adaptation:   x1 Viewing Horizontal: Position: seated, Time: 30 sec, and Comment: no c/o and x1 Viewing Vertical:  Position: seated, Time: 30 sec, and Comment: no c/o   PATIENT EDUCATION: Education details: PT eval results, POC and initiation of HEP Person educated: Patient Education method: Explanation, Demonstration, Verbal cues, and Handouts Education comprehension: verbalized understanding, returned demonstration, and needs further education  Access Code: [email or web address] URL: [email or web address] Date: 08/10/2022 Prepared by: [facility]  Exercises - Seated Gaze Stabilization with Head  Rotation  - 2 x daily - 7 x weekly - 2 sets - 30 sec hold - Seated Gaze Stabilization with Head Nod  - 2 x daily - 7 x weekly - 2 sets - 30 sec hold  GOALS: Goals reviewed with patient? Yes  SHORT TERM GOALS:= LTGs    LONG TERM GOALS: Updated Target date: 10/09/2022  Pt will be independent with progressive HEP for improved balance and decreased c/o dizziness/unsteadiness. Baseline:  Goal status: GOAL MET  2.  FOTO score to  improve to 58 to demo improved overall functional mobility. Baseline: 52 09/07/2022>61 09/21/2022 Goal status: GOAL MET  3.  Pt will improve FGA to at least 26/30 for decreased fall risk. Baseline: 23/30 09/07/2022>26/30 09/21/2022 Goal status: GOAL MET   ASSESSMENT:  CLINICAL IMPRESSION: Pt returns to OPPT today feeling good since last visit.  She doesn't have any major concerns, and continues to report slight challenge with exercises.  With review of EC on compliant surfaces with head motions, she does have slight unsteadiness, but reports improvement even with this.  She feels she is ready for d/c this visit.  LTGs assessed today, with pt meeting all 3 of 3 LTGs.  She has improved overall balance and has less reports of dizziness and unsteadiness with functional activities.  She is appropriate for d/c at this time.  OBJECTIVE IMPAIRMENTS Abnormal gait, decreased balance, decreased mobility, difficulty walking, and dizziness.   ACTIVITY LIMITATIONS bending, standing, squatting, bed mobility, and locomotion level  PARTICIPATION LIMITATIONS: community activity and leisure activities  PERSONAL FACTORS  see [place] above  are also affecting patient's functional outcome.   REHAB POTENTIAL: Good  CLINICAL DECISION MAKING: Evolving/moderate complexity  EVALUATION COMPLEXITY: Moderate   PLAN: PT FREQUENCY: 1x/wk every other week  PT DURATION: 2 visits, per recert [id number]  PLANNED INTERVENTIONS: Therapeutic exercises, Therapeutic activity, Neuromuscular re-education, Balance training, Gait training, Patient/Family education, Self Care, Vestibular training, and [place] repositioning  PLAN FOR NEXT SESSION: D/C this visit.  Frazier Butt., PT 09/21/2022, 8:29 AM   [facility] at [facility] [place], [place], [place] [address] Phone # [phone] Fax # [phone]

## 2022-10-05 ENCOUNTER — Encounter: Payer: PPO | Admitting: Physical Therapy

## 2022-10-08 DIAGNOSIS — M9901 Segmental and somatic dysfunction of cervical region: Secondary | ICD-10-CM | POA: Diagnosis not present

## 2022-10-08 DIAGNOSIS — M4722 Other spondylosis with radiculopathy, cervical region: Secondary | ICD-10-CM | POA: Diagnosis not present

## 2022-10-08 DIAGNOSIS — M542 Cervicalgia: Secondary | ICD-10-CM | POA: Diagnosis not present

## 2022-10-16 ENCOUNTER — Other Ambulatory Visit: Payer: Self-pay | Admitting: Sports Medicine

## 2022-10-16 DIAGNOSIS — M47812 Spondylosis without myelopathy or radiculopathy, cervical region: Secondary | ICD-10-CM

## 2022-10-19 DIAGNOSIS — M4722 Other spondylosis with radiculopathy, cervical region: Secondary | ICD-10-CM | POA: Diagnosis not present

## 2022-10-19 DIAGNOSIS — M9901 Segmental and somatic dysfunction of cervical region: Secondary | ICD-10-CM | POA: Diagnosis not present

## 2022-10-19 DIAGNOSIS — M546 Pain in thoracic spine: Secondary | ICD-10-CM | POA: Diagnosis not present

## 2022-10-19 DIAGNOSIS — M9908 Segmental and somatic dysfunction of rib cage: Secondary | ICD-10-CM | POA: Diagnosis not present

## 2022-10-19 DIAGNOSIS — M9902 Segmental and somatic dysfunction of thoracic region: Secondary | ICD-10-CM | POA: Diagnosis not present

## 2022-10-19 DIAGNOSIS — M9907 Segmental and somatic dysfunction of upper extremity: Secondary | ICD-10-CM | POA: Diagnosis not present

## 2022-10-23 ENCOUNTER — Ambulatory Visit
Admission: RE | Admit: 2022-10-23 | Discharge: 2022-10-23 | Disposition: A | Discharge status: Home / Self Care | Payer: PPO | Source: Ambulatory Visit | Attending: Sports Medicine | Admitting: Sports Medicine

## 2022-10-23 DIAGNOSIS — M47812 Spondylosis without myelopathy or radiculopathy, cervical region: Secondary | ICD-10-CM | POA: Diagnosis not present

## 2022-10-23 MED ORDER — TRIAMCINOLONE ACETONIDE 40 MG/ML IJ SUSP (RADIOLOGY)
60.0000 mg | Freq: Once | INTRAMUSCULAR | Status: AC
  Administered 2022-10-23: 60 mg via EPIDURAL

## 2022-10-23 MED ORDER — IOPAMIDOL (ISOVUE-M 300) INJECTION 61%
1.0000 mL | Freq: Once | INTRAMUSCULAR | Status: AC
  Administered 2022-10-23: 1 mL via EPIDURAL

## 2022-10-23 NOTE — Discharge Instructions (Signed)

## 2022-10-30 DIAGNOSIS — M85852 Other specified disorders of bone density and structure, left thigh: Secondary | ICD-10-CM | POA: Diagnosis not present

## 2022-10-30 DIAGNOSIS — Z780 Asymptomatic menopausal state: Secondary | ICD-10-CM | POA: Diagnosis not present

## 2022-11-05 DIAGNOSIS — D239 Other benign neoplasm of skin, unspecified: Secondary | ICD-10-CM | POA: Diagnosis not present

## 2022-11-05 DIAGNOSIS — L578 Other skin changes due to chronic exposure to nonionizing radiation: Secondary | ICD-10-CM | POA: Diagnosis not present

## 2022-11-05 DIAGNOSIS — D485 Neoplasm of uncertain behavior of skin: Secondary | ICD-10-CM | POA: Diagnosis not present

## 2022-11-05 DIAGNOSIS — L814 Other melanin hyperpigmentation: Secondary | ICD-10-CM | POA: Diagnosis not present

## 2022-11-05 DIAGNOSIS — Z808 Family history of malignant neoplasm of other organs or systems: Secondary | ICD-10-CM | POA: Diagnosis not present

## 2022-11-05 DIAGNOSIS — C44712 Basal cell carcinoma of skin of right lower limb, including hip: Secondary | ICD-10-CM | POA: Diagnosis not present

## 2022-11-05 DIAGNOSIS — D225 Melanocytic nevi of trunk: Secondary | ICD-10-CM | POA: Diagnosis not present

## 2022-11-05 DIAGNOSIS — Z85828 Personal history of other malignant neoplasm of skin: Secondary | ICD-10-CM | POA: Diagnosis not present

## 2022-11-05 DIAGNOSIS — L821 Other seborrheic keratosis: Secondary | ICD-10-CM | POA: Diagnosis not present

## 2022-11-16 DIAGNOSIS — M9902 Segmental and somatic dysfunction of thoracic region: Secondary | ICD-10-CM | POA: Diagnosis not present

## 2022-11-16 DIAGNOSIS — M9908 Segmental and somatic dysfunction of rib cage: Secondary | ICD-10-CM | POA: Diagnosis not present

## 2022-11-16 DIAGNOSIS — M9901 Segmental and somatic dysfunction of cervical region: Secondary | ICD-10-CM | POA: Diagnosis not present

## 2022-11-16 DIAGNOSIS — M542 Cervicalgia: Secondary | ICD-10-CM | POA: Diagnosis not present

## 2022-11-16 DIAGNOSIS — M9907 Segmental and somatic dysfunction of upper extremity: Secondary | ICD-10-CM | POA: Diagnosis not present

## 2022-11-16 DIAGNOSIS — M25511 Pain in right shoulder: Secondary | ICD-10-CM | POA: Diagnosis not present

## 2022-11-18 DIAGNOSIS — R300 Dysuria: Secondary | ICD-10-CM | POA: Diagnosis not present

## 2022-11-18 DIAGNOSIS — N950 Postmenopausal bleeding: Secondary | ICD-10-CM | POA: Diagnosis not present

## 2022-11-18 DIAGNOSIS — N951 Menopausal and female climacteric states: Secondary | ICD-10-CM | POA: Diagnosis not present

## 2022-11-26 DIAGNOSIS — R790 Abnormal level of blood mineral: Secondary | ICD-10-CM | POA: Diagnosis not present

## 2022-11-26 DIAGNOSIS — M2550 Pain in unspecified joint: Secondary | ICD-10-CM | POA: Diagnosis not present

## 2022-11-26 DIAGNOSIS — E039 Hypothyroidism, unspecified: Secondary | ICD-10-CM | POA: Diagnosis not present

## 2022-11-26 DIAGNOSIS — D809 Immunodeficiency with predominantly antibody defects, unspecified: Secondary | ICD-10-CM | POA: Diagnosis not present

## 2022-11-26 DIAGNOSIS — E349 Endocrine disorder, unspecified: Secondary | ICD-10-CM | POA: Diagnosis not present

## 2022-11-26 DIAGNOSIS — E2749 Other adrenocortical insufficiency: Secondary | ICD-10-CM | POA: Diagnosis not present

## 2022-11-26 DIAGNOSIS — R7989 Other specified abnormal findings of blood chemistry: Secondary | ICD-10-CM | POA: Diagnosis not present

## 2022-12-04 DIAGNOSIS — N950 Postmenopausal bleeding: Secondary | ICD-10-CM | POA: Diagnosis not present

## 2022-12-04 DIAGNOSIS — R9389 Abnormal findings on diagnostic imaging of other specified body structures: Secondary | ICD-10-CM | POA: Diagnosis not present

## 2022-12-04 DIAGNOSIS — N840 Polyp of corpus uteri: Secondary | ICD-10-CM | POA: Diagnosis not present

## 2022-12-04 DIAGNOSIS — N859 Noninflammatory disorder of uterus, unspecified: Secondary | ICD-10-CM | POA: Diagnosis not present

## 2022-12-07 ENCOUNTER — Other Ambulatory Visit: Payer: Self-pay | Admitting: Obstetrics and Gynecology

## 2022-12-07 ENCOUNTER — Encounter (HOSPITAL_BASED_OUTPATIENT_CLINIC_OR_DEPARTMENT_OTHER): Payer: Self-pay | Admitting: Obstetrics and Gynecology

## 2022-12-07 NOTE — Progress Notes (Addendum)
Spoke w/ via phone for pre-op interview---[facility] Lab needs dos----  BMET, T&S and CBC per surgeon.            Lab results------EKG current dated 05/2022 in [place] test -----patient states asymptomatic no test needed Arrive at -------0730 NPO after MN NO Solid Food.  Clear liquids from MN until---0630 Med rec completed Medications to take morning of surgery -----Cytomel and Levothyroxine Diabetic medication ----- Patient instructed no nail polish to be worn day of surgery Patient instructed to bring photo id and insurance card day of surgery Patient aware to have Driver (ride ) / caregiver  Sallye Ober  for 24 hours after surgery  Patient Special Instructions ----- Pre-Op special Istructions ----- Patient verbalized understanding of instructions that were given at this phone interview. Patient denies shortness of breath, chest pain, fever, cough at this phone interview.

## 2022-12-08 ENCOUNTER — Ambulatory Visit (HOSPITAL_BASED_OUTPATIENT_CLINIC_OR_DEPARTMENT_OTHER)
Admission: RE | Admit: 2022-12-08 | Discharge: 2022-12-08 | Disposition: A | Discharge status: Home / Self Care | Payer: PPO | Source: Ambulatory Visit | Attending: Obstetrics and Gynecology | Admitting: Obstetrics and Gynecology

## 2022-12-08 ENCOUNTER — Encounter (HOSPITAL_BASED_OUTPATIENT_CLINIC_OR_DEPARTMENT_OTHER)
Admission: RE | Disposition: A | Discharge status: Home / Self Care | Payer: Self-pay | Source: Ambulatory Visit | Attending: Obstetrics and Gynecology

## 2022-12-08 ENCOUNTER — Ambulatory Visit (HOSPITAL_BASED_OUTPATIENT_CLINIC_OR_DEPARTMENT_OTHER): Payer: PPO | Admitting: Anesthesiology

## 2022-12-08 ENCOUNTER — Encounter (HOSPITAL_BASED_OUTPATIENT_CLINIC_OR_DEPARTMENT_OTHER): Payer: Self-pay | Admitting: Obstetrics and Gynecology

## 2022-12-08 ENCOUNTER — Other Ambulatory Visit: Payer: Self-pay

## 2022-12-08 DIAGNOSIS — N840 Polyp of corpus uteri: Secondary | ICD-10-CM | POA: Insufficient documentation

## 2022-12-08 DIAGNOSIS — N858 Other specified noninflammatory disorders of uterus: Secondary | ICD-10-CM | POA: Diagnosis not present

## 2022-12-08 DIAGNOSIS — Z87891 Personal history of nicotine dependence: Secondary | ICD-10-CM | POA: Insufficient documentation

## 2022-12-08 DIAGNOSIS — E039 Hypothyroidism, unspecified: Secondary | ICD-10-CM | POA: Diagnosis not present

## 2022-12-08 DIAGNOSIS — N950 Postmenopausal bleeding: Secondary | ICD-10-CM | POA: Insufficient documentation

## 2022-12-08 HISTORY — PX: DILATATION & CURETTAGE/HYSTEROSCOPY WITH MYOSURE: SHX6511

## 2022-12-08 LAB — CBC
HCT: 39.2 % (ref 36.0–46.0)
Hemoglobin: 12.5 g/dL (ref 12.0–15.0)
MCH: 30.6 pg (ref 26.0–34.0)
MCHC: 31.9 g/dL (ref 30.0–36.0)
MCV: 95.8 fL (ref 80.0–100.0)
Platelets: 287 10*3/uL (ref 150–400)
RBC: 4.09 MIL/uL (ref 3.87–5.11)
RDW: 13.5 % (ref 11.5–15.5)
WBC: 7.2 10*3/uL (ref 4.0–10.5)
nRBC: 0 % (ref 0.0–0.2)

## 2022-12-08 LAB — BASIC METABOLIC PANEL
Anion gap: 8 (ref 5–15)
BUN: 24 mg/dL — ABNORMAL HIGH (ref 8–23)
CO2: 26 mmol/L (ref 22–32)
Calcium: 9.2 mg/dL (ref 8.9–10.3)
Chloride: 107 mmol/L (ref 98–111)
Creatinine, Ser: 0.79 mg/dL (ref 0.44–1.00)
GFR, Estimated: >60 mL/min (ref >60)
Glucose, Bld: 114 mg/dL — ABNORMAL HIGH (ref 70–99)
Potassium: 4.4 mmol/L (ref 3.5–5.1)
Sodium: 141 mmol/L (ref 135–145)

## 2022-12-08 LAB — TYPE AND SCREEN
ABO/RH(D): A POS
Antibody Screen: NEGATIVE

## 2022-12-08 SURGERY — DILATATION & CURETTAGE/HYSTEROSCOPY WITH MYOSURE
Anesthesia: General | Site: Vagina

## 2022-12-08 MED ORDER — LIDOCAINE HCL (PF) 2 % IJ SOLN
INTRAMUSCULAR | Status: AC
  Filled 2022-12-08: qty 5

## 2022-12-08 MED ORDER — HYDROMORPHONE HCL 1 MG/ML IJ SOLN: 0.2500 mg | INTRAMUSCULAR | Status: DC | PRN

## 2022-12-08 MED ORDER — PROPOFOL 10 MG/ML IV BOLUS
INTRAVENOUS | Status: AC
  Filled 2022-12-08: qty 20

## 2022-12-08 MED ORDER — ACETAMINOPHEN 500 MG PO TABS
ORAL_TABLET | ORAL | Status: AC
  Filled 2022-12-08: qty 2

## 2022-12-08 MED ORDER — PHENYLEPHRINE HCL (PRESSORS) 10 MG/ML IV SOLN
INTRAVENOUS | Status: DC | PRN
  Administered 2022-12-08: 160 ug via INTRAVENOUS

## 2022-12-08 MED ORDER — MIDAZOLAM HCL 5 MG/5ML IJ SOLN
INTRAMUSCULAR | Status: DC | PRN
  Administered 2022-12-08: 1 mg via INTRAVENOUS

## 2022-12-08 MED ORDER — VASOPRESSIN 20 UNIT/ML IV SOLN
INTRAVENOUS | Status: DC | PRN
  Administered 2022-12-08: 18 mL via SURGICAL_CAVITY

## 2022-12-08 MED ORDER — POVIDONE-IODINE 10 % EX SWAB: 2.0000 | Freq: Once | CUTANEOUS | Status: DC

## 2022-12-08 MED ORDER — BUPIVACAINE HCL (PF) 0.25 % IJ SOLN
INTRAMUSCULAR | Status: DC | PRN
  Administered 2022-12-08: 30 mL

## 2022-12-08 MED ORDER — OXYCODONE HCL 5 MG/5ML PO SOLN: 5.0000 mg | Freq: Once | ORAL | Status: DC | PRN

## 2022-12-08 MED ORDER — DEXAMETHASONE SODIUM PHOSPHATE 10 MG/ML IJ SOLN
INTRAMUSCULAR | Status: DC | PRN
  Administered 2022-12-08: 10 mg via INTRAVENOUS

## 2022-12-08 MED ORDER — FENTANYL CITRATE (PF) 100 MCG/2ML IJ SOLN
INTRAMUSCULAR | Status: AC
  Filled 2022-12-08: qty 2

## 2022-12-08 MED ORDER — LACTATED RINGERS IV SOLN: INTRAVENOUS | Status: DC

## 2022-12-08 MED ORDER — CEFAZOLIN SODIUM-DEXTROSE 2-4 GM/100ML-% IV SOLN
2.0000 g | INTRAVENOUS | Status: AC
  Administered 2022-12-08: 2 g via INTRAVENOUS

## 2022-12-08 MED ORDER — MIDAZOLAM HCL 2 MG/2ML IJ SOLN
INTRAMUSCULAR | Status: AC
  Filled 2022-12-08: qty 2

## 2022-12-08 MED ORDER — SODIUM CHLORIDE 0.9 % IR SOLN
Status: DC | PRN
  Administered 2022-12-08: 1000 mL

## 2022-12-08 MED ORDER — ONDANSETRON HCL 4 MG/2ML IJ SOLN
INTRAMUSCULAR | Status: AC
  Filled 2022-12-08: qty 2

## 2022-12-08 MED ORDER — CEFAZOLIN SODIUM-DEXTROSE 2-4 GM/100ML-% IV SOLN
INTRAVENOUS | Status: AC
  Filled 2022-12-08: qty 100

## 2022-12-08 MED ORDER — ACETAMINOPHEN 500 MG PO TABS
1000.0000 mg | ORAL_TABLET | Freq: Once | ORAL | Status: AC
  Administered 2022-12-08: 1000 mg via ORAL

## 2022-12-08 MED ORDER — ONDANSETRON HCL 4 MG/2ML IJ SOLN
INTRAMUSCULAR | Status: DC | PRN
  Administered 2022-12-08: 4 mg via INTRAVENOUS

## 2022-12-08 MED ORDER — OXYCODONE HCL 5 MG PO TABS: 5.0000 mg | ORAL_TABLET | Freq: Once | ORAL | Status: DC | PRN

## 2022-12-08 MED ORDER — LIDOCAINE 2% (20 MG/ML) 5 ML SYRINGE
INTRAMUSCULAR | Status: DC | PRN
  Administered 2022-12-08: 40 mg via INTRAVENOUS

## 2022-12-08 MED ORDER — FENTANYL CITRATE (PF) 100 MCG/2ML IJ SOLN
INTRAMUSCULAR | Status: DC | PRN
  Administered 2022-12-08 (×2): 50 ug via INTRAVENOUS

## 2022-12-08 MED ORDER — ONDANSETRON HCL 4 MG/2ML IJ SOLN: 4.0000 mg | Freq: Once | INTRAMUSCULAR | Status: DC | PRN

## 2022-12-08 MED ORDER — AMISULPRIDE (ANTIEMETIC) 5 MG/2ML IV SOLN: 10.0000 mg | Freq: Once | INTRAVENOUS | Status: DC | PRN

## 2022-12-08 MED ORDER — PROPOFOL 10 MG/ML IV BOLUS
INTRAVENOUS | Status: DC | PRN
  Administered 2022-12-08: 100 mg via INTRAVENOUS

## 2022-12-08 MED ORDER — DEXAMETHASONE SODIUM PHOSPHATE 10 MG/ML IJ SOLN
INTRAMUSCULAR | Status: AC
  Filled 2022-12-08: qty 1

## 2022-12-08 MED ORDER — KETOROLAC TROMETHAMINE 30 MG/ML IJ SOLN: 15.0000 mg | Freq: Once | INTRAMUSCULAR | Status: DC | PRN

## 2022-12-08 SURGICAL SUPPLY — 18 items
CATH ROBINSON RED A/P 16FR (CATHETERS) ×1 IMPLANT
DEVICE MYOSURE LITE (MISCELLANEOUS) IMPLANT
DEVICE MYOSURE REACH (MISCELLANEOUS) IMPLANT
DRSG TELFA 3X8 NADH STRL (GAUZE/BANDAGES/DRESSINGS) ×1 IMPLANT
GAUZE 4X4 16PLY ~~LOC~~+RFID DBL (SPONGE) ×1 IMPLANT
GLOVE BIO SURGEON STRL SZ7.5 (GLOVE) ×1 IMPLANT
GLOVE BIOGEL PI IND STRL 7.0 (GLOVE) ×1 IMPLANT
GOWN STRL REUS W/TWL LRG LVL3 (GOWN DISPOSABLE) ×2 IMPLANT
IV NS IRRIG 3000ML ARTHROMATIC (IV SOLUTION) ×1 IMPLANT
KIT PROCEDURE FLUENT (KITS) ×1 IMPLANT
KIT TURNOVER CYSTO (KITS) ×1 IMPLANT
NDL SPNL 22GX3.5 QUINCKE BK (NEEDLE) ×1 IMPLANT
NEEDLE SPNL 22GX3.5 QUINCKE BK (NEEDLE) ×1 IMPLANT
PACK VAGINAL MINOR WOMEN LF (CUSTOM PROCEDURE TRAY) ×1 IMPLANT
PAD OB MATERNITY 4.3X12.25 (PERSONAL CARE ITEMS) ×1 IMPLANT
SEAL CERVICAL OMNI LOK (ABLATOR) IMPLANT
SEAL ROD LENS SCOPE MYOSURE (ABLATOR) ×1 IMPLANT
SPIKE FLUID TRANSFER (MISCELLANEOUS) ×2 IMPLANT

## 2022-12-08 NOTE — H&P (Signed)
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 PMP bleeding with structural lesion  Pertinent Gynecological History: Menses: post-menopausal Bleeding: post menopausal bleeding Contraception: none DES exposure: denies Blood transfusions: none Sexually transmitted diseases: no past history Previous GYN Procedures: DNC  Last mammogram: normal Date: 2023 Last pap: normal Date: 2023 OB History: G0, P0   Menstrual History: Menarche age: 22 No LMP recorded. Patient is postmenopausal.    Past Medical History:  Diagnosis Date   Anxiety    Arthritis    Family history of breast cancer    Family history of melanoma    Family history of pancreatic cancer    History of basal cell carcinoma ([place]) excision 2014   left mid back   History of fungal infection 2018   systemic fungal infection due to black mold exposure   Hyperlipidemia    Hypothyroidism    followed by pcp   Irregular heartbeat    per pt asymptomatic ;   last cardiology visit w/ dr Caryl Comes 07-03-2020 for abnormal EKG, Atrial ectopy/ PACs w/ NS atrial tachycardia; work-up results in epic,  event monitor 04/ 2019 showed SR with PACs,  normal echo 04/ 2019, nuclear stress test showed normal no ischemia normal LVF nuclear ef 70%   Malignant neoplasm of upper-outer quadrant of left breast in female, estrogen receptor positive ([place]) 08/2017   oncologist--- dr [name]---  left breast DCIS , ER/PR+;   S/P left mastectomy w/ node dissestion and right prophalox masterectomy 09-06-2017,  no chemo/ radiation   Neck injury    05-16-2021  per pt had neck injury approx. 3 months ago,  had epi injeciton approx one month ago, followed by dr [name],  no rom restriction but do not hyperextend neck   Wears contact lenses     Past Surgical History:  Procedure Laterality Date   [place] Bilateral 09/06/2017   Procedure: LEFT MASTECTOMY WITH LEFT SENTINEL LYMPH NODE BIOPSY AND   RIGHT PROPHYLACTIC MASTECTOMY;  Surgeon: [name], MD;  Location: [place];  Service: General;  Laterality: Bilateral;   OOPHORECTOMY Bilateral    ROBOTIC ASSISTED SALPINGO OOPHERECTOMY Bilateral 05/20/2021   Procedure: XI ROBOTIC ASSISTED SALPINGO OOPHORECTOMY;  Surgeon: [name], MD;  Location: [place];  Service: Gynecology;  Laterality: Bilateral;   TONSILLECTOMY AND ADENOIDECTOMY     child    Family History  Problem Relation Age of Onset   Hyperlipidemia Mother    Breast cancer Mother        bilateral mastectomy   Melanoma Mother 65   Hyperlipidemia Father    Prostate cancer Father 18   Lung cancer Father    Birth defects Maternal Grandmother    Breast cancer Maternal Grandmother        dx in her 4s   Birth defects Paternal Grandmother    Congestive Heart Failure Paternal Grandmother    Heart disease Sister        short QT   Other Sister        ATM+   Lung cancer Maternal Grandfather    Heart attack Paternal Grandfather 66   Cancer Sister        Anal cancer   Other Sister        ATM-   Healthy Sister    Other Maternal Aunt        double mastectomy - unsure if had breast cancer   Breast cancer  Other    Pancreatic cancer Other 100    Social History:  reports that she quit smoking about 33 years ago. Her smoking use included cigarettes. She has a 10.00 pack-year smoking history. She has never used smokeless tobacco. She reports current alcohol use. She reports that she does not use drugs.  Allergies:  Allergies  Allergen Reactions   Sulfa Antibiotics Rash    No medications prior to admission.    Review of Systems  Constitutional: Negative.   Genitourinary:  Positive for vaginal bleeding.  All other systems reviewed and are negative.   There were no vitals taken for this visit. Physical Exam Constitutional:      Appearance: Normal appearance. She is normal weight.  HENT:     Head: Normocephalic and atraumatic.   Cardiovascular:     Rate and Rhythm: Normal rate and regular rhythm.     Pulses: Normal pulses.     Heart sounds: Normal heart sounds.  Pulmonary:     Effort: Pulmonary effort is normal.     Breath sounds: Normal breath sounds.  Abdominal:     General: Bowel sounds are normal.     Palpations: Abdomen is soft.  Genitourinary:    General: Normal vulva.  Musculoskeletal:        General: Normal range of motion.     Cervical back: Normal range of motion and neck supple.  Skin:    General: Skin is warm and dry.  Neurological:     General: No focal deficit present.     Mental Status: She is alert and oriented to person, place, and time.  Psychiatric:        Mood and Affect: Mood normal.        Behavior: Behavior normal.     No results found for this or any previous visit (from the past 24 hour(s)).  No results found.  Assessment/Plan: PMP bleeding with structural lesion Diag HS , D&C with myosure. Surgical risks noted. Risks of anesthesia , infection, injury to surrounding organs with need for repair discussed. Consent done.  Natlie Asfour J 12/08/2022, 6:37 AM

## 2022-12-08 NOTE — Discharge Instructions (Addendum)
  Post Anesthesia Home Care Instructions  Activity: Get plenty of rest for the remainder of the day. A responsible individual must stay with you for 24 hours following the procedure.  For the next 24 hours, DO NOT: -Drive a car -Paediatric nurse -Drink alcoholic beverages -Take any medication unless instructed by your physician -Make any legal decisions or sign important papers.  Meals: Start with liquid foods such as gelatin or soup. Progress to regular foods as tolerated. Avoid greasy, spicy, heavy foods. If nausea and/or vomiting occur, drink only clear liquids until the nausea and/or vomiting subsides. Call your physician if vomiting continues.  Special Instructions/Symptoms: Your throat may feel dry or sore from the anesthesia or the breathing tube placed in your throat during surgery. If this causes discomfort, gargle with warm salt water. The discomfort should disappear within 24 hours.    DISCHARGE INSTRUCTIONS: HYSTEROSCOPY  The following instructions have been prepared to help you care for yourself upon your return home.  May take stool softner while taking narcotic pain medication to prevent constipation.  Drink plenty of water.  Personal hygiene:  Use sanitary pads for vaginal drainage, not tampons.  Shower the day after your procedure.  NO tub baths, pools or Jacuzzis for 2-3 weeks.  Wipe front to back after using the bathroom.  Activity and limitations:  Do NOT drive or operate any equipment for 24 hours. The effects of anesthesia are still present and drowsiness may result.  Do NOT rest in bed all day.  Walking is encouraged.  Walk up and down stairs slowly.  You may resume your normal activity in one to two days or as indicated by your physician. Sexual activity: NO intercourse for at least 2 weeks after the procedure, or as indicated by your Doctor.  Diet: Eat a light meal as desired this evening. You may resume your usual diet tomorrow.  Return to Work: You  may resume your work activities in one to two days or as indicated by Marine scientist.  What to expect after your surgery: Expect to have vaginal bleeding/discharge for 2-3 days and spotting for up to 10 days. It is not unusual to have soreness for up to 1-2 weeks. You may have a slight burning sensation when you urinate for the first day. Mild cramps may continue for a couple of days. You may have a regular period in 2-6 weeks.  Call your doctor for any of the following:  Excessive vaginal bleeding or clotting, saturating and changing one pad every hour.  Inability to urinate 6 hours after discharge from hospital.  Pain not relieved by pain medication.  Fever of 100.4 F or greater.  Unusual vaginal discharge or odor.  May take Tylenol beginning at 2 PM as needed for cramping/soreness.

## 2022-12-08 NOTE — Anesthesia Preprocedure Evaluation (Addendum)
Anesthesia Evaluation  Patient identified by MRN, date of birth, ID band Patient awake    Reviewed: Allergy & Precautions, NPO status , Patient's Chart, lab work & pertinent test results  Airway Mallampati: I  TM Distance: >3 FB Neck ROM: Full    Dental no notable dental hx.    Pulmonary former smoker Quit smoking 1991, 10 pack year history    Pulmonary exam normal breath sounds clear to auscultation       Cardiovascular Normal cardiovascular exam+ dysrhythmias  Rhythm:Regular Rate:Normal  Hx ectopy inc PACs for which she saw cardiology, normal echo and stress test 2019   Neuro/Psych  PSYCHIATRIC DISORDERS Anxiety     negative neurological ROS     GI/Hepatic negative GI ROS, Neg liver ROS,,,  Endo/Other  Hypothyroidism    Renal/GU negative Renal ROS  Female GU complaint     Musculoskeletal  (+) Arthritis , Osteoarthritis,    Abdominal   Peds  Hematology Systemic fungal infection, being treated w/ IgG   Anesthesia Other Findings   Reproductive/Obstetrics negative OB ROS                             Anesthesia Physical Anesthesia Plan  ASA: 1  Anesthesia Plan: General   Post-op Pain Management: Tylenol PO (pre-op)*   Induction: Intravenous  PONV Risk Score and Plan: 4 or greater and Ondansetron, Dexamethasone, Midazolam and Treatment may vary due to age or medical condition  Airway Management Planned: LMA  Additional Equipment: None  Intra-op Plan:   Post-operative Plan: Extubation in OR  Informed Consent: I have reviewed the patients History and Physical, chart, labs and discussed the procedure including the risks, benefits and alternatives for the proposed anesthesia with the patient or authorized representative who has indicated his/her understanding and acceptance.     Dental advisory given  Plan Discussed with: CRNA  Anesthesia Plan Comments:        Anesthesia  Quick Evaluation

## 2022-12-08 NOTE — Op Note (Signed)
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  PRE-OPERATIVE DIAGNOSIS:  Postmenopausal Bleeding  POST-OPERATIVE DIAGNOSIS:  Postmenopausal Bleeding and endometrial polyp  PROCEDURE:  Procedure(s): DILATATION & CURETTAGE HYSTEROSCOPY WITH MYOSURE RESECTION OF ENDOMETRIAL POLYP  SURGEON:  Surgeon(s): [name], MD  ASSISTANTS: none   ANESTHESIA:   local and general  ESTIMATED BLOOD LOSS: MINIMAL   DRAINS: none   LOCAL MEDICATIONS USED:  MARCAINE    and Amount: 30 ml  SPECIMEN:  Source of Specimen:  [facility] AND POLYP  DISPOSITION OF SPECIMEN:  PATHOLOGY  COUNTS:  YES  DICTATION #: [id number]  PLAN OF CARE: DC HOME  PATIENT DISPOSITION:  PACU - hemodynamically stable.

## 2022-12-08 NOTE — Op Note (Signed)
NAMEKHAMORA, Whitney [name] FACILITY: [facility] LOCATION: [facility] PHYSICIAN: Lovenia Kim, MD  Operative Report   DATE OF PROCEDURE: 12/08/2022  PREOPERATIVE DIAGNOSIS:  Postmenopausal bleeding with structural lesion.  POSTOPERATIVE DIAGNOSIS:  Postmenopausal bleeding with structural lesion plus endometrial polyp.  PROCEDURE:  Diagnostic hysteroscopy, D and C, MyoSure resection of large left lateral wall endometrial polyp.  SURGEON:  Lovenia Kim, MD.  ASSISTANT:  None.  ANESTHESIA:  Local, general.  ESTIMATED BLOOD LOSS:  Less than 50 mL.  FLUID DEFICIT:  70 mL  COMPLICATIONS:  None.  DRAINS:  None.  COUNTS: Correct.  DISPOSITION:  The patient to recovery in good condition.  BRIEF OPERATIVE NOTE: After being apprised of the risks of anesthesia, infection, bleeding, injury to surrounding organs, possible need for repair, delayed versus immediate complications to include bowel and bladder injury, possible need for repair the  patient was brought to the operating room.  She was administered a general anesthetic without complications.  She was prepped and draped in usual sterile fashion, catheterized until the bladder was empty.  Exam under anesthesia reveals a small anteflexed  uterus and no adnexal masses.  Dilute Pitressin solution placed 3 and 9 o'clock, 20 mL total dilute Marcaine solution placed.  Standard paracervical block, 30 mL total.  Cervix easily dilated up to a #19 Pratt dilator.  Hysteroscope placed.   Visualization reveals a large left lateral wall endometrial polyp and otherwise atrophic appearing endometrial cavity with normal tubal ostia.  No evidence of uterine perforation.  At this time, MyoSure device was entered and resection down to the base  of the endometrial polyp was performed without difficulty.  Minimal bleeding was noted.  D and C was performed using sharp curettage in  a 4 quadrant method.  Good hemostasis was noted.  All instruments were removed.  Fluid deficit as noted.  The patient  tolerated the procedure well and was transferred to recovery in good condition.   PUS D: 12/08/2022 10:16:47 am T: 12/08/2022 10:26:00 am  JOB: [id number]/ [id number]

## 2022-12-08 NOTE — Anesthesia Postprocedure Evaluation (Signed)
Anesthesia Post Note  Patient: [name], MD  Procedure(s) Performed: DILATATION & CURETTAGE/HYSTEROSCOPY WITH MYOSURE (Vagina )     Patient location during evaluation: PACU Anesthesia Type: General Level of consciousness: awake and alert, oriented and patient cooperative Pain management: pain level controlled Vital Signs Assessment: post-procedure vital signs reviewed and stable Respiratory status: spontaneous breathing, nonlabored ventilation and respiratory function stable Cardiovascular status: blood pressure returned to baseline and stable Postop Assessment: no apparent nausea or vomiting Anesthetic complications: no   No notable events documented.  Last Vitals:  Vitals:   12/08/22 1030 12/08/22 1045  BP: (!) 115/57   Pulse: (!) 46 (!) 51  Resp: 18 16  Temp:    SpO2: 100% 99%    Last Pain:  Vitals:   12/08/22 1024  TempSrc:   PainSc: 0-No pain                 Pervis Hocking

## 2022-12-08 NOTE — Transfer of Care (Signed)
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  Last Pain:  Vitals:   12/08/22 0748  TempSrc: Oral  PainSc: 0-No pain      Patients Stated Pain Goal: 5 (75/64/33 2951)  Complications: No notable events documented.

## 2022-12-08 NOTE — Anesthesia Procedure Notes (Signed)
Procedure Name: LMA Insertion Date/Time: 12/08/2022 9:48 AM  Performed by: Gaynor Ferreras D, CRNAPre-anesthesia Checklist: Patient identified, Emergency Drugs available, Suction available and Patient being monitored Patient Re-evaluated:Patient Re-evaluated prior to induction Oxygen Delivery Method: Circle system utilized Preoxygenation: Pre-oxygenation with 100% oxygen Induction Type: IV induction Ventilation: Mask ventilation without difficulty LMA: LMA inserted LMA Size: 3.0 Tube type: Oral Number of attempts: 1 Placement Confirmation: positive ETCO2 and breath sounds checked- equal and bilateral Tube secured with: Tape Dental Injury: Teeth and Oropharynx as per pre-operative assessment

## 2022-12-09 ENCOUNTER — Encounter (HOSPITAL_BASED_OUTPATIENT_CLINIC_OR_DEPARTMENT_OTHER): Payer: Self-pay | Admitting: Obstetrics and Gynecology

## 2022-12-10 LAB — SURGICAL PATHOLOGY

## 2022-12-14 DIAGNOSIS — M9905 Segmental and somatic dysfunction of pelvic region: Secondary | ICD-10-CM | POA: Diagnosis not present

## 2022-12-14 DIAGNOSIS — M9904 Segmental and somatic dysfunction of sacral region: Secondary | ICD-10-CM | POA: Diagnosis not present

## 2022-12-14 DIAGNOSIS — M9902 Segmental and somatic dysfunction of thoracic region: Secondary | ICD-10-CM | POA: Diagnosis not present

## 2022-12-14 DIAGNOSIS — M79672 Pain in left foot: Secondary | ICD-10-CM | POA: Diagnosis not present

## 2022-12-14 DIAGNOSIS — M546 Pain in thoracic spine: Secondary | ICD-10-CM | POA: Diagnosis not present

## 2022-12-14 DIAGNOSIS — M9903 Segmental and somatic dysfunction of lumbar region: Secondary | ICD-10-CM | POA: Diagnosis not present

## 2022-12-14 DIAGNOSIS — M9908 Segmental and somatic dysfunction of rib cage: Secondary | ICD-10-CM | POA: Diagnosis not present

## 2022-12-14 DIAGNOSIS — M79652 Pain in left thigh: Secondary | ICD-10-CM | POA: Diagnosis not present

## 2022-12-14 DIAGNOSIS — M25552 Pain in left hip: Secondary | ICD-10-CM | POA: Diagnosis not present

## 2022-12-14 DIAGNOSIS — M25562 Pain in left knee: Secondary | ICD-10-CM | POA: Diagnosis not present

## 2022-12-14 DIAGNOSIS — M5451 Vertebrogenic low back pain: Secondary | ICD-10-CM | POA: Diagnosis not present

## 2022-12-14 DIAGNOSIS — M9906 Segmental and somatic dysfunction of lower extremity: Secondary | ICD-10-CM | POA: Diagnosis not present

## 2022-12-28 DIAGNOSIS — M9904 Segmental and somatic dysfunction of sacral region: Secondary | ICD-10-CM | POA: Diagnosis not present

## 2022-12-28 DIAGNOSIS — M542 Cervicalgia: Secondary | ICD-10-CM | POA: Diagnosis not present

## 2022-12-28 DIAGNOSIS — M79652 Pain in left thigh: Secondary | ICD-10-CM | POA: Diagnosis not present

## 2022-12-28 DIAGNOSIS — M79671 Pain in right foot: Secondary | ICD-10-CM | POA: Diagnosis not present

## 2022-12-28 DIAGNOSIS — M79672 Pain in left foot: Secondary | ICD-10-CM | POA: Diagnosis not present

## 2022-12-28 DIAGNOSIS — M9902 Segmental and somatic dysfunction of thoracic region: Secondary | ICD-10-CM | POA: Diagnosis not present

## 2022-12-28 DIAGNOSIS — M9905 Segmental and somatic dysfunction of pelvic region: Secondary | ICD-10-CM | POA: Diagnosis not present

## 2022-12-28 DIAGNOSIS — M5451 Vertebrogenic low back pain: Secondary | ICD-10-CM | POA: Diagnosis not present

## 2022-12-28 DIAGNOSIS — M9903 Segmental and somatic dysfunction of lumbar region: Secondary | ICD-10-CM | POA: Diagnosis not present

## 2022-12-28 DIAGNOSIS — M9906 Segmental and somatic dysfunction of lower extremity: Secondary | ICD-10-CM | POA: Diagnosis not present

## 2022-12-28 DIAGNOSIS — M79651 Pain in right thigh: Secondary | ICD-10-CM | POA: Diagnosis not present

## 2023-01-11 DIAGNOSIS — M9904 Segmental and somatic dysfunction of sacral region: Secondary | ICD-10-CM | POA: Diagnosis not present

## 2023-01-11 DIAGNOSIS — M9908 Segmental and somatic dysfunction of rib cage: Secondary | ICD-10-CM | POA: Diagnosis not present

## 2023-01-11 DIAGNOSIS — M9907 Segmental and somatic dysfunction of upper extremity: Secondary | ICD-10-CM | POA: Diagnosis not present

## 2023-01-11 DIAGNOSIS — M9902 Segmental and somatic dysfunction of thoracic region: Secondary | ICD-10-CM | POA: Diagnosis not present

## 2023-01-11 DIAGNOSIS — R251 Tremor, unspecified: Secondary | ICD-10-CM | POA: Diagnosis not present

## 2023-01-11 DIAGNOSIS — M9903 Segmental and somatic dysfunction of lumbar region: Secondary | ICD-10-CM | POA: Diagnosis not present

## 2023-01-11 DIAGNOSIS — M542 Cervicalgia: Secondary | ICD-10-CM | POA: Diagnosis not present

## 2023-01-11 DIAGNOSIS — M9905 Segmental and somatic dysfunction of pelvic region: Secondary | ICD-10-CM | POA: Diagnosis not present

## 2023-01-11 DIAGNOSIS — M79672 Pain in left foot: Secondary | ICD-10-CM | POA: Diagnosis not present

## 2023-01-11 DIAGNOSIS — M9901 Segmental and somatic dysfunction of cervical region: Secondary | ICD-10-CM | POA: Diagnosis not present

## 2023-01-11 DIAGNOSIS — M545 Low back pain, unspecified: Secondary | ICD-10-CM | POA: Diagnosis not present

## 2023-01-11 DIAGNOSIS — M24175 Other articular cartilage disorders, left foot: Secondary | ICD-10-CM | POA: Diagnosis not present

## 2023-01-14 DIAGNOSIS — N951 Menopausal and female climacteric states: Secondary | ICD-10-CM | POA: Diagnosis not present

## 2023-01-14 DIAGNOSIS — Z01411 Encounter for gynecological examination (general) (routine) with abnormal findings: Secondary | ICD-10-CM | POA: Diagnosis not present

## 2023-01-14 DIAGNOSIS — Z124 Encounter for screening for malignant neoplasm of cervix: Secondary | ICD-10-CM | POA: Diagnosis not present

## 2023-01-14 DIAGNOSIS — Z6820 Body mass index (BMI) 20.0-20.9, adult: Secondary | ICD-10-CM | POA: Diagnosis not present

## 2023-01-14 DIAGNOSIS — Z01419 Encounter for gynecological examination (general) (routine) without abnormal findings: Secondary | ICD-10-CM | POA: Diagnosis not present

## 2023-01-29 DIAGNOSIS — M9903 Segmental and somatic dysfunction of lumbar region: Secondary | ICD-10-CM | POA: Diagnosis not present

## 2023-01-29 DIAGNOSIS — M79672 Pain in left foot: Secondary | ICD-10-CM | POA: Diagnosis not present

## 2023-01-29 DIAGNOSIS — M9904 Segmental and somatic dysfunction of sacral region: Secondary | ICD-10-CM | POA: Diagnosis not present

## 2023-01-29 DIAGNOSIS — M9908 Segmental and somatic dysfunction of rib cage: Secondary | ICD-10-CM | POA: Diagnosis not present

## 2023-01-29 DIAGNOSIS — M79671 Pain in right foot: Secondary | ICD-10-CM | POA: Diagnosis not present

## 2023-01-29 DIAGNOSIS — M542 Cervicalgia: Secondary | ICD-10-CM | POA: Diagnosis not present

## 2023-01-29 DIAGNOSIS — M9902 Segmental and somatic dysfunction of thoracic region: Secondary | ICD-10-CM | POA: Diagnosis not present

## 2023-01-29 DIAGNOSIS — M9901 Segmental and somatic dysfunction of cervical region: Secondary | ICD-10-CM | POA: Diagnosis not present

## 2023-01-29 DIAGNOSIS — M545 Low back pain, unspecified: Secondary | ICD-10-CM | POA: Diagnosis not present

## 2023-01-29 DIAGNOSIS — M9905 Segmental and somatic dysfunction of pelvic region: Secondary | ICD-10-CM | POA: Diagnosis not present

## 2023-01-29 DIAGNOSIS — M9906 Segmental and somatic dysfunction of lower extremity: Secondary | ICD-10-CM | POA: Diagnosis not present

## 2023-02-04 DIAGNOSIS — G5781 Other specified mononeuropathies of right lower limb: Secondary | ICD-10-CM | POA: Diagnosis not present

## 2023-02-04 DIAGNOSIS — M722 Plantar fascial fibromatosis: Secondary | ICD-10-CM | POA: Diagnosis not present

## 2023-02-04 DIAGNOSIS — M205X2 Other deformities of toe(s) (acquired), left foot: Secondary | ICD-10-CM | POA: Diagnosis not present

## 2023-02-04 DIAGNOSIS — M7662 Achilles tendinitis, left leg: Secondary | ICD-10-CM | POA: Diagnosis not present

## 2023-02-04 DIAGNOSIS — M205X1 Other deformities of toe(s) (acquired), right foot: Secondary | ICD-10-CM | POA: Diagnosis not present

## 2023-02-04 DIAGNOSIS — M792 Neuralgia and neuritis, unspecified: Secondary | ICD-10-CM | POA: Diagnosis not present

## 2023-02-09 ENCOUNTER — Ambulatory Visit: Payer: PPO | Attending: Internal Medicine | Admitting: Internal Medicine

## 2023-02-09 ENCOUNTER — Encounter: Payer: Self-pay | Admitting: Internal Medicine

## 2023-02-09 VITALS — BP 118/58 | HR 85 | Ht 66.0 in | Wt 120.0 lb

## 2023-02-09 DIAGNOSIS — I491 Atrial premature depolarization: Secondary | ICD-10-CM

## 2023-02-09 DIAGNOSIS — G5761 Lesion of plantar nerve, right lower limb: Secondary | ICD-10-CM | POA: Diagnosis not present

## 2023-02-09 DIAGNOSIS — I4719 Other supraventricular tachycardia: Secondary | ICD-10-CM | POA: Diagnosis not present

## 2023-02-09 NOTE — Progress Notes (Signed)
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  Event recorder demonstrated rare PVCs, frequent but not quantitated PACs and nonsustained runs of atrial tachycardia  She brings in her cholesterol data which gives her 10-year risk of 6.8% not withstanding her LDL of 160 or so.  2 interval neurological events dizziness Right arm weakness associate with a long hike  DATE TEST EF    4/19 Echo   60-65 %     9/21 Myoview     No perfusion defects    Date Cr K Hgb   5/20 0.83 4.3               History of bilateral mastectomy for breast cancer   Records and Results Reviewed   Past Medical History:  Diagnosis Date   Anxiety    Arthritis    Family history of breast cancer    Family history of melanoma    Family history of pancreatic cancer    History of basal cell carcinoma ([facility]) excision 2014   left mid back   History of fungal infection 2018   systemic fungal infection due to black mold exposure   Hyperlipidemia    Hypothyroidism    followed by pcp   Irregular heartbeat    per pt asymptomatic ;   last cardiology visit w/ dr Caryl Comes 07-03-2020 for abnormal EKG, Atrial ectopy/ PACs w/ NS atrial tachycardia; work-up results in epic,  event monitor 04/ 2019 showed SR with PACs,  normal echo 04/ 2019, nuclear stress test showed normal no ischemia normal LVF nuclear ef 70%   Malignant neoplasm of upper-outer quadrant of left breast in female, 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 3 months ago,  had epi injeciton approx one month ago, followed by dr [name],  no rom restriction but do not hyperextend neck   Wears contact lenses     Past Surgical History:  Procedure Laterality Date   [place] N/A 12/08/2022   Procedure: [place];  Surgeon: [name], MD;  Location: [place];  Service: Gynecology;  Laterality: N/A;   MASTECTOMY W/ SENTINEL NODE BIOPSY Bilateral 09/06/2017   Procedure: LEFT MASTECTOMY WITH LEFT SENTINEL LYMPH NODE BIOPSY AND  RIGHT PROPHYLACTIC MASTECTOMY;  Surgeon: [name], MD;  Location: [place];  Service: General;  Laterality: Bilateral;   OOPHORECTOMY Bilateral    ROBOTIC ASSISTED SALPINGO OOPHERECTOMY Bilateral 05/20/2021   Procedure: XI ROBOTIC ASSISTED SALPINGO OOPHORECTOMY;  Surgeon: [name], MD;  Location: [place];  Service: Gynecology;  Laterality: Bilateral;   TONSILLECTOMY AND ADENOIDECTOMY     child    Current Meds  Medication Sig   Emollient (DHEA EX) Apply 8 mg topically as needed (2 cc daily). vaginal   levothyroxine (SYNTHROID) 75 MCG tablet Take 75  mcg by mouth every morning.   liothyronine (CYTOMEL) 25 MCG tablet Take 12.5 mcg by mouth daily.   Multiple Vitamins-Minerals (MULTIVITAMIN WITH MINERALS) tablet Take 1 tablet by mouth daily.   progesterone (PROMETRIUM) 100 MG capsule Take by mouth daily. Take 200 mg in the morning and 400 mg in the evening.  TROCHE   traZODone (DESYREL) 100 MG tablet Take 1 tablet (100 mg total) by mouth at bedtime.    Allergies  Allergen Reactions   Sulfa Antibiotics Rash      Review of Systems negative except from HPI and PMH  Physical Exam BP (!) 118/58   Pulse 85   Ht 5\' 6"  (1.676 m)   Wt 120 lb (54.4 kg)   BMI 19.37 kg/m  Well  developed and nourished in no acute distress HENT normal Neck supple with JVP-  flat  Clear Regular rate and rhythm, no murmurs or gallops Abd-soft with active BS No Clubbing cyanosis edema Skin-warm and dry A & Oriented  Grossly normal sensory and motor function  ECG   sinus at 85 with evidence of right atrial enlargement Q waves lead V1 and V2 and recurring short runs of nonsustained atrial tachycardia we have records and fellow  CrCl cannot be calculated (Patient's most recent lab result is older than the maximum 21 days allowed.).   Assessment and  Plan  Abnormal ECG  Hypercholesterolemia   Atrial ectopy-PACs and nonsustained atrial tachycardia   Breast cancer status postmastectomy   Black mold exposure  Continues with infrequent atrial ectopy.  No sustained issues.  She has had 2 transient neurological events, 1 of which was associated with protracted dizziness and for which she underwent evaluation at [facility] with MRI and other testing which was negative.  The other 1 occurred more recently on a hike where she ran out of water and had right arm weakness that resolved upon sitting down and drinking fluid.     Current medicines are reviewed at length with the patient today .  The patient does not  have concerns regarding medicines.

## 2023-02-09 NOTE — Patient Instructions (Signed)
Medication Instructions:  Your physician recommends that you continue on your current medications as directed. Please refer to the Current Medication list given to you today.  *If you need a refill on your cardiac medications before your next appointment, please call your pharmacy*   Lab Work: None ordered.  If you have labs (blood work) drawn today and your tests are completely normal, you will receive your results only by: MyChart Message (if you have MyChart) OR A paper copy in the mail If you have any lab test that is abnormal or we need to change your treatment, we will call you to review the results.   Testing/Procedures: None ordered.    Follow-Up: At [facility], you and your health needs are our priority.  As part of our continuing mission to provide you with exceptional heart care, we have created designated Provider Care Teams.  These Care Teams include your primary Cardiologist (physician) and Advanced Practice Providers (APPs -  Physician Assistants and Nurse Practitioners) who all work together to provide you with the care you need, when you need it.  We recommend signing up for the patient portal called "MyChart".  Sign up information is provided on this After Visit Summary.  MyChart is used to connect with patients for Virtual Visits (Telemedicine).  Patients are able to view lab/test results, encounter notes, upcoming appointments, etc.  Non-urgent messages can be sent to your provider as well.   To learn more about what you can do with MyChart, go to [email or web address].    Your next appointment:   12 months with Dr Klein 

## 2023-02-11 DIAGNOSIS — M9908 Segmental and somatic dysfunction of rib cage: Secondary | ICD-10-CM | POA: Diagnosis not present

## 2023-02-11 DIAGNOSIS — M9901 Segmental and somatic dysfunction of cervical region: Secondary | ICD-10-CM | POA: Diagnosis not present

## 2023-02-11 DIAGNOSIS — M9907 Segmental and somatic dysfunction of upper extremity: Secondary | ICD-10-CM | POA: Diagnosis not present

## 2023-02-11 DIAGNOSIS — M542 Cervicalgia: Secondary | ICD-10-CM | POA: Diagnosis not present

## 2023-02-11 DIAGNOSIS — G5762 Lesion of plantar nerve, left lower limb: Secondary | ICD-10-CM | POA: Diagnosis not present

## 2023-02-11 DIAGNOSIS — G5761 Lesion of plantar nerve, right lower limb: Secondary | ICD-10-CM | POA: Diagnosis not present

## 2023-02-11 DIAGNOSIS — M9906 Segmental and somatic dysfunction of lower extremity: Secondary | ICD-10-CM | POA: Diagnosis not present

## 2023-02-11 DIAGNOSIS — M545 Low back pain, unspecified: Secondary | ICD-10-CM | POA: Diagnosis not present

## 2023-02-11 DIAGNOSIS — M9902 Segmental and somatic dysfunction of thoracic region: Secondary | ICD-10-CM | POA: Diagnosis not present

## 2023-02-11 DIAGNOSIS — M25551 Pain in right hip: Secondary | ICD-10-CM | POA: Diagnosis not present

## 2023-02-11 DIAGNOSIS — M9903 Segmental and somatic dysfunction of lumbar region: Secondary | ICD-10-CM | POA: Diagnosis not present

## 2023-02-11 DIAGNOSIS — M9905 Segmental and somatic dysfunction of pelvic region: Secondary | ICD-10-CM | POA: Diagnosis not present

## 2023-02-22 DIAGNOSIS — M545 Low back pain, unspecified: Secondary | ICD-10-CM | POA: Diagnosis not present

## 2023-02-22 DIAGNOSIS — M9906 Segmental and somatic dysfunction of lower extremity: Secondary | ICD-10-CM | POA: Diagnosis not present

## 2023-02-22 DIAGNOSIS — R531 Weakness: Secondary | ICD-10-CM | POA: Diagnosis not present

## 2023-02-22 DIAGNOSIS — G5761 Lesion of plantar nerve, right lower limb: Secondary | ICD-10-CM | POA: Diagnosis not present

## 2023-02-22 DIAGNOSIS — M79671 Pain in right foot: Secondary | ICD-10-CM | POA: Diagnosis not present

## 2023-02-22 DIAGNOSIS — M79672 Pain in left foot: Secondary | ICD-10-CM | POA: Diagnosis not present

## 2023-02-22 DIAGNOSIS — M9905 Segmental and somatic dysfunction of pelvic region: Secondary | ICD-10-CM | POA: Diagnosis not present

## 2023-02-22 DIAGNOSIS — G5762 Lesion of plantar nerve, left lower limb: Secondary | ICD-10-CM | POA: Diagnosis not present

## 2023-02-22 DIAGNOSIS — M9903 Segmental and somatic dysfunction of lumbar region: Secondary | ICD-10-CM | POA: Diagnosis not present

## 2023-02-22 DIAGNOSIS — M9904 Segmental and somatic dysfunction of sacral region: Secondary | ICD-10-CM | POA: Diagnosis not present

## 2023-03-08 DIAGNOSIS — M7662 Achilles tendinitis, left leg: Secondary | ICD-10-CM | POA: Diagnosis not present

## 2023-03-08 DIAGNOSIS — G5761 Lesion of plantar nerve, right lower limb: Secondary | ICD-10-CM | POA: Diagnosis not present

## 2023-03-08 DIAGNOSIS — M722 Plantar fascial fibromatosis: Secondary | ICD-10-CM | POA: Diagnosis not present

## 2023-03-08 DIAGNOSIS — M25572 Pain in left ankle and joints of left foot: Secondary | ICD-10-CM | POA: Diagnosis not present

## 2023-04-01 ENCOUNTER — Other Ambulatory Visit: Payer: Self-pay | Admitting: Sports Medicine

## 2023-04-01 DIAGNOSIS — M9906 Segmental and somatic dysfunction of lower extremity: Secondary | ICD-10-CM | POA: Diagnosis not present

## 2023-04-01 DIAGNOSIS — M4722 Other spondylosis with radiculopathy, cervical region: Secondary | ICD-10-CM | POA: Diagnosis not present

## 2023-04-01 DIAGNOSIS — M9903 Segmental and somatic dysfunction of lumbar region: Secondary | ICD-10-CM | POA: Diagnosis not present

## 2023-04-01 DIAGNOSIS — M5451 Vertebrogenic low back pain: Secondary | ICD-10-CM | POA: Diagnosis not present

## 2023-04-01 DIAGNOSIS — M546 Pain in thoracic spine: Secondary | ICD-10-CM | POA: Diagnosis not present

## 2023-04-01 DIAGNOSIS — M25511 Pain in right shoulder: Secondary | ICD-10-CM | POA: Diagnosis not present

## 2023-04-01 DIAGNOSIS — M9904 Segmental and somatic dysfunction of sacral region: Secondary | ICD-10-CM | POA: Diagnosis not present

## 2023-04-01 DIAGNOSIS — M25571 Pain in right ankle and joints of right foot: Secondary | ICD-10-CM

## 2023-04-01 DIAGNOSIS — M1711 Unilateral primary osteoarthritis, right knee: Secondary | ICD-10-CM

## 2023-04-01 DIAGNOSIS — M9908 Segmental and somatic dysfunction of rib cage: Secondary | ICD-10-CM | POA: Diagnosis not present

## 2023-04-01 DIAGNOSIS — G8929 Other chronic pain: Secondary | ICD-10-CM

## 2023-04-01 DIAGNOSIS — M9902 Segmental and somatic dysfunction of thoracic region: Secondary | ICD-10-CM | POA: Diagnosis not present

## 2023-04-01 DIAGNOSIS — M9901 Segmental and somatic dysfunction of cervical region: Secondary | ICD-10-CM | POA: Diagnosis not present

## 2023-04-01 DIAGNOSIS — M542 Cervicalgia: Secondary | ICD-10-CM | POA: Diagnosis not present

## 2023-04-01 DIAGNOSIS — M9905 Segmental and somatic dysfunction of pelvic region: Secondary | ICD-10-CM | POA: Diagnosis not present

## 2023-05-03 DIAGNOSIS — E039 Hypothyroidism, unspecified: Secondary | ICD-10-CM | POA: Diagnosis not present

## 2023-05-03 DIAGNOSIS — R5383 Other fatigue: Secondary | ICD-10-CM | POA: Diagnosis not present

## 2023-05-03 DIAGNOSIS — E7800 Pure hypercholesterolemia, unspecified: Secondary | ICD-10-CM | POA: Diagnosis not present

## 2023-05-03 DIAGNOSIS — E559 Vitamin D deficiency, unspecified: Secondary | ICD-10-CM | POA: Diagnosis not present

## 2023-05-03 DIAGNOSIS — D809 Immunodeficiency with predominantly antibody defects, unspecified: Secondary | ICD-10-CM | POA: Diagnosis not present

## 2023-05-03 DIAGNOSIS — R790 Abnormal level of blood mineral: Secondary | ICD-10-CM | POA: Diagnosis not present

## 2023-06-15 DIAGNOSIS — M7662 Achilles tendinitis, left leg: Secondary | ICD-10-CM | POA: Diagnosis not present

## 2023-06-15 DIAGNOSIS — M722 Plantar fascial fibromatosis: Secondary | ICD-10-CM | POA: Diagnosis not present

## 2023-06-15 DIAGNOSIS — G5761 Lesion of plantar nerve, right lower limb: Secondary | ICD-10-CM | POA: Diagnosis not present

## 2023-07-30 DIAGNOSIS — C44712 Basal cell carcinoma of skin of right lower limb, including hip: Secondary | ICD-10-CM | POA: Diagnosis not present

## 2023-07-30 DIAGNOSIS — L578 Other skin changes due to chronic exposure to nonionizing radiation: Secondary | ICD-10-CM | POA: Diagnosis not present

## 2023-07-30 DIAGNOSIS — D225 Melanocytic nevi of trunk: Secondary | ICD-10-CM | POA: Diagnosis not present

## 2023-07-30 DIAGNOSIS — L821 Other seborrheic keratosis: Secondary | ICD-10-CM | POA: Diagnosis not present

## 2023-07-30 DIAGNOSIS — Z808 Family history of malignant neoplasm of other organs or systems: Secondary | ICD-10-CM | POA: Diagnosis not present

## 2023-07-30 DIAGNOSIS — C4491 Basal cell carcinoma of skin, unspecified: Secondary | ICD-10-CM | POA: Diagnosis not present

## 2023-07-30 DIAGNOSIS — Z85828 Personal history of other malignant neoplasm of skin: Secondary | ICD-10-CM | POA: Diagnosis not present

## 2023-07-30 DIAGNOSIS — D239 Other benign neoplasm of skin, unspecified: Secondary | ICD-10-CM | POA: Diagnosis not present

## 2023-07-30 DIAGNOSIS — D485 Neoplasm of uncertain behavior of skin: Secondary | ICD-10-CM | POA: Diagnosis not present

## 2023-08-04 DIAGNOSIS — M9908 Segmental and somatic dysfunction of rib cage: Secondary | ICD-10-CM | POA: Diagnosis not present

## 2023-08-04 DIAGNOSIS — M25512 Pain in left shoulder: Secondary | ICD-10-CM | POA: Diagnosis not present

## 2023-08-04 DIAGNOSIS — M9902 Segmental and somatic dysfunction of thoracic region: Secondary | ICD-10-CM | POA: Diagnosis not present

## 2023-08-04 DIAGNOSIS — M9907 Segmental and somatic dysfunction of upper extremity: Secondary | ICD-10-CM | POA: Diagnosis not present

## 2023-08-04 DIAGNOSIS — Z853 Personal history of malignant neoplasm of breast: Secondary | ICD-10-CM | POA: Diagnosis not present

## 2023-08-04 DIAGNOSIS — M542 Cervicalgia: Secondary | ICD-10-CM | POA: Diagnosis not present

## 2023-08-04 DIAGNOSIS — R635 Abnormal weight gain: Secondary | ICD-10-CM | POA: Diagnosis not present

## 2023-08-04 DIAGNOSIS — M9901 Segmental and somatic dysfunction of cervical region: Secondary | ICD-10-CM | POA: Diagnosis not present

## 2023-08-15 DIAGNOSIS — J069 Acute upper respiratory infection, unspecified: Secondary | ICD-10-CM | POA: Diagnosis not present

## 2023-08-15 DIAGNOSIS — R059 Cough, unspecified: Secondary | ICD-10-CM | POA: Diagnosis not present

## 2023-08-15 DIAGNOSIS — H9201 Otalgia, right ear: Secondary | ICD-10-CM | POA: Diagnosis not present

## 2023-08-15 DIAGNOSIS — Z20822 Contact with and (suspected) exposure to covid-19: Secondary | ICD-10-CM | POA: Diagnosis not present

## 2023-09-02 DIAGNOSIS — D0512 Intraductal carcinoma in situ of left breast: Secondary | ICD-10-CM | POA: Diagnosis not present

## 2023-09-06 DIAGNOSIS — C44712 Basal cell carcinoma of skin of right lower limb, including hip: Secondary | ICD-10-CM | POA: Diagnosis not present

## 2023-09-09 DIAGNOSIS — Z9011 Acquired absence of right breast and nipple: Secondary | ICD-10-CM | POA: Diagnosis not present

## 2023-09-09 DIAGNOSIS — C50912 Malignant neoplasm of unspecified site of left female breast: Secondary | ICD-10-CM | POA: Diagnosis not present

## 2023-09-13 DIAGNOSIS — M9902 Segmental and somatic dysfunction of thoracic region: Secondary | ICD-10-CM | POA: Diagnosis not present

## 2023-09-13 DIAGNOSIS — M9908 Segmental and somatic dysfunction of rib cage: Secondary | ICD-10-CM | POA: Diagnosis not present

## 2023-09-13 DIAGNOSIS — M25512 Pain in left shoulder: Secondary | ICD-10-CM | POA: Diagnosis not present

## 2023-09-13 DIAGNOSIS — M25561 Pain in right knee: Secondary | ICD-10-CM | POA: Diagnosis not present

## 2023-09-13 DIAGNOSIS — M9907 Segmental and somatic dysfunction of upper extremity: Secondary | ICD-10-CM | POA: Diagnosis not present

## 2023-09-13 DIAGNOSIS — M9906 Segmental and somatic dysfunction of lower extremity: Secondary | ICD-10-CM | POA: Diagnosis not present

## 2023-09-13 DIAGNOSIS — M9903 Segmental and somatic dysfunction of lumbar region: Secondary | ICD-10-CM | POA: Diagnosis not present

## 2023-09-13 DIAGNOSIS — M542 Cervicalgia: Secondary | ICD-10-CM | POA: Diagnosis not present

## 2023-09-13 DIAGNOSIS — M9901 Segmental and somatic dysfunction of cervical region: Secondary | ICD-10-CM | POA: Diagnosis not present

## 2023-09-13 DIAGNOSIS — M9904 Segmental and somatic dysfunction of sacral region: Secondary | ICD-10-CM | POA: Diagnosis not present

## 2023-09-13 DIAGNOSIS — M9905 Segmental and somatic dysfunction of pelvic region: Secondary | ICD-10-CM | POA: Diagnosis not present

## 2023-09-16 ENCOUNTER — Other Ambulatory Visit: Payer: Self-pay | Admitting: General Surgery

## 2023-09-16 ENCOUNTER — Ambulatory Visit: Payer: PPO

## 2023-09-16 ENCOUNTER — Ambulatory Visit
Admission: RE | Admit: 2023-09-16 | Discharge: 2023-09-16 | Disposition: A | Discharge status: Home / Self Care | Payer: PPO | Source: Ambulatory Visit | Attending: Sports Medicine | Admitting: Sports Medicine

## 2023-09-16 DIAGNOSIS — M25561 Pain in right knee: Secondary | ICD-10-CM | POA: Diagnosis not present

## 2023-09-16 DIAGNOSIS — C50912 Malignant neoplasm of unspecified site of left female breast: Secondary | ICD-10-CM | POA: Diagnosis not present

## 2023-09-16 DIAGNOSIS — D0512 Intraductal carcinoma in situ of left breast: Secondary | ICD-10-CM

## 2023-09-16 DIAGNOSIS — M1711 Unilateral primary osteoarthritis, right knee: Secondary | ICD-10-CM

## 2023-09-16 DIAGNOSIS — M25562 Pain in left knee: Secondary | ICD-10-CM | POA: Diagnosis not present

## 2023-09-16 DIAGNOSIS — G8929 Other chronic pain: Secondary | ICD-10-CM

## 2023-09-23 NOTE — Therapy (Signed)
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 DOB:January 16, 1951, 72 y.o., female Today's Date: 09/24/2023  END OF SESSION:  PT End of Session - 09/24/23 1318     Visit Number 1    Number of Visits 8    Date for PT Re-Evaluation 10/22/23    PT Start Time 0903    PT Stop Time 0955    PT Time Calculation (min) 52 min    Activity Tolerance Patient tolerated treatment well    Behavior During Therapy [facility] for tasks assessed/performed             Past Medical History:  Diagnosis Date   Anxiety    Arthritis    Family history of breast cancer    Family history of melanoma    Family history of pancreatic cancer    History of basal cell carcinoma (BCC) excision 2014   left mid back   History of fungal infection 2018   systemic fungal infection due to black mold exposure   Hyperlipidemia    Hypothyroidism    followed by pcp   Irregular heartbeat    per pt asymptomatic ;   last cardiology visit w/ dr Graciela Husbands 07-03-2020 for abnormal EKG, Atrial ectopy/ PACs w/ NS atrial tachycardia; work-up results in epic,  event monitor 04/ 2019 showed SR with PACs,  normal echo 04/ 2019, nuclear stress test showed normal no ischemia normal LVF nuclear ef 70%   Malignant neoplasm of upper-outer quadrant of left breast in female, 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 3 months ago,  had epi injeciton approx one month ago, followed by dr Berline Chough,  no rom restriction but do not hyperextend neck   Wears contact lenses    Past Surgical History:  Procedure Laterality Date   APPENDECTOMY  1983   CHOLECYSTECTOMY OPEN  1975   DILATATION & CURETTAGE/HYSTEROSCOPY WITH MYOSURE N/A 12/08/2022   Procedure: DILATATION &  CURETTAGE/HYSTEROSCOPY WITH MYOSURE;  Surgeon: Olivia Mackie, MD;  Location: [facility];  Service: Gynecology;  Laterality: N/A;   MASTECTOMY W/ SENTINEL NODE BIOPSY Bilateral 09/06/2017   Procedure: LEFT MASTECTOMY WITH LEFT SENTINEL LYMPH NODE BIOPSY AND  RIGHT PROPHYLACTIC MASTECTOMY;  Surgeon: Griselda Miner, MD;  Location: [facility];  Service: General;  Laterality: Bilateral;   OOPHORECTOMY Bilateral    ROBOTIC ASSISTED SALPINGO OOPHERECTOMY Bilateral 05/20/2021   Procedure: XI ROBOTIC ASSISTED SALPINGO OOPHORECTOMY;  Surgeon: Olivia Mackie, MD;  Location: [facility];  Service: Gynecology;  Laterality: Bilateral;   TONSILLECTOMY AND ADENOIDECTOMY     child   Patient Active Problem List   Diagnosis Date Noted   Changing skin lesion 11/11/2021   Basal cell carcinoma 08/12/2021   Atrial tachycardia (HCC) - non sustained 08/06/2021   PAC (premature atrial contraction) 08/06/2021   Preop cardiovascular exam 11/14/2019   Hyperlipidemia with target LDL less than 130 11/13/2019   Family history of breast cancer    Family history of melanoma    Family history of pancreatic cancer    Patellofemoral arthritis of right knee 02/28/2018   Palpitations 02/10/2018   Abnormal finding on EKG 02/10/2018   Breast cancer in situ 09/06/2017   Ductal carcinoma in situ (DCIS) of left  breast 08/19/2017   Malignant neoplasm of upper-outer quadrant of left breast in female, estrogen receptor positive (HCC) 08/17/2017   Low back pain 12/02/2016   Left hamstring injury 11/01/2013   Nonallopathic lesion of cervical region 11/01/2013   Nonallopathic lesion of lumbosacral region 11/01/2013   Nonallopathic lesion of thoracic region 11/01/2013    PCP:   REFERRING PROVIDER:Paul Anders Grant DIAG:s/p Mastectomy 2018, Left shoulder pain  THERAPY DIAG:  Shoulder pain, unspecified chronicity, unspecified laterality  Abnormal posture  Ductal carcinoma in situ of left  breast  ONSET DATE: Spring 2024  Rationale for Evaluation and Treatment: Rehabilitation  SUBJECTIVE:                                                                                                                                                                                           SUBJECTIVE STATEMENT:  This past spring pt. started experiencing tightness in the Left axilla around the time of a 120 mile hike in [place]. She was carrying a 10 lb back pack daily. After  she noticed it and stretched a bit and it didn't improve she went to see Dr Carolynne Edouard. He did not have concerns of a cancer reoccurence.  She has found that hanging from her hands on the assisted body wt machine and wt. Training seems to help.  She has some pain in her left shoulder, and a lot of arthritis in her lower neck. She does get symptoms in her right UE from her neck..  She has had covid 3 times most recently 6 weeks ago.. 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 She has some atrophy in her right arm from pinched nerve in her neck. Reaching overhead can be painful.  She has a massage therapist and has seen Dr. Berline Chough an osteopath  PERTINENT HISTORY:  Pt with bilateral mastectomy on 09/06/2017 with 2 nodes removed on left side due to DCIS She did not have to have chemo , radiation or hormone treatment , mild shoulder issues with left shoulder, but has history of shoulder issues on her right shoulder,   PAIN:  Are you having pain? No, and when I have it is usually mild  PRECAUTIONS: Left UE lymphedema risk, Cervical DDD with right UE radiculopathy, weakness  RED FLAGS: None   WEIGHT BEARING RESTRICTIONS: No  FALLS:  Has patient fallen in last 6 months? Yes. Number of falls fell while hiking in [place] a number of times  LIVING ENVIRONMENT: Lives with: lives with their spouse Lives in: House/apartment Stairs: Yes; Internal: 18 steps; on right going up and External: 6  steps; on right  going up Has following equipment at home:   OCCUPATION: MD ,virtual medicine, Integrative Medicine part time  LEISURE: Travel, hiking, [facility] [employer], biking, kayaking  HAND DOMINANCE: right   PRIOR LEVEL OF FUNCTION: Independent  PATIENT GOALS: Decrease anxiety, improve ROM and decrease tightness   OBJECTIVE: Note: Objective measures were completed at Evaluation unless otherwise noted.  COGNITION: Overall cognitive status: Within functional limits for tasks assessed   PALPATION: Tightness noted left pectorals axillary border and bilateral UT, no significant tenderness  OBSERVATIONS / OTHER ASSESSMENTS: Right shoulder low,Bilateral Mastectomy incisions well healed  SENSATION: Light touch: Deficits      POSTURE: forward head, rounded shoulders  UPPER EXTREMITY AROM/PROM:  A/PROM RIGHT   eval   Shoulder extension 47  Shoulder flexion 155  Shoulder abduction 165 crepitus in shoulder  Shoulder internal rotation 55 muscles sore  Shoulder external rotation 106    (Blank rows = not tested)  A/PROM LEFT   eval  Shoulder extension 55  Shoulder flexion 155  Shoulder abduction 157  Shoulder internal rotation 65  Shoulder external rotation 96 crepitus in neck    (Blank rows = not tested)  CERVICAL AROM: Bilateral Rotation and SB decreased 50% crepitus , FB WNL, BB not tested  EMPTY CAN: Negative and with good strength LEFT IMPINGEMENT: Negative   UPPER EXTREMITY STRENGTH: LEFT; 4+/5 throughout, No pain  LYMPHEDEMA ASSESSMENTS:   SURGERY TYPE/DATE: 09/06/2017, bilateral Mastectomies  NUMBER OF LYMPH NODES REMOVED: 0/2  CHEMOTHERAPY: NO  RADIATION:NO  HORMONE TREATMENT: NO  INFECTIONS: NO   LYMPHEDEMA ASSESSMENTS:   LANDMARK RIGHT  eval  At axilla    15 cm proximal to olecranon process   10 cm proximal to olecranon process   Olecranon process   15 cm proximal to ulnar styloid process   10 cm proximal to ulnar styloid process   Just  proximal to ulnar styloid process   Across hand at thumb web space   At base of 2nd digit   (Blank rows = not tested)  LANDMARK LEFT  eval  At axilla    15 cm proximal to olecranon process   10 cm proximal to olecranon process   Olecranon process   15 cm proximal to ulnar styloid process   10 cm proximal to ulnar styloid process   Just proximal to ulnar styloid process   Across hand at thumb web space   At base of 2nd digit   (Blank rows = not tested)   FUNCTIONAL TESTS:    GAIT:WNL   QUICK DASH SURVEY: 9%   TODAY'S TREATMENT:                                                                                                                                          DATE:  09/24/2023 Pt was educated in supine stargazer stretch, pectoral corner stretch and single arm (  LEFT) pectoral stretch in doorway. She was advised to perform 2x/s daily and not to stretch to the point of pain. Use caution with Right UE and if corner stretch is painful may do single arm stretch in doorway.    PATIENT EDUCATION:  Education details: Pt was educated in supine stargazer stretch, pectoral corner stretch and single arm (LEFT) pectoral stretch in doorway. She was advised to perform 2x/s daily and not to stretch to the point of pain. Use caution with Right UE and if corner stretch is painful may do single arm stretch in doorway. Person educated: Patient Education method: Explanation, Demonstration, and Handouts Education comprehension: verbalized understanding and returned demonstration  HOME EXERCISE PROGRAM: Pectoral corner stretch, supine stargazer, single arm pec stretch  ASSESSMENT:  CLINICAL IMPRESSION: Patient is a 72 y.o. female who was seen today for physical therapy evaluation and treatment for complaints of left axillary tightness and intermittent Left shoulder pain. Pt is s/p Bilateral Mastectomies for Left DCIS in 2018. In the spring of 2024 she started noticing tightness in the left  Axillary region, and some intermittent pain in the left shoulder particularly with reaching. It may have started after her 120 mile hike in [place] where she was carrying a 10 lb pack on a daily basis. She has negative impingement and empty can test for the left shoulder. Left shoulder strength is 4+/5 throughout. Tightness is noted in bilateral UT and Left pectorals greater than right. She has some chronic cervical DDD.She will benefit from skilled PT to address deficits noted and to allow her to resume her usual active lifestyle.   OBJECTIVE IMPAIRMENTS: decreased knowledge of condition, decreased ROM, impaired UE functional use, postural dysfunction, and pain.   ACTIVITY LIMITATIONS: sleeping and reach over head  PARTICIPATION LIMITATIONS:  doing all but with tightness and mild pain with certain activities  PERSONAL FACTORS: 1-2 comorbidities: bilateral Mastectomies for left breast Cancer, Cervical DDD, shoulder pain  are also affecting patient's functional outcome.   REHAB POTENTIAL: Excellent  CLINICAL DECISION MAKING: Stable/uncomplicated  EVALUATION COMPLEXITY: Low  GOALS: Goals reviewed with patient? Yes  SHORT TERM GOALS=LONG TERM GOAL: Target date: 10/22/2023  Pt will be independent in a HEP for shoulder ROM and strength Baseline: Goal status: INITIAL  2.  Pt will have decreased LEFT shoulder pain by 50% Baseline:  Goal status: INITIAL  3. Pt will have decreased chest/axillary tightness by 50% or greater Baseline:  Goal status: INITIAL  4.  Pt will be educated in [facility] strength handout and will be able to perform with good technique Baseline:  Goal status: INITIAL   PLAN:  PT FREQUENCY: 2x/week  PT DURATION: 4 weeks  PLANNED INTERVENTIONS: 97164- PT Re-evaluation, 97110-Therapeutic exercises, 97112- Neuromuscular re-education, 97535- Self Care, 44034- Manual therapy, Patient/Family education, Joint mobilization, Therapeutic exercises, Therapeutic activity,  Neuromuscular re-education, and Self Care  PLAN FOR NEXT SESSION: STM bilateral UT/left pectorals especially axillary border, review chest stretches for HEP, postural TB, progress to ABC strength handout (Be mindful of cervical DDD and right greater than left shoulder pain.)  Waynette Buttery, PT 09/24/2023, 1:19 PM

## 2023-09-24 ENCOUNTER — Ambulatory Visit: Payer: PPO | Attending: General Surgery

## 2023-09-24 DIAGNOSIS — M25512 Pain in left shoulder: Secondary | ICD-10-CM | POA: Insufficient documentation

## 2023-09-24 DIAGNOSIS — R293 Abnormal posture: Secondary | ICD-10-CM | POA: Diagnosis not present

## 2023-09-24 DIAGNOSIS — M25519 Pain in unspecified shoulder: Secondary | ICD-10-CM

## 2023-09-24 DIAGNOSIS — D0512 Intraductal carcinoma in situ of left breast: Secondary | ICD-10-CM | POA: Diagnosis not present

## 2023-09-30 DIAGNOSIS — Z9011 Acquired absence of right breast and nipple: Secondary | ICD-10-CM | POA: Diagnosis not present

## 2023-09-30 DIAGNOSIS — C50912 Malignant neoplasm of unspecified site of left female breast: Secondary | ICD-10-CM | POA: Diagnosis not present

## 2023-10-04 ENCOUNTER — Encounter: Payer: Self-pay | Admitting: Physical Therapy

## 2023-10-04 ENCOUNTER — Ambulatory Visit: Payer: PPO | Admitting: Physical Therapy

## 2023-10-04 DIAGNOSIS — D0512 Intraductal carcinoma in situ of left breast: Secondary | ICD-10-CM | POA: Diagnosis not present

## 2023-10-04 DIAGNOSIS — R293 Abnormal posture: Secondary | ICD-10-CM

## 2023-10-04 DIAGNOSIS — M25519 Pain in unspecified shoulder: Secondary | ICD-10-CM

## 2023-10-04 NOTE — Therapy (Signed)
OUTPATIENT PHYSICAL THERAPY  UPPER EXTREMITY ONCOLOGY TREATMENT  Patient Name: Whitney Foulks, MD MRN: [id number] DOB:1950-11-25, 72 y.o., female Today's Date: 10/04/2023  END OF SESSION:  PT End of Session - 10/04/23 1302     Visit Number 2    Number of Visits 8    Date for PT Re-Evaluation 10/22/23    PT Start Time 0807    PT Stop Time 0850    PT Time Calculation (min) 43 min    Activity Tolerance Patient tolerated treatment well    Behavior During Therapy [facility] for tasks assessed/performed              Past Medical History:  Diagnosis Date   Anxiety    Arthritis    Family history of breast cancer    Family history of melanoma    Family history of pancreatic cancer    History of basal cell carcinoma (BCC) excision 2014   left mid back   History of fungal infection 2018   systemic fungal infection due to black mold exposure   Hyperlipidemia    Hypothyroidism    followed by pcp   Irregular heartbeat    per pt asymptomatic ;   last cardiology visit w/ dr Graciela Husbands 07-03-2020 for abnormal EKG, Atrial ectopy/ PACs w/ NS atrial tachycardia; work-up results in epic,  event monitor 04/ 2019 showed SR with PACs,  normal echo 04/ 2019, nuclear stress test showed normal no ischemia normal LVF nuclear ef 70%   Malignant neoplasm of upper-outer quadrant of left breast in female, 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 3 months ago,  had epi injeciton approx one month ago, followed by dr Berline Chough,  no rom restriction but do not hyperextend neck   Wears contact lenses    Past Surgical History:  Procedure Laterality Date   APPENDECTOMY  1983   CHOLECYSTECTOMY OPEN  1975   DILATATION & CURETTAGE/HYSTEROSCOPY WITH MYOSURE N/A 12/08/2022   Procedure: DILATATION &  CURETTAGE/HYSTEROSCOPY WITH MYOSURE;  Surgeon: Olivia Mackie, MD;  Location: [facility];  Service: Gynecology;  Laterality: N/A;   MASTECTOMY W/ SENTINEL NODE BIOPSY Bilateral 09/06/2017   Procedure: LEFT MASTECTOMY WITH LEFT SENTINEL LYMPH NODE BIOPSY AND  RIGHT PROPHYLACTIC MASTECTOMY;  Surgeon: Griselda Miner, MD;  Location: [facility];  Service: General;  Laterality: Bilateral;   OOPHORECTOMY Bilateral    ROBOTIC ASSISTED SALPINGO OOPHERECTOMY Bilateral 05/20/2021   Procedure: XI ROBOTIC ASSISTED SALPINGO OOPHORECTOMY;  Surgeon: Olivia Mackie, MD;  Location: [facility];  Service: Gynecology;  Laterality: Bilateral;   TONSILLECTOMY AND ADENOIDECTOMY     child   Patient Active Problem List   Diagnosis Date Noted   Changing skin lesion 11/11/2021   Basal cell carcinoma 08/12/2021   Atrial tachycardia (HCC) - non sustained 08/06/2021   PAC (premature atrial contraction) 08/06/2021   Preop cardiovascular exam 11/14/2019   Hyperlipidemia with target LDL less than 130 11/13/2019   Family history of breast cancer    Family history of melanoma    Family history of pancreatic cancer    Patellofemoral arthritis of right knee 02/28/2018   Palpitations 02/10/2018   Abnormal finding on EKG 02/10/2018   Breast cancer in situ 09/06/2017   Ductal carcinoma in situ (DCIS) of  left breast 08/19/2017   Malignant neoplasm of upper-outer quadrant of left breast in female, estrogen receptor positive (HCC) 08/17/2017   Low back pain 12/02/2016   Left hamstring injury 11/01/2013   Nonallopathic lesion of cervical region 11/01/2013   Nonallopathic lesion of lumbosacral region 11/01/2013   Nonallopathic lesion of thoracic region 11/01/2013    PCP:   REFERRING PROVIDER:Paul Anders Grant DIAG:s/p Mastectomy 2018, Left shoulder pain  THERAPY DIAG:  Shoulder pain, unspecified chronicity, unspecified laterality  Abnormal posture  Ductal carcinoma in situ of left  breast  ONSET DATE: Spring 2024  Rationale for Evaluation and Treatment: Rehabilitation  SUBJECTIVE:                                                                                                                                                                                           SUBJECTIVE STATEMENT:  I have been doing the stretches at least once a day. As the front is loosening up I can feel more tightness in the back of my armpit.   PERTINENT HISTORY:  Pt with bilateral mastectomy on 09/06/2017 with 2 nodes removed on left side due to DCIS She did not have to have chemo , radiation or hormone treatment , mild shoulder issues with left shoulder, but has history of shoulder issues on her right shoulder,   PAIN:  Are you having pain? No  PRECAUTIONS: Left UE lymphedema risk, Cervical DDD with right UE radiculopathy, weakness  RED FLAGS: None   WEIGHT BEARING RESTRICTIONS: No  FALLS:  Has patient fallen in last 6 months? Yes. Number of falls fell while hiking in [place] a number of times  LIVING ENVIRONMENT: Lives with: lives with their spouse Lives in: House/apartment Stairs: Yes; Internal: 18 steps; on right going up and External: 6 steps; on right going up Has following equipment at home:   OCCUPATION: MD ,virtual medicine, Integrative Medicine part time  LEISURE: Travel, hiking, strength training at [employer], biking, kayaking  HAND DOMINANCE: right   PRIOR LEVEL OF FUNCTION: Independent  PATIENT GOALS: Decrease anxiety, improve ROM and decrease tightness   OBJECTIVE: Note: Objective measures were completed at Evaluation unless otherwise noted.  COGNITION: Overall cognitive status: Within functional limits for tasks assessed   PALPATION: Tightness noted left pectorals axillary border and bilateral UT, no significant tenderness  OBSERVATIONS / OTHER ASSESSMENTS: Right shoulder low,Bilateral Mastectomy incisions well healed  SENSATION: Light touch: Deficits       POSTURE: forward head, rounded shoulders  UPPER EXTREMITY AROM/PROM:  A/PROM RIGHT   eval   Shoulder extension 47  Shoulder flexion 155  Shoulder abduction 165 crepitus in shoulder  Shoulder internal rotation 55 muscles sore  Shoulder external rotation 106    (Blank rows = not tested)  A/PROM LEFT   eval  Shoulder extension 55  Shoulder flexion 155  Shoulder abduction 157  Shoulder internal rotation 65  Shoulder external rotation 96 crepitus in neck    (Blank rows = not tested)  CERVICAL AROM: Bilateral Rotation and SB decreased 50% crepitus , FB WNL, BB not tested  EMPTY CAN: Negative and with good strength LEFT IMPINGEMENT: Negative   UPPER EXTREMITY STRENGTH: LEFT; 4+/5 throughout, No pain  LYMPHEDEMA ASSESSMENTS:   SURGERY TYPE/DATE: 09/06/2017, bilateral Mastectomies  NUMBER OF LYMPH NODES REMOVED: 0/2  CHEMOTHERAPY: NO  RADIATION:NO  HORMONE TREATMENT: NO  INFECTIONS: NO   LYMPHEDEMA ASSESSMENTS:   LANDMARK RIGHT  eval  At axilla    15 cm proximal to olecranon process   10 cm proximal to olecranon process   Olecranon process   15 cm proximal to ulnar styloid process   10 cm proximal to ulnar styloid process   Just proximal to ulnar styloid process   Across hand at thumb web space   At base of 2nd digit   (Blank rows = not tested)  LANDMARK LEFT  eval  At axilla    15 cm proximal to olecranon process   10 cm proximal to olecranon process   Olecranon process   15 cm proximal to ulnar styloid process   10 cm proximal to ulnar styloid process   Just proximal to ulnar styloid process   Across hand at thumb web space   At base of 2nd digit   (Blank rows = not tested)   FUNCTIONAL TESTS:    GAIT:WNL   QUICK DASH SURVEY: 9%   TODAY'S TREATMENT:                                                                                                                                          DATE:  10/04/23: Instructed pt in lying on R  side over foam roller (just inferior to axilla) stretching L arm overhead and turning upper body to feel a stretch and holding for 60 sec x 2 Child pose - walking fingers to R side for a L lat stretch x 2-3 min holds  Supine over blue ball (between scapular) hooklying with fingers laced behind head and head heavy in head - leaning backwards over ball with elbows forward then coming back up and leaning backwards with elbows out to the sides x 20 reps each for thoracic mobility and chest stretch Supine over foam roll doing alternating flexion with 30-60 sec holds x 10 reps each then in scaption x 10 reps each  09/24/2023 Pt was educated in supine stargazer stretch, pectoral corner stretch and single arm (LEFT) pectoral stretch in doorway. She was advised to perform 2x/s daily and not to stretch to the point of pain. Use  caution with Right UE and if corner stretch is painful may do single arm stretch in doorway.    PATIENT EDUCATION:  Education details: Pt was educated in supine stargazer stretch, pectoral corner stretch and single arm (LEFT) pectoral stretch in doorway. She was advised to perform 2x/s daily and not to stretch to the point of pain. Use caution with Right UE and if corner stretch is painful may do single arm stretch in doorway. Person educated: Patient Education method: Explanation, Demonstration, and Handouts Education comprehension: verbalized understanding and returned demonstration  HOME EXERCISE PROGRAM: Pectoral corner stretch, supine stargazer, single arm pec stretch Access Code: [email or web address] URL: [email or web address] Date: 10/05/2023 Prepared by: Leonette Most  Exercises - Supine Upper-Thoracic Mobilization   - 1 x daily - 7 x weekly - 1 sets - 15-30 reps - Supine Thoracic Mobilization Foam Roll Vertical  - 1 x daily - 7 x weekly - 1 sets - 10 reps - Child's Pose with Sidebending  - 1 x daily - 7 x weekly - 1 sets - 2-3 reps - 1-2 min hold -  Latissimus Mobilization on Foam Roll  - 1 x daily - 7 x weekly - 1 sets - 2 reps - 1-3 min hold ASSESSMENT:  CLINICAL IMPRESSION: Instructed pt in upper thoracic mobility exercises and pec stretches today and added these to her home exercise program. Pt felt a great stretch will all exercises today. She has a foam roll at home but it is more firm so educated her she can use a rolled yoga mat. Educated pt that if she is sore to wait a day to do them again.    OBJECTIVE IMPAIRMENTS: decreased knowledge of condition, decreased ROM, impaired UE functional use, postural dysfunction, and pain.   ACTIVITY LIMITATIONS: sleeping and reach over head  PARTICIPATION LIMITATIONS:  doing all but with tightness and mild pain with certain activities  PERSONAL FACTORS: 1-2 comorbidities: bilateral Mastectomies for left breast Cancer, Cervical DDD, shoulder pain  are also affecting patient's functional outcome.   REHAB POTENTIAL: Excellent  CLINICAL DECISION MAKING: Stable/uncomplicated  EVALUATION COMPLEXITY: Low  GOALS: Goals reviewed with patient? Yes  SHORT TERM GOALS=LONG TERM GOAL: Target date: 10/22/2023  Pt will be independent in a HEP for shoulder ROM and strength Baseline: Goal status: INITIAL  2.  Pt will have decreased LEFT shoulder pain by 50% Baseline:  Goal status: INITIAL  3. Pt will have decreased chest/axillary tightness by 50% or greater Baseline:  Goal status: INITIAL  4.  Pt will be educated in [facility] strength handout and will be able to perform with good technique Baseline:  Goal status: INITIAL   PLAN:  PT FREQUENCY: 2x/week  PT DURATION: 4 weeks  PLANNED INTERVENTIONS: 97164- PT Re-evaluation, 97110-Therapeutic exercises, 97112- Neuromuscular re-education, 97535- Self Care, 16109- Manual therapy, Patient/Family education, Joint mobilization, Therapeutic exercises, Therapeutic activity, Neuromuscular re-education, and Self Care  PLAN FOR NEXT SESSION: STM  bilateral UT/left pectorals especially axillary border, review chest stretches for HEP, postural TB, progress to ABC strength handout (Be mindful of cervical DDD and right greater than left shoulder pain.)  Leonette Most, PT 10/04/2023, 1:07 PM

## 2023-10-11 DIAGNOSIS — M9901 Segmental and somatic dysfunction of cervical region: Secondary | ICD-10-CM | POA: Diagnosis not present

## 2023-10-11 DIAGNOSIS — M542 Cervicalgia: Secondary | ICD-10-CM | POA: Diagnosis not present

## 2023-10-11 DIAGNOSIS — M9902 Segmental and somatic dysfunction of thoracic region: Secondary | ICD-10-CM | POA: Diagnosis not present

## 2023-10-11 DIAGNOSIS — M9908 Segmental and somatic dysfunction of rib cage: Secondary | ICD-10-CM | POA: Diagnosis not present

## 2023-10-18 ENCOUNTER — Ambulatory Visit: Payer: PPO | Attending: General Surgery

## 2023-10-18 DIAGNOSIS — R293 Abnormal posture: Secondary | ICD-10-CM | POA: Insufficient documentation

## 2023-10-18 DIAGNOSIS — M25519 Pain in unspecified shoulder: Secondary | ICD-10-CM | POA: Diagnosis not present

## 2023-10-18 DIAGNOSIS — D0512 Intraductal carcinoma in situ of left breast: Secondary | ICD-10-CM | POA: Diagnosis not present

## 2023-10-18 NOTE — Therapy (Signed)
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 DOB:[birth year], 72 y.o., female Today's Date: 10/18/2023  END OF SESSION:  PT End of Session - 10/18/23 0906     Visit Number 3    Number of Visits 8    Date for PT Re-Evaluation 10/22/23    PT Start Time 0906    PT Stop Time 1000    PT Time Calculation (min) 54 min    Activity Tolerance Patient tolerated treatment well    Behavior During Therapy [facility] for tasks assessed/performed              Past Medical History:  Diagnosis Date   Anxiety    Arthritis    Family history of breast cancer    Family history of melanoma    Family history of pancreatic cancer    History of basal cell carcinoma (BCC) excision 2014   left mid back   History of fungal infection 2018   systemic fungal infection due to black mold exposure   Hyperlipidemia    Hypothyroidism    followed by pcp   Irregular heartbeat    per pt asymptomatic ;   last cardiology visit w/ dr Graciela Husbands 07-03-2020 for abnormal EKG, Atrial ectopy/ PACs w/ NS atrial tachycardia; work-up results in epic,  event monitor 04/ 2019 showed SR with PACs,  normal echo 04/ 2019, nuclear stress test showed normal no ischemia normal LVF nuclear ef 70%   Malignant neoplasm of upper-outer quadrant of left breast in female, 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 3 months ago,  had epi injeciton approx one month ago, followed by dr Berline Chough,  no rom restriction but do not hyperextend neck   Wears contact lenses    Past Surgical History:  Procedure Laterality Date   APPENDECTOMY  1983   CHOLECYSTECTOMY OPEN  1975   DILATATION & CURETTAGE/HYSTEROSCOPY WITH MYOSURE N/A 12/08/2022   Procedure: DILATATION &  CURETTAGE/HYSTEROSCOPY WITH MYOSURE;  Surgeon: Olivia Mackie, MD;  Location: [facility];  Service: Gynecology;  Laterality: N/A;   MASTECTOMY W/ SENTINEL NODE BIOPSY Bilateral 09/06/2017   Procedure: LEFT MASTECTOMY WITH LEFT SENTINEL LYMPH NODE BIOPSY AND  RIGHT PROPHYLACTIC MASTECTOMY;  Surgeon: Griselda Miner, MD;  Location: [facility] OR;  Service: General;  Laterality: Bilateral;   OOPHORECTOMY Bilateral    ROBOTIC ASSISTED SALPINGO OOPHERECTOMY Bilateral 05/20/2021   Procedure: XI ROBOTIC ASSISTED SALPINGO OOPHORECTOMY;  Surgeon: Olivia Mackie, MD;  Location: [facility] [place];  Service: Gynecology;  Laterality: Bilateral;   TONSILLECTOMY AND ADENOIDECTOMY     child   Patient Active Problem List   Diagnosis Date Noted   Changing skin lesion 11/11/2021   Basal cell carcinoma 08/12/2021   Atrial tachycardia (HCC) - non sustained 08/06/2021   PAC (premature atrial contraction) 08/06/2021   Preop cardiovascular exam 11/14/2019   Hyperlipidemia with target LDL less than 130 11/13/2019   Family history of breast cancer    Family history of melanoma    Family history of pancreatic cancer    Patellofemoral arthritis of right knee 02/28/2018   Palpitations 02/10/2018   Abnormal finding on EKG 02/10/2018   Breast cancer in situ 09/06/2017   Ductal carcinoma in situ (DCIS) of  left breast 08/19/2017   Malignant neoplasm of upper-outer quadrant of left breast in female, estrogen receptor positive (HCC) 08/17/2017   Low back pain 12/02/2016   Left hamstring injury 11/01/2013   Nonallopathic lesion of cervical region 11/01/2013   Nonallopathic lesion of lumbosacral region 11/01/2013   Nonallopathic lesion of thoracic region 11/01/2013    PCP:   REFERRING PROVIDER:Paul Anders Grant DIAG:s/p Mastectomy 2018, Left shoulder pain  THERAPY DIAG:  Shoulder pain, unspecified chronicity, unspecified laterality  Abnormal posture  Ductal carcinoma in situ of left  breast  ONSET DATE: Spring 2024  Rationale for Evaluation and Treatment: Rehabilitation  SUBJECTIVE:                                                                                                                                                                                           SUBJECTIVE STATEMENT:  I can stand straighter now; My chest tightness is much better, and I don't notice the posterior tightness as much. Shoulder pain is about 90% better.. Traps are still a little sore but not bad. I am very pleased with my progress.  PERTINENT HISTORY:  Pt with bilateral mastectomy on 09/06/2017 with 2 nodes removed on left side due to DCIS She did not have to have chemo , radiation or hormone treatment , mild shoulder issues with left shoulder, but has history of shoulder issues on her right shoulder,   PAIN:  Are you having pain? No  PRECAUTIONS: Left UE lymphedema risk, Cervical DDD with right UE radiculopathy, weakness  RED FLAGS: None   WEIGHT BEARING RESTRICTIONS: No  FALLS:  Has patient fallen in last 6 months? Yes. Number of falls fell while hiking in [place] a number of times  LIVING ENVIRONMENT: Lives with: lives with their spouse Lives in: House/apartment Stairs: Yes; Internal: 18 steps; on right going up and External: 6 steps; on right going up Has following equipment at home:   OCCUPATION: MD ,virtual medicine, Integrative Medicine part time  LEISURE: Travel, hiking, strength training at [employer], biking, kayaking  HAND DOMINANCE: right   PRIOR LEVEL OF FUNCTION: Independent  PATIENT GOALS: Decrease anxiety, improve ROM and decrease tightness   OBJECTIVE: Note: Objective measures were completed at Evaluation unless otherwise noted.  COGNITION: Overall cognitive status: Within functional limits for tasks assessed   PALPATION: Tightness noted left pectorals axillary border and bilateral UT, no significant tenderness  OBSERVATIONS / OTHER ASSESSMENTS: Right  shoulder low,Bilateral Mastectomy incisions well healed  SENSATION: Light touch: Deficits      POSTURE: forward head, rounded shoulders  UPPER EXTREMITY AROM/PROM:  A/PROM RIGHT   eval   Shoulder extension  47  Shoulder flexion 155  Shoulder abduction 165 crepitus in shoulder  Shoulder internal rotation 55 muscles sore  Shoulder external rotation 106    (Blank rows = not tested)  A/PROM LEFT   eval  Shoulder extension 55  Shoulder flexion 155  Shoulder abduction 157  Shoulder internal rotation 65  Shoulder external rotation 96 crepitus in neck    (Blank rows = not tested)  CERVICAL AROM: Bilateral Rotation and SB decreased 50% crepitus , FB WNL, BB not tested  EMPTY CAN: Negative and with good strength LEFT IMPINGEMENT: Negative   UPPER EXTREMITY STRENGTH: LEFT; 4+/5 throughout, No pain  LYMPHEDEMA ASSESSMENTS:   SURGERY TYPE/DATE: 09/06/2017, bilateral Mastectomies  NUMBER OF LYMPH NODES REMOVED: 0/2  CHEMOTHERAPY: NO  RADIATION:NO  HORMONE TREATMENT: NO  INFECTIONS: NO   LYMPHEDEMA ASSESSMENTS:   LANDMARK RIGHT  eval  At axilla    15 cm proximal to olecranon process   10 cm proximal to olecranon process   Olecranon process   15 cm proximal to ulnar styloid process   10 cm proximal to ulnar styloid process   Just proximal to ulnar styloid process   Across hand at thumb web space   At base of 2nd digit   (Blank rows = not tested)  LANDMARK LEFT  eval  At axilla    15 cm proximal to olecranon process   10 cm proximal to olecranon process   Olecranon process   15 cm proximal to ulnar styloid process   10 cm proximal to ulnar styloid process   Just proximal to ulnar styloid process   Across hand at thumb web space   At base of 2nd digit   (Blank rows = not tested)   FUNCTIONAL TESTS:    GAIT:WNL   QUICK DASH SURVEY: 9%   TODAY'S TREATMENT:                                                                                                                                           DATE:   10/18/2023 Supine over half foam roll flexion, scaption, horizontal abd x 10 to warm up Child pose - walking fingers to R side for a L lat stretch and to the left to feel right trunk stretch  2 x 2-3 min holds  Educated in standing postural exercises with theraband;scapular retraction, shoulder extension and bilateral ER x 10, repeated again x 5. Educated in stabilizing while performing exercises, proper posture and breathing mechanics. Jobes flexion and scaption 0-1# x 10 reps each. Mild right shoulder discomfort alleviated by depressing right scapula Pt advised to continue stretches daily and resistance exs only every other day.  10/04/23: 2 Instructed pt in lying on R side over foam roller (just inferior to axilla) stretching L arm overhead and turning upper body to feel a stretch and holding for 60 sec x 2 Child pose - walking  fingers to R side for a L lat stretch x 2-3 min holds  Supine over blue ball (between scapular) hooklying with fingers laced behind head and head heavy in head - leaning backwards over ball with elbows forward then coming back up and leaning backwards with elbows out to the sides x 20 reps each for thoracic mobility and chest stretch Supine over foam roll doing alternating flexion with 30-60 sec holds x 10 reps each then in scaption x 10 reps each  09/24/2023 Pt was educated in supine stargazer stretch, pectoral corner stretch and single arm (LEFT) pectoral stretch in doorway. She was advised to perform 2x/s daily and not to stretch to the point of pain. Use caution with Right UE and if corner stretch is painful may do single arm stretch in doorway.    PATIENT EDUCATION:  Education details: Pt was educated in supine stargazer stretch, pectoral corner stretch and single arm (LEFT) pectoral stretch in doorway. She was advised to perform 2x/s daily and not to stretch to the point of pain. Use caution with Right UE and  if corner stretch is painful may do single arm stretch in doorway. Person educated: Patient Education method: Explanation, Demonstration, and Handouts Education comprehension: verbalized understanding and returned demonstration  HOME EXERCISE PROGRAM: Pectoral corner stretch, supine stargazer, single arm pec stretch Access Code: [email or web address] URL: [email or web address] Date: 10/05/2023 Prepared by: Leonette Most  Exercises - Supine Upper-Thoracic Mobilization   - 1 x daily - 7 x weekly - 1 sets - 15-30 reps - Supine Thoracic Mobilization Foam Roll Vertical  - 1 x daily - 7 x weekly - 1 sets - 10 reps - Child's Pose with Sidebending  - 1 x daily - 7 x weekly - 1 sets - 2-3 reps - 1-2 min hold - Latissimus Mobilization on Foam Roll  - 1 x daily - 7 x weekly - 1 sets - 2 reps - 1-3 min hold ASSESSMENT:  CLINICAL IMPRESSION: Pt is very pleased with her progress. She notes shoulder pain 90% better, and is demonstrating excellent awareness of proper posture.She has achieved 3 of 4 goals established.   OBJECTIVE IMPAIRMENTS: decreased knowledge of condition, decreased ROM, impaired UE functional use, postural dysfunction, and pain.   ACTIVITY LIMITATIONS: sleeping and reach over head  PARTICIPATION LIMITATIONS:  doing all but with tightness and mild pain with certain activities  PERSONAL FACTORS: 1-2 comorbidities: bilateral Mastectomies for left breast Cancer, Cervical DDD, shoulder pain  are also affecting patient's functional outcome.   REHAB POTENTIAL: Excellent  CLINICAL DECISION MAKING: Stable/uncomplicated  EVALUATION COMPLEXITY: Low  GOALS: Goals reviewed with patient? Yes  SHORT TERM GOALS=LONG TERM GOAL: Target date: 10/22/2023  Pt will be independent in a HEP for shoulder ROM and strength Baseline: Goal status: MET 10/18/2023 2.  Pt will have decreased LEFT shoulder pain by 50% Baseline:  Goal status: MET 10/18/2023 3. Pt will have decreased  chest/axillary tightness by 50% or greater Baseline:  Goal status: MET 10/18/2023  4.  Pt will be educated in [facility] strength handout and will be able to perform with good technique Baseline:  Goal status: INITIAL   PLAN:  PT FREQUENCY: 2x/week  PT DURATION: 4 weeks  PLANNED INTERVENTIONS: 97164- PT Re-evaluation, 97110-Therapeutic exercises, 97112- Neuromuscular re-education, 97535- Self Care, 13086- Manual therapy, Patient/Family education, Joint mobilization, Therapeutic exercises, Therapeutic activity, Neuromuscular re-education, and Self Care  PLAN FOR NEXT SESSION: initiate ABC strength,STM bilateral UT/left pectorals especially axillary border, review chest stretches for HEP, postural TB,  progress to ABC strength handout (Be mindful of cervical DDD and right greater than left shoulder pain.)  Waynette Buttery, PT 10/18/2023, 10:11 AM

## 2023-10-25 ENCOUNTER — Ambulatory Visit: Payer: PPO

## 2023-10-25 DIAGNOSIS — M25519 Pain in unspecified shoulder: Secondary | ICD-10-CM

## 2023-10-25 DIAGNOSIS — R293 Abnormal posture: Secondary | ICD-10-CM

## 2023-10-25 DIAGNOSIS — D0512 Intraductal carcinoma in situ of left breast: Secondary | ICD-10-CM

## 2023-10-25 NOTE — Therapy (Signed)
OUTPATIENT PHYSICAL THERAPY  UPPER EXTREMITY ONCOLOGY TREATMENT  Patient Name: Whitney Sligh, MD MRN: [id number] DOB:03-29-1951, 72 y.o., female Today's Date: 10/25/2023  END OF SESSION:  PT End of Session - 10/25/23 0902     Visit Number 4    Number of Visits 8    Date for PT Re-Evaluation 12/06/23    PT Start Time 0903    PT Stop Time 0951    PT Time Calculation (min) 48 min    Activity Tolerance Patient tolerated treatment well    Behavior During Therapy [facility] for tasks assessed/performed              Past Medical History:  Diagnosis Date   Anxiety    Arthritis    Family history of breast cancer    Family history of melanoma    Family history of pancreatic cancer    History of basal cell carcinoma (BCC) excision 2014   left mid back   History of fungal infection 2018   systemic fungal infection due to black mold exposure   Hyperlipidemia    Hypothyroidism    followed by pcp   Irregular heartbeat    per pt asymptomatic ;   last cardiology visit w/ dr Graciela Husbands 07-03-2020 for abnormal EKG, Atrial ectopy/ PACs w/ NS atrial tachycardia; work-up results in epic,  event monitor 04/ 2019 showed SR with PACs,  normal echo 04/ 2019, nuclear stress test showed normal no ischemia normal LVF nuclear ef 70%   Malignant neoplasm of upper-outer quadrant of left breast in female, 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 3 months ago,  had epi injeciton approx one month ago, followed by dr Berline Chough,  no rom restriction but do not hyperextend neck   Wears contact lenses    Past Surgical History:  Procedure Laterality Date   APPENDECTOMY  1983   CHOLECYSTECTOMY OPEN  1975   DILATATION & CURETTAGE/HYSTEROSCOPY WITH MYOSURE N/A 12/08/2022   Procedure: DILATATION &  CURETTAGE/HYSTEROSCOPY WITH MYOSURE;  Surgeon: Olivia Mackie, MD;  Location: [facility];  Service: Gynecology;  Laterality: N/A;   MASTECTOMY W/ SENTINEL NODE BIOPSY Bilateral 09/06/2017   Procedure: LEFT MASTECTOMY WITH LEFT SENTINEL LYMPH NODE BIOPSY AND  RIGHT PROPHYLACTIC MASTECTOMY;  Surgeon: Griselda Miner, MD;  Location: [facility] OR;  Service: General;  Laterality: Bilateral;   OOPHORECTOMY Bilateral    ROBOTIC ASSISTED SALPINGO OOPHERECTOMY Bilateral 05/20/2021   Procedure: XI ROBOTIC ASSISTED SALPINGO OOPHORECTOMY;  Surgeon: Olivia Mackie, MD;  Location: [facility] [place] [facility];  Service: Gynecology;  Laterality: Bilateral;   TONSILLECTOMY AND ADENOIDECTOMY     child   Patient Active Problem List   Diagnosis Date Noted   Changing skin lesion 11/11/2021   Basal cell carcinoma 08/12/2021   Atrial tachycardia (HCC) - non sustained 08/06/2021   PAC (premature atrial contraction) 08/06/2021   Preop cardiovascular exam 11/14/2019   Hyperlipidemia with target LDL less than 130 11/13/2019   Family history of breast cancer    Family history of melanoma    Family history of pancreatic cancer    Patellofemoral arthritis of right knee 02/28/2018   Palpitations 02/10/2018   Abnormal finding on EKG 02/10/2018   Breast cancer in situ 09/06/2017   Ductal carcinoma in situ (DCIS) of  left breast 08/19/2017   Malignant neoplasm of upper-outer quadrant of left breast in female, estrogen receptor positive (HCC) 08/17/2017   Low back pain 12/02/2016   Left hamstring injury 11/01/2013   Nonallopathic lesion of cervical region 11/01/2013   Nonallopathic lesion of lumbosacral region 11/01/2013   Nonallopathic lesion of thoracic region 11/01/2013    PCP:   REFERRING PROVIDER:Paul Anders Grant DIAG:s/p Mastectomy 2018, Left shoulder pain  THERAPY DIAG:  Shoulder pain, unspecified chronicity, unspecified laterality  Abnormal posture  Ductal carcinoma in situ of left  breast  ONSET DATE: Spring 2024  Rationale for Evaluation and Treatment: Rehabilitation  SUBJECTIVE:                                                                                                                                                                                           SUBJECTIVE STATEMENT:  I tried the band exercises 2 x's and I can stand on my vibration plate to do it. Because I am more limber I can stand straighter. I have to remind myself to look up. I am feeling so much better overall. I feel like I could run now.  PERTINENT HISTORY:  Pt with bilateral mastectomy on 09/06/2017 with 2 nodes removed on left side due to DCIS She did not have to have chemo , radiation or hormone treatment , mild shoulder issues with left shoulder, but has history of shoulder issues on her right shoulder,   PAIN:  Are you having pain? No  PRECAUTIONS: Left UE lymphedema risk, Cervical DDD with right UE radiculopathy, weakness  RED FLAGS: None   WEIGHT BEARING RESTRICTIONS: No  FALLS:  Has patient fallen in last 6 months? Yes. Number of falls fell while hiking in [place] a number of times  LIVING ENVIRONMENT: Lives with: lives with their spouse Lives in: House/apartment Stairs: Yes; Internal: 18 steps; on right going up and External: 6 steps; on right going up Has following equipment at home:   OCCUPATION: MD ,virtual medicine, Integrative Medicine part time  LEISURE: Travel, hiking, strength training at [employer], biking, kayaking  HAND DOMINANCE: right   PRIOR LEVEL OF FUNCTION: Independent  PATIENT GOALS: Decrease anxiety, improve ROM and decrease tightness   OBJECTIVE: Note: Objective measures were completed at Evaluation unless otherwise noted.  COGNITION: Overall cognitive status: Within functional limits for tasks assessed   PALPATION: Tightness noted left pectorals axillary border and bilateral UT, no significant tenderness  OBSERVATIONS / OTHER ASSESSMENTS: Right  shoulder low,Bilateral Mastectomy incisions well healed  SENSATION: Light touch: Deficits      POSTURE: forward head, rounded shoulders  UPPER EXTREMITY AROM/PROM:  A/PROM RIGHT  eval   Shoulder extension 47  Shoulder flexion 155  Shoulder abduction 165 crepitus in shoulder  Shoulder internal rotation 55 muscles sore  Shoulder external rotation 106    (Blank rows = not tested)  A/PROM LEFT   eval  Shoulder extension 55  Shoulder flexion 155  Shoulder abduction 157  Shoulder internal rotation 65  Shoulder external rotation 96 crepitus in neck    (Blank rows = not tested)  CERVICAL AROM: Bilateral Rotation and SB decreased 50% crepitus , FB WNL, BB not tested  EMPTY CAN: Negative and with good strength LEFT IMPINGEMENT: Negative   UPPER EXTREMITY STRENGTH: LEFT; 4+/5 throughout, No pain  LYMPHEDEMA ASSESSMENTS:   SURGERY TYPE/DATE: 09/06/2017, bilateral Mastectomies  NUMBER OF LYMPH NODES REMOVED: 0/2  CHEMOTHERAPY: NO  RADIATION:NO  HORMONE TREATMENT: NO  INFECTIONS: NO   LYMPHEDEMA ASSESSMENTS:   LANDMARK RIGHT  eval  At axilla    15 cm proximal to olecranon process   10 cm proximal to olecranon process   Olecranon process   15 cm proximal to ulnar styloid process   10 cm proximal to ulnar styloid process   Just proximal to ulnar styloid process   Across hand at thumb web space   At base of 2nd digit   (Blank rows = not tested)  LANDMARK LEFT  eval  At axilla    15 cm proximal to olecranon process   10 cm proximal to olecranon process   Olecranon process   15 cm proximal to ulnar styloid process   10 cm proximal to ulnar styloid process   Just proximal to ulnar styloid process   Across hand at thumb web space   At base of 2nd digit   (Blank rows = not tested)   FUNCTIONAL TESTS:    GAIT:WNL   QUICK DASH SURVEY: 9%   TODAY'S TREATMENT:                                                                                                                                           DATE:   10/2024 Supine over half foam roll flexion, scaption, horizontal abd x 10 to warm up, horizontal abd with yellow band x 10 Instructed in ABC handout up through flexibility, and page 16 of strength. Pt did well with all exercises and felt appropriate tightness with flexibility exs, and no pain with strength activities. She required very few cues for position and used good posture throughout. We discussed using significant caution with return to running due to arthritis. Very short 15 - 30 seconds several times while walking before building up. She verbalized understanding.     10/18/2023 Supine over half foam roll flexion, scaption, horizontal abd x 10 to warm up Child pose - walking fingers to R side for a L lat stretch and to the left to feel right trunk stretch  2 x 2-3 min holds  Educated in standing postural exercises with theraband;scapular retraction, shoulder extension and bilateral ER x 10, repeated again x 5. Educated in stabilizing while performing exercises, proper posture and breathing mechanics. Jobes flexion and scaption 0-1# x 10 reps each. Mild right shoulder discomfort alleviated by depressing right scapula Pt advised to continue stretches daily and resistance exs only every other day.  10/04/23: 2 Instructed pt in lying on R side over foam roller (just inferior to axilla) stretching L arm overhead and turning upper body to feel a stretch and holding for 60 sec x 2 Child pose - walking fingers to R side for a L lat stretch x 2-3 min holds  Supine over blue ball (between scapular) hooklying with fingers laced behind head and head heavy in head - leaning backwards over ball with elbows forward then coming back up and leaning backwards with elbows out to the sides x 20 reps each for thoracic mobility and chest stretch Supine over foam roll doing alternating flexion with 30-60 sec holds x 10 reps each then in scaption x 10 reps  each  09/24/2023 Pt was educated in supine stargazer stretch, pectoral corner stretch and single arm (LEFT) pectoral stretch in doorway. She was advised to perform 2x/s daily and not to stretch to the point of pain. Use caution with Right UE and if corner stretch is painful may do single arm stretch in doorway.    PATIENT EDUCATION:  Education details: Pt was educated in supine stargazer stretch, pectoral corner stretch and single arm (LEFT) pectoral stretch in doorway. She was advised to perform 2x/s daily and not to stretch to the point of pain. Use caution with Right UE and if corner stretch is painful may do single arm stretch in doorway. Person educated: Patient Education method: Explanation, Demonstration, and Handouts Education comprehension: verbalized understanding and returned demonstration  HOME EXERCISE PROGRAM: Pectoral corner stretch, supine stargazer, single arm pec stretch Access Code: [email or web address] URL: [email or web address] Date: 10/05/2023 Prepared by: Leonette Most  Exercises - Supine Upper-Thoracic Mobilization   - 1 x daily - 7 x weekly - 1 sets - 15-30 reps - Supine Thoracic Mobilization Foam Roll Vertical  - 1 x daily - 7 x weekly - 1 sets - 10 reps - Child's Pose with Sidebending  - 1 x daily - 7 x weekly - 1 sets - 2-3 reps - 1-2 min hold - Latissimus Mobilization on Foam Roll  - 1 x daily - 7 x weekly - 1 sets - 2 reps - 1-3 min hold ASSESSMENT:  CLINICAL IMPRESSION: Pt is very pleased with her progress. She notes shoulder pain 90% better,  and chest tightness atleast 50% better. She  is demonstrating excellent awareness of proper posture.She has achieved 3 of 4 goals established. We initiated the ABC strength handout today and have 1 more session required to complete it. She has a very busy schedule over the holidays and will not return until January. Dates have been extended to accommodate her last visit required to complete ABC  strength.   OBJECTIVE IMPAIRMENTS: decreased knowledge of condition, decreased ROM, impaired UE functional use, postural dysfunction, and pain.   ACTIVITY LIMITATIONS: sleeping and reach over head  PARTICIPATION LIMITATIONS:  doing all but with tightness and mild pain with certain activities  PERSONAL FACTORS: 1-2 comorbidities: bilateral Mastectomies for left breast Cancer, Cervical DDD, shoulder pain  are also affecting patient's functional outcome.   REHAB POTENTIAL: Excellent  CLINICAL DECISION MAKING: Stable/uncomplicated  EVALUATION COMPLEXITY: Low  GOALS:  Goals reviewed with patient? Yes  SHORT TERM GOALS=LONG TERM GOAL: Target date: 10/22/2023  Pt will be independent in a HEP for shoulder ROM and strength Baseline: Goal status: MET 10/18/2023 2.  Pt will have decreased LEFT shoulder pain by 50% Baseline:  Goal status: MET 10/18/2023 3. Pt will have decreased chest/axillary tightness by 50% or greater Baseline:  Goal status: MET 10/18/2023  4.  Pt will be educated in [facility] strength handout and will be able to perform with good technique Baseline:  Goal status: INITIAL   PLAN:  PT FREQUENCY: 1 more visit PT DURATION: 4-6 weeks  PLANNED INTERVENTIONS: 97164- PT Re-evaluation, 97110-Therapeutic exercises, 97112- Neuromuscular re-education, 97535- Self Care, [id number]- Manual therapy, Patient/Family education, Joint mobilization, Therapeutic exercises, Therapeutic activity, Neuromuscular re-education, and Self Care  PLAN FOR NEXT SESSION: complete ABC strength, DC next visit if ready, review band prn (Be mindful of cervical DDD and right greater than left shoulder pain.)  Waynette Buttery, PT 10/25/2023, 1:13 PM

## 2023-10-30 DIAGNOSIS — J209 Acute bronchitis, unspecified: Secondary | ICD-10-CM | POA: Diagnosis not present

## 2023-10-30 DIAGNOSIS — J019 Acute sinusitis, unspecified: Secondary | ICD-10-CM | POA: Diagnosis not present

## 2023-10-30 DIAGNOSIS — R059 Cough, unspecified: Secondary | ICD-10-CM | POA: Diagnosis not present

## 2023-10-30 DIAGNOSIS — Z20822 Contact with and (suspected) exposure to covid-19: Secondary | ICD-10-CM | POA: Diagnosis not present

## 2023-11-19 ENCOUNTER — Ambulatory Visit: Payer: PPO | Attending: General Surgery

## 2023-11-19 DIAGNOSIS — R293 Abnormal posture: Secondary | ICD-10-CM | POA: Insufficient documentation

## 2023-11-19 DIAGNOSIS — M25519 Pain in unspecified shoulder: Secondary | ICD-10-CM | POA: Insufficient documentation

## 2023-11-19 DIAGNOSIS — D0512 Intraductal carcinoma in situ of left breast: Secondary | ICD-10-CM | POA: Diagnosis present

## 2023-11-19 NOTE — Therapy (Signed)
 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 DOB:11/22/1950, 73 y.o., female Today's Date: 11/19/2023  END OF SESSION:  PT End of Session - 11/19/23 0902     Visit Number 5    Number of Visits 8    Date for PT Re-Evaluation 12/06/23    PT Start Time 0903    PT Stop Time 0950    PT Time Calculation (min) 47 min    Activity Tolerance Patient tolerated treatment well    Behavior During Therapy [facility] for tasks assessed/performed              Past Medical History:  Diagnosis Date   Anxiety    Arthritis    Family history of breast cancer    Family history of melanoma    Family history of pancreatic cancer    History of basal cell carcinoma (BCC) excision 2014   left mid back   History of fungal infection 2018   systemic fungal infection due to black mold exposure   Hyperlipidemia    Hypothyroidism    followed by pcp   Irregular heartbeat    per pt asymptomatic ;   last cardiology visit w/ dr fernande 07-03-2020 for abnormal EKG, Atrial ectopy/ PACs w/ NS atrial tachycardia; work-up results in epic,  event monitor 04/ 2019 showed SR with PACs,  normal echo 04/ 2019, nuclear stress test showed normal no ischemia normal LVF nuclear ef 70%   Malignant neoplasm of upper-outer quadrant of left breast in female, 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 3 months ago,  had epi injeciton approx one month ago, followed by dr marquette,  no rom restriction but do not hyperextend neck   Wears contact lenses    Past Surgical History:  Procedure Laterality Date   APPENDECTOMY  1983   CHOLECYSTECTOMY OPEN  1975   DILATATION & CURETTAGE/HYSTEROSCOPY WITH MYOSURE N/A 12/08/2022   Procedure: DILATATION &  CURETTAGE/HYSTEROSCOPY WITH MYOSURE;  Surgeon: Gorge Ade, MD;  Location: [facility];  Service: Gynecology;  Laterality: N/A;   MASTECTOMY W/ SENTINEL NODE BIOPSY Bilateral 09/06/2017   Procedure: LEFT MASTECTOMY WITH LEFT SENTINEL LYMPH NODE BIOPSY AND  RIGHT PROPHYLACTIC MASTECTOMY;  Surgeon: Curvin Deward MOULD, MD;  Location: [facility] OR;  Service: General;  Laterality: Bilateral;   OOPHORECTOMY Bilateral    ROBOTIC ASSISTED SALPINGO OOPHERECTOMY Bilateral 05/20/2021   Procedure: XI ROBOTIC ASSISTED SALPINGO OOPHORECTOMY;  Surgeon: Gorge Ade, MD;  Location: [facility] [place];  Service: Gynecology;  Laterality: Bilateral;   TONSILLECTOMY AND ADENOIDECTOMY     child   Patient Active Problem List   Diagnosis Date Noted   Changing skin lesion 11/11/2021   Basal cell carcinoma 08/12/2021   Atrial tachycardia (HCC) - non sustained 08/06/2021   PAC (premature atrial contraction) 08/06/2021   Preop cardiovascular exam 11/14/2019   Hyperlipidemia with target LDL less than 130 11/13/2019   Family history of breast cancer    Family history of melanoma    Family history of pancreatic cancer    Patellofemoral arthritis of right knee 02/28/2018   Palpitations 02/10/2018   Abnormal finding on EKG 02/10/2018   Breast cancer in situ 09/06/2017   Ductal carcinoma in situ (DCIS) of  left breast 08/19/2017   Malignant neoplasm of upper-outer quadrant of left breast in female, estrogen receptor positive (HCC) 08/17/2017   Low back pain 12/02/2016   Left hamstring injury 11/01/2013   Nonallopathic lesion of cervical region 11/01/2013   Nonallopathic lesion of lumbosacral region 11/01/2013   Nonallopathic lesion of thoracic region 11/01/2013    PCP:   REFERRING PROVIDER:Paul Curvin [facility] DIAG:s/p Mastectomy 2018, Left shoulder pain  THERAPY DIAG:  Shoulder pain, unspecified chronicity, unspecified laterality  Abnormal posture  Ductal carcinoma in situ of left  breast  ONSET DATE: Spring 2024  Rationale for Evaluation and Treatment: Rehabilitation  SUBJECTIVE:                                                                                                                                                                                           SUBJECTIVE STATEMENT:  I am doing pretty well. Get stretches in about once a day. I am able to stand straighter.  A little tightness in the shoulder now and then, but no pain. My neck and back and shoulders feel better. I have done the entire ABC strength program 4-5 times and I feel pretty good about it. My pelvis feels off. I stretch my Right psoas and quad insertion and they have been very tender.  PERTINENT HISTORY:  Pt with bilateral mastectomy on 09/06/2017 with 2 nodes removed on left side due to DCIS She did not have to have chemo , radiation or hormone treatment , mild shoulder issues with left shoulder, but has history of shoulder issues on her right shoulder,   PAIN:  Are you having pain? No  PRECAUTIONS: Left UE lymphedema risk, Cervical DDD with right UE radiculopathy, weakness  RED FLAGS: None   WEIGHT BEARING RESTRICTIONS: No  FALLS:  Has patient fallen in last 6 months? Yes. Number of falls fell while hiking in [place] a number of times  LIVING ENVIRONMENT: Lives with: lives with their spouse Lives in: House/apartment Stairs: Yes; Internal: 18 steps; on right going up and External: 6 steps; on right going up Has following equipment at home:   OCCUPATION: MD ,virtual medicine, Integrative Medicine part time  LEISURE: Travel, hiking, strength training at [employer], biking, kayaking  HAND DOMINANCE: right   PRIOR LEVEL OF FUNCTION: Independent  PATIENT GOALS: Decrease anxiety, improve ROM and decrease tightness   OBJECTIVE: Note: Objective measures were completed at Evaluation unless otherwise noted.  COGNITION: Overall cognitive status: Within functional limits for tasks  assessed   PALPATION: Tightness noted left pectorals axillary border and bilateral UT, no significant tenderness  OBSERVATIONS / OTHER ASSESSMENTS: Right shoulder low,Bilateral Mastectomy  incisions well healed  SENSATION: Light touch: Deficits      POSTURE: forward head, rounded shoulders  UPPER EXTREMITY AROM/PROM:  A/PROM RIGHT   eval  RIGHT 11/19/2023  Shoulder extension 47   Shoulder flexion 155 160  Shoulder abduction 165 crepitus in shoulder 178  Shoulder internal rotation 55 muscles sore   Shoulder external rotation 106     (Blank rows = not tested)  A/PROM LEFT   eval LEFT 11/19/2023  Shoulder extension 55   Shoulder flexion 155 164  Shoulder abduction 157 178  Shoulder internal rotation 65   Shoulder external rotation 96 crepitus in neck     (Blank rows = not tested)  CERVICAL AROM: Bilateral Rotation and SB decreased 50% crepitus , FB WNL, BB not tested  EMPTY CAN: Negative and with good strength LEFT IMPINGEMENT: Negative   UPPER EXTREMITY STRENGTH: LEFT; 4+/5 throughout, No pain  LYMPHEDEMA ASSESSMENTS:   SURGERY TYPE/DATE: 09/06/2017, bilateral Mastectomies  NUMBER OF LYMPH NODES REMOVED: 0/2  CHEMOTHERAPY: NO  RADIATION:NO  HORMONE TREATMENT: NO  INFECTIONS: NO   LYMPHEDEMA ASSESSMENTS:   LANDMARK RIGHT  eval  At axilla    15 cm proximal to olecranon process   10 cm proximal to olecranon process   Olecranon process   15 cm proximal to ulnar styloid process   10 cm proximal to ulnar styloid process   Just proximal to ulnar styloid process   Across hand at thumb web space   At base of 2nd digit   (Blank rows = not tested)  LANDMARK LEFT  eval  At axilla    15 cm proximal to olecranon process   10 cm proximal to olecranon process   Olecranon process   15 cm proximal to ulnar styloid process   10 cm proximal to ulnar styloid process   Just proximal to ulnar styloid process   Across hand at thumb web space   At base of 2nd  digit   (Blank rows = not tested)   FUNCTIONAL TESTS: NA   GAIT:WNL   QUICK DASH SURVEY: 9%   TODAY'S TREATMENT:                                                                                                                                          DATE:   11/19/2023 Checked pelvic landmarks;Pt in right anterior ilial rotation Showed pt how to self correct with bilateral knees up and using hands to isometrically contract right hip extensors, right hip flexors Then performed isometric hip abduction, adduction x 3 ea Supine on full foam roll: flexion, scaption, horizontal abd x 10 Educated in supine/sitting piriformis stretch  Supine ppt with bolster squeeze x 10 Supine clam with red x10, showed how to do with horizontal abd of shoulders with red x 5 Discussed SI belt to help stabilize and prevent rotations    10/2024 Supine over half foam  roll flexion, scaption, horizontal abd x 10 to warm up, horizontal abd with yellow band x 10 Instructed in ABC handout up through flexibility, and page 16 of strength. Pt did well with all exercises and felt appropriate tightness with flexibility exs, and no pain with strength activities. She required very few cues for position and used good posture throughout. We discussed using significant caution with return to running due to arthritis. Very short 15 - 30 seconds several times while walking before building up. She verbalized understanding.     10/18/2023 Supine over half foam roll flexion, scaption, horizontal abd x 10 to warm up Child pose - walking fingers to R side for a L lat stretch and to the left to feel right trunk stretch  2 x 2-3 min holds  Educated in standing postural exercises with theraband;scapular retraction, shoulder extension and bilateral ER x 10, repeated again x 5. Educated in stabilizing while performing exercises, proper posture and breathing mechanics. Jobes flexion and scaption 0-1# x 10 reps each. Mild right  shoulder discomfort alleviated by depressing right scapula Pt advised to continue stretches daily and resistance exs only every other day.  10/04/23: 2 Instructed pt in lying on R side over foam roller (just inferior to axilla) stretching L arm overhead and turning upper body to feel a stretch and holding for 60 sec x 2 Child pose - walking fingers to R side for a L lat stretch x 2-3 min holds  Supine over blue ball (between scapular) hooklying with fingers laced behind head and head heavy in head - leaning backwards over ball with elbows forward then coming back up and leaning backwards with elbows out to the sides x 20 reps each for thoracic mobility and chest stretch Supine over foam roll doing alternating flexion with 30-60 sec holds x 10 reps each then in scaption x 10 reps each  09/24/2023 Pt was educated in supine stargazer stretch, pectoral corner stretch and single arm (LEFT) pectoral stretch in doorway. She was advised to perform 2x/s daily and not to stretch to the point of pain. Use caution with Right UE and if corner stretch is painful may do single arm stretch in doorway.    PATIENT EDUCATION:  Education details: Pt was educated in supine stargazer stretch, pectoral corner stretch and single arm (LEFT) pectoral stretch in doorway. She was advised to perform 2x/s daily and not to stretch to the point of pain. Use caution with Right UE and if corner stretch is painful may do single arm stretch in doorway. Person educated: Patient Education method: Explanation, Demonstration, and Handouts Education comprehension: verbalized understanding and returned demonstration  HOME EXERCISE PROGRAM: Pectoral corner stretch, supine stargazer, single arm pec stretch Access Code: [email or web address] URL: [email or web address] Date: 10/05/2023 Prepared by: Florina Lanis Carbon  Exercises - Supine Upper-Thoracic Mobilization   - 1 x daily - 7 x weekly - 1 sets - 15-30 reps - Supine  Thoracic Mobilization Foam Roll Vertical  - 1 x daily - 7 x weekly - 1 sets - 10 reps - Child's Pose with Sidebending  - 1 x daily - 7 x weekly - 1 sets - 2-3 reps - 1-2 min hold - Latissimus Mobilization on Foam Roll  - 1 x daily - 7 x weekly - 1 sets - 2 reps - 1-3 min hold ASSESSMENT:  CLINICAL IMPRESSION: Pt is very pleased with her progress. She notes shoulder pain 90% better,  and chest tightness atleast 50% better. ROM has also improved bilaterally.  She  is demonstrating excellent awareness of proper posture and she is very compliant with her HEP.She has achieved all goals established and feels ready to be discharged    OBJECTIVE IMPAIRMENTS: decreased knowledge of condition, decreased ROM, impaired UE functional use, postural dysfunction, and pain.   ACTIVITY LIMITATIONS: sleeping and reach over head  PARTICIPATION LIMITATIONS:  doing all but with tightness and mild pain with certain activities  PERSONAL FACTORS: 1-2 comorbidities: bilateral Mastectomies for left breast Cancer, Cervical DDD, shoulder pain  are also affecting patient's functional outcome.   REHAB POTENTIAL: Excellent  CLINICAL DECISION MAKING: Stable/uncomplicated  EVALUATION COMPLEXITY: Low  GOALS: Goals reviewed with patient? Yes  SHORT TERM GOALS=LONG TERM GOAL: Target date: 10/22/2023  Pt will be independent in a HEP for shoulder ROM and strength Baseline: Goal status: MET 10/18/2023 2.  Pt will have decreased LEFT shoulder pain by 50% Baseline:  Goal status: MET 10/18/2023 3. Pt will have decreased chest/axillary tightness by 50% or greater Baseline:  Goal status: MET 10/18/2023  4.  Pt will be educated in [facility] strength handout and will be able to perform with good technique Baseline:  Goal status:MET 11/18/2022 per report   PLAN:  PT FREQUENCY: 1 more visit PT DURATION: 4-6 weeks  PLANNED INTERVENTIONS: 97164- PT Re-evaluation, 97110-Therapeutic exercises, 97112- Neuromuscular re-education,  97535- Self Care, 02859- Manual therapy, Patient/Family education, Joint mobilization, Therapeutic exercises, Therapeutic activity, Neuromuscular re-education, and Self Care  PLAN FOR NEXT SESSION:(Be mindful of cervical DDD and right greater than left shoulder pain.)Discharge pt PHYSICAL THERAPY DISCHARGE SUMMARY  Visits from Start of Care: 5  Current functional level related to goals / functional outcomes: Achieved all goals   Remaining deficits: None   Education / Equipment: HEP, theraband   Patient agrees to discharge. Patient goals were met. Patient is being discharged due to meeting the stated rehab goals.  Grayce JINNY Sheldon, PT 11/19/2023, 9:59 AM

## 2023-12-20 ENCOUNTER — Encounter: Payer: Self-pay | Admitting: Genetic Counselor

## 2023-12-20 DIAGNOSIS — Z1509 Genetic susceptibility to other malignant neoplasm: Secondary | ICD-10-CM | POA: Insufficient documentation

## 2023-12-20 DIAGNOSIS — Z1379 Encounter for other screening for genetic and chromosomal anomalies: Secondary | ICD-10-CM | POA: Insufficient documentation

## 2023-12-29 ENCOUNTER — Encounter: Payer: Self-pay | Admitting: Genetic Counselor

## 2024-02-07 ENCOUNTER — Ambulatory Visit: Payer: PPO | Admitting: Internal Medicine

## 2024-03-06 ENCOUNTER — Ambulatory Visit: Admitting: Internal Medicine

## 2024-04-21 ENCOUNTER — Ambulatory Visit: Admitting: Internal Medicine

## 2024-06-07 ENCOUNTER — Encounter: Payer: Self-pay | Admitting: Emergency Medicine

## 2024-07-30 NOTE — Progress Notes (Unsigned)
  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  [facility] Providers Cardiologist:  None Electrophysiologist:  Donnice DELENA Primus, MD  (prior Dr. 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  She was last seen by Dr. Fernande on 02/09/2023 and was having infrequent atrial ectopy without any sustained episodes.  She had 2 transient neurological events.  1 of which was associated with dizziness and she was evaluated at [facility] with MRI and additional testing all of which was unremarkable.  The other episode was when she was hiking and ran out of water and had right arm weakness which resolved with sitting down and increase fluid intake.  Since her last visit she has no complaints.  She is very active and walks 4 to 5 miles several times a week.  She does moderate exercise with weightlifting and still dances ballroom.  ROS: none  Studies Reviewed  ECG 07/31/24: NSR/sinus arrhythmia 91, PR 272, QRS 82, QT/c 364/447  TTE Result date: 03/01/18 - Left ventricle: The cavity size was normal. Wall thickness was normal. Systolic function was normal. The estimated ejection fraction was in the range of 60% to 65%. Wall motion was normal; there were no regional wall motion abnormalities. Left ventricular diastolic function parameters were normal. - Mitral valve: Mildly thickened leaflets . - Pericardium, extracardiac: A trivial pericardial effusion was identified.     Physical Exam VS:  BP 138/66   Pulse (!) 53   Ht 5' 6 (1.676 m)   Wt 129 lb 6.4 oz (58.7 kg)   SpO2 98%   BMI 20.89 kg/m        Wt Readings from Last 3 Encounters:  07/31/24  129 lb 6.4 oz (58.7 kg)  02/09/23 120 lb (54.4 kg)  12/08/22 124 lb 8 oz (56.5 kg)    GEN: Well nourished, well developed in no acute distress NECK: No JVD; No carotid bruits CARDIAC: irregular rhythm, regular rate, no murmurs, rubs, gallops RESPIRATORY:  Clear to auscultation without rales, wheezing or rhonchi  ABDOMEN: Soft, non-tender, non-distended EXTREMITIES:  No edema; No deformity   ASSESSMENT AND PLAN Whitney Brandy, 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  Paroxysmal AT Sinus arrhythmia She continues to be asymptomatic and has no exercise limitations related to her sinus arrhythmia or paroxysmal atrial tachycardia.  She has had no symptomatic atrial tachycardia recently.  She is on no nodal blocking agents or antiarrhythmics.  She has never had documented atrial fibrillation or atrial flutter in the past.  ECG reviewed and similar to prior.  Lipids ordered this week.  A total of 30 minutes was spent preparing for the patient, reviewing history, performing exam, document encounter, coordinating care and counseling the patient. 15 minutes was spent with direct patient care.   Dispo: 1 yr f/u   Signed, Donnice DELENA Primus, MD

## 2024-07-31 ENCOUNTER — Encounter: Payer: Self-pay | Admitting: Student in an Organized Health Care Education/Training Program

## 2024-07-31 ENCOUNTER — Ambulatory Visit
Attending: Student in an Organized Health Care Education/Training Program | Admitting: Student in an Organized Health Care Education/Training Program

## 2024-07-31 VITALS — BP 138/66 | HR 53 | Ht 66.0 in | Wt 129.4 lb

## 2024-07-31 DIAGNOSIS — E785 Hyperlipidemia, unspecified: Secondary | ICD-10-CM

## 2024-07-31 DIAGNOSIS — I4719 Other supraventricular tachycardia: Secondary | ICD-10-CM

## 2024-07-31 NOTE — Patient Instructions (Signed)
 Medication Instructions:  Your physician recommends that you continue on your current medications as directed. Please refer to the Current Medication list given to you today.  *If you need a refill on your cardiac medications before your next appointment, please call your pharmacy*  Lab Work: Lipid Panel If you have labs (blood work) drawn today and your tests are completely normal, you will receive your results only by: MyChart Message (if you have MyChart) OR A paper copy in the mail If you have any lab test that is abnormal or we need to change your treatment, we will call you to review the results.  Testing/Procedures: None ordered.   Follow-Up: At [facility], you and your health needs are our priority.  As part of our continuing mission to provide you with exceptional heart care, our providers are all part of one team.  This team includes your primary Cardiologist (physician) and Advanced Practice Providers or APPs (Physician Assistants and Nurse Practitioners) who all work together to provide you with the care you need, when you need it.  Your next appointment:   12 months

## 2024-08-03 LAB — LIPID PANEL

## 2024-08-04 LAB — LIPID PANEL
Cholesterol, Total: 270 mg/dL — AB (ref 100–199)
HDL: 56 mg/dL (ref >39)
LDL CALC COMMENT:: 4.8 ratio — AB (ref 0.0–4.4)
LDL Chol Calc (NIH): 188 mg/dL — AB (ref 0–99)
Triglycerides: 144 mg/dL (ref 0–149)
VLDL Cholesterol Cal: 26 mg/dL (ref 5–40)

## 2024-08-07 ENCOUNTER — Ambulatory Visit: Payer: Self-pay

## 2024-08-11 ENCOUNTER — Telehealth: Payer: Self-pay | Admitting: Student in an Organized Health Care Education/Training Program

## 2024-08-11 NOTE — Telephone Encounter (Signed)
 Patient called to report to lab values as requested by Dr. Almetta  2019  Cholesterol 229 HDL 71 LDL 126 Ratio 3.2 LPa 30

## 2024-11-28 ENCOUNTER — Ambulatory Visit: Admitting: Internal Medicine

## 2024-12-21 ENCOUNTER — Ambulatory Visit: Admitting: Student in an Organized Health Care Education/Training Program

## 2024-12-28 ENCOUNTER — Ambulatory Visit: Admitting: Internal Medicine
